# Patient Record
Sex: Male | Born: 1960 | Race: White | Hispanic: No | Marital: Married | Smoking: Never smoker
Health system: Southern US, Community
[De-identification: ages and names within clinical notes are randomized; demographics above are authoritative.]

## PROBLEM LIST (undated history)

## (undated) DIAGNOSIS — F419 Anxiety disorder, unspecified: Secondary | ICD-10-CM

## (undated) DIAGNOSIS — T4145XA Adverse effect of unspecified anesthetic, initial encounter: Secondary | ICD-10-CM

## (undated) DIAGNOSIS — M109 Gout, unspecified: Secondary | ICD-10-CM

## (undated) DIAGNOSIS — T8859XA Other complications of anesthesia, initial encounter: Secondary | ICD-10-CM

## (undated) DIAGNOSIS — I10 Essential (primary) hypertension: Secondary | ICD-10-CM

## (undated) DIAGNOSIS — F319 Bipolar disorder, unspecified: Secondary | ICD-10-CM

## (undated) DIAGNOSIS — G629 Polyneuropathy, unspecified: Secondary | ICD-10-CM

## (undated) DIAGNOSIS — Z9289 Personal history of other medical treatment: Secondary | ICD-10-CM

## (undated) DIAGNOSIS — K219 Gastro-esophageal reflux disease without esophagitis: Secondary | ICD-10-CM

## (undated) DIAGNOSIS — C189 Malignant neoplasm of colon, unspecified: Secondary | ICD-10-CM

## (undated) DIAGNOSIS — E119 Type 2 diabetes mellitus without complications: Secondary | ICD-10-CM

## (undated) HISTORY — DX: Essential (primary) hypertension: I10

## (undated) HISTORY — DX: Malignant neoplasm of colon, unspecified: C18.9

## (undated) HISTORY — DX: Polyneuropathy, unspecified: G62.9

## (undated) HISTORY — DX: Gastro-esophageal reflux disease without esophagitis: K21.9

## (undated) HISTORY — DX: Bipolar disorder, unspecified: F31.9

---

## 2011-07-11 ENCOUNTER — Ambulatory Visit (INDEPENDENT_AMBULATORY_CARE_PROVIDER_SITE_OTHER): Payer: BC Managed Care – PPO | Admitting: Surgery

## 2013-05-02 ENCOUNTER — Other Ambulatory Visit: Payer: Self-pay | Admitting: Family Medicine

## 2013-05-02 ENCOUNTER — Ambulatory Visit
Admission: RE | Admit: 2013-05-02 | Discharge: 2013-05-02 | Disposition: A | Discharge status: Home / Self Care | Payer: BC Managed Care – PPO | Source: Ambulatory Visit | Attending: Family Medicine | Admitting: Family Medicine

## 2013-05-02 DIAGNOSIS — K439 Ventral hernia without obstruction or gangrene: Secondary | ICD-10-CM

## 2013-05-02 DIAGNOSIS — K469 Unspecified abdominal hernia without obstruction or gangrene: Secondary | ICD-10-CM

## 2013-05-02 MED ORDER — IOHEXOL 300 MG/ML  SOLN: 125.0000 mL | Freq: Once | INTRAMUSCULAR | Status: AC | PRN

## 2013-05-10 ENCOUNTER — Encounter (INDEPENDENT_AMBULATORY_CARE_PROVIDER_SITE_OTHER): Payer: Self-pay | Admitting: General Surgery

## 2013-05-10 ENCOUNTER — Ambulatory Visit (INDEPENDENT_AMBULATORY_CARE_PROVIDER_SITE_OTHER): Payer: Federal, State, Local not specified - PPO | Admitting: General Surgery

## 2013-05-10 VITALS — BP 178/98 | HR 70 | Temp 96.9°F | Resp 15 | Ht 70.0 in | Wt 297.2 lb

## 2013-05-10 DIAGNOSIS — K439 Ventral hernia without obstruction or gangrene: Secondary | ICD-10-CM

## 2013-05-10 NOTE — Progress Notes (Signed)
Patient ID: John Hubbard, male   DOB: 08-Dec-1960, 52 y.o.   MRN: [id number]  Chief Complaint  Patient presents with  . New Evaluation    eval abd wall hernia    HPI John Hubbard is a 52 y.o. male.  The patient is a 52 year old male who is referred by Dr. Clarene Duke secondary to incarcerated umbilical hernia as well as some rectal bleeding. The patient states that he's had the hernia for several months. He states most recently it became cellulitic at the umbilicus.he was started on a on a trial of Keflex which helped.  The patient also underwent a CT scan which revealed an incarcerated umbilical hernia x2.  Of note patient also states that he has had some rectal bleeding. This is been put off as hemorrhoids. The patient has not undergone a screening colonoscopy since turning 48. He has no family history of cancer family.    HPI  History reviewed. No pertinent past medical history.  History reviewed. No pertinent past surgical history.  History reviewed. No pertinent family history.  Social History History  Substance Use Topics  . Smoking status: Never Smoker   . Smokeless tobacco: Never Used  . Alcohol Use: Yes     Comment: couple times weekly    Allergies  Allergen Reactions  . Penicillins Hives    Current Outpatient Prescriptions  Medication Sig Dispense Refill  . carbamazepine (TEGRETOL) 200 MG tablet Take 200 mg by mouth 2 (two) times daily.      . cephALEXin (KEFLEX) 500 MG capsule Take 500 mg by mouth 4 (four) times daily.      . colchicine (COLCRYS) 0.6 MG tablet Take 0.6 mg by mouth daily.       No current facility-administered medications for this visit.    Review of Systems Review of Systems  Constitutional: Negative.   HENT: Negative.   Respiratory: Negative.   Cardiovascular: Negative.   Gastrointestinal: Negative.   Neurological: Negative.   Hematological: Negative.   Psychiatric/Behavioral: Negative.   All other systems reviewed and are  negative.    Blood pressure 178/98, pulse 70, temperature 96.9 F (36.1 C), temperature source Temporal, resp. rate 15, height 5\' 10"  (1.778 m), weight 297 lb 3.2 oz (134.809 kg).  Physical Exam Physical Exam  Constitutional: He is oriented to person, place, and time. He appears well-developed and well-nourished.  HENT:  Head: Normocephalic and atraumatic.  Eyes: Conjunctivae and EOM are normal. Pupils are equal, round, and reactive to light.  Neck: Normal range of motion. Neck supple.  Cardiovascular: Normal rate, regular rhythm and normal heart sounds.   Pulmonary/Chest: Effort normal and breath sounds normal.  Abdominal: Bowel sounds are normal. A hernia is present. Hernia confirmed positive in the ventral area.    2 ventral hernias incarcerated  Musculoskeletal: Normal range of motion.  Neurological: He is alert and oriented to person, place, and time.  Skin: Skin is warm and dry.    Data Reviewed none  Assessment    This 52 year old male  With a symptomatic incarcerated ventral hernia x2. Patient also with rectal bleeding.     Plan    1. We'll proceed to the operating room for laparoscopic ventral hernia repair with mesh. 2.All risks and benefits were discussed with the patient, to generally include infection, bleeding, damage to surrounding structures, acute and chronic nerve pain, and recurrence. Alternatives were offered and described.  All questions were answered and the patient voiced understanding of the procedure and wishes to proceed  at this point. 3. We'll have the patient follow up with Dr. Maisie Fus for consultation of a screening colonoscopy.        Marigene Ehlers., John Hubbard 05/10/2013, 4:44 PM

## 2013-05-13 ENCOUNTER — Telehealth (INDEPENDENT_AMBULATORY_CARE_PROVIDER_SITE_OTHER): Payer: Self-pay

## 2013-05-13 NOTE — Telephone Encounter (Signed)
Pt called stating he was contacted [id number] up another consult with our office. Pt states he already had consult. From review of Dr Derrell Lolling note pt was to also have colonoscopy by Dr Maisie Fus in the future. I did not see note of who in our office was calling pt. Pt states he did not get name. Pt was understanding they would proceed with hernia repair and do colonoscopy at a later date. I advised pt I will send msg to Assistant that was working with Dr Derrell Lolling Friday and to [facility] to determine if pt is to have colonoscopy prior to hernia repair. Pt can be reached [phone].

## 2013-05-13 NOTE — Telephone Encounter (Signed)
Returned patient's call to let him know that since all the OR scheduling offices were closed on Friday that they would be calling him back either today or tomorrow to set up the surgery date and to clarify that the appt with [facility] for colonoscopy consult would not affect his hernia surgery being schedule per [facility].Marland Kitchentold patient that he could call and ask for me if he hadn't heard anything from the schedulers as of tomorrow...patient was pleased and agreeable with POC at this time.Marland Kitchen

## 2013-05-16 ENCOUNTER — Ambulatory Visit (INDEPENDENT_AMBULATORY_CARE_PROVIDER_SITE_OTHER): Payer: BC Managed Care – PPO | Admitting: General Surgery

## 2013-05-20 ENCOUNTER — Telehealth (INDEPENDENT_AMBULATORY_CARE_PROVIDER_SITE_OTHER): Payer: Self-pay | Admitting: General Surgery

## 2013-05-20 NOTE — Telephone Encounter (Signed)
Per patient he will bring his insurance card by so that we can bill insurance and set up surgery at that time.

## 2013-06-05 ENCOUNTER — Ambulatory Visit (INDEPENDENT_AMBULATORY_CARE_PROVIDER_SITE_OTHER): Payer: Federal, State, Local not specified - PPO | Admitting: General Surgery

## 2013-06-05 ENCOUNTER — Encounter (INDEPENDENT_AMBULATORY_CARE_PROVIDER_SITE_OTHER): Payer: Self-pay | Admitting: General Surgery

## 2013-06-05 VITALS — BP 138/82 | HR 96 | Resp 18 | Ht 70.0 in | Wt 295.8 lb

## 2013-06-05 DIAGNOSIS — Z1211 Encounter for screening for malignant neoplasm of colon: Secondary | ICD-10-CM

## 2013-06-05 NOTE — Patient Instructions (Signed)
CENTRAL  SURGERY  ONE-DAY (1) PRE-OP HOME COLON PREP INSTRUCTIONS: ** MIRALAX / GATORADE PREP **  You must follow the instructions below carefully.  If you have questions or problems, please call and speak to someone in the clinic department at our office:   [phone].     INSTRUCTIONS: 1. Five days prior to your procedure do not eat nuts, popcorn, or fruit with seeds.  Stop all fiber supplements such as Metamucil, Citrucel, etc. 2. Two days before surgery fill the prescription at a pharmacy of your choice and purchase the additional supplies below.         MIRALAX - GATORADE -- DULCOLAX TABS:   Purchase a bottle of MIRALAX  (255 gm bottle)    In addition, purchase four (4) DULCOLAX TABLETS (no prescription required- ask the pharmacist if you can't find them)    Purchase one 64 oz GATORADE.  (Do NOT purchase red Gatorade; any other flavor is acceptable) and place in refrigerator to get cold.  3.   Day Before Surgery:   6 am: take the 4 Dulcolax tablets   You may only have clear liquids (tea, coffee, juice, broth, jello, soft drinks, gummy bears).  You cannot have solid foods, cream, milk or milk products.  Drink at lease 8 ounces of liquids every hour while awake.   Mix the entire bottle of MiraLax and the Gatorade in a large container.    10:00am: Begin drinking the Gatorade mixture until gone (8 oz every 15-30 minutes).      You may suck on a lime wedge or hard candy to "freshen your palate" in between glasses   If you are a diabetic, take your blood sugar reading several time throughout the prep.  Have some juice available to take if your sugar level gets too low   You may feel chilled while taking the prep.  Have some warm tea or broth to help warm up.   Continue clear liquids until midnight or bedtime  3. The day of your procedure:   Do not eat or drink ANYTHING after midnight before your surgery.     If you take Heart or Blood Pressure medicine, ask the pre-op nurses about  these during your preop appointment.   Further pre-operative instructions will be given to you from the hospital.   Expect to be contacted 5-7 days before your surgery.  

## 2013-06-05 NOTE — Progress Notes (Signed)
Chief Complaint  Patient presents with  . New Evaluation    referal by Dr Alinda Sierras talk about colonoscopy    HISTORY: John Hubbard is a 52 y.o. male who presents to the office requesting a screening colonoscopy.  He has regular BM's and does occasionally see some blood around his stools.    past medical history.  Gout, hernia  History reviewed. No pertinent past surgical history.      Current Outpatient Prescriptions  Medication Sig Dispense Refill  . allopurinol (ZYLOPRIM) 300 MG tablet       . carbamazepine (TEGRETOL) 200 MG tablet Take 200 mg by mouth 2 (two) times daily.      . cephALEXin (KEFLEX) 500 MG capsule Take 500 mg by mouth 4 (four) times daily.      . colchicine (COLCRYS) 0.6 MG tablet Take 0.6 mg by mouth daily.       No current facility-administered medications for this visit.      Allergies  Allergen Reactions  . Penicillins Hives      History reviewed. No pertinent family history. No h/o colon cancer History   Social History  . Marital Status: Married    Spouse Name: N/A    Number of Children: N/A  . Years of Education: N/A   Social History Main Topics  . Smoking status: Never Smoker   . Smokeless tobacco: Never Used  . Alcohol Use: Yes     Comment: couple times weekly  . Drug Use: No  . Sexual Activity: None   Other Topics Concern  . None   Social History Narrative  . None      REVIEW OF SYSTEMS - PERTINENT POSITIVES ONLY: Review of Systems - General ROS: negative for - chills, fever or weight loss Hematological and Lymphatic ROS: negative for - bleeding problems, blood clots or bruising Respiratory ROS: no cough, shortness of breath, or wheezing Cardiovascular ROS: no chest pain or dyspnea on exertion Gastrointestinal ROS: positive for - blood around stools negative for - abdominal pain, change in bowel habits, change in stools, constipation or diarrhea Genito-Urinary ROS: no dysuria, trouble voiding, or hematuria  EXAM: Filed Vitals:   06/05/13 1351  BP: 138/82  Pulse: 96  Resp: 18    General appearance: alert and cooperative Resp: clear to auscultation bilaterally Cardio: regular rate and rhythm, S1, S2 normal, no murmur, click, rub or gallop and regular rate and rhythm GI: soft, non-tender; bowel sounds normal; no masses,  no organomegaly      ASSESSMENT AND PLAN: John Hubbard is a 52 y.o. M with average risk for colon cancer.  We will plan on performing a screening colonoscopy.    Vanita Panda, MD Colon and Rectal Surgery / General Surgery [facility]      Visit Diagnoses: 1. Screening for colon cancer     Primary Care Physician: Aida Puffer, MD

## 2013-06-06 ENCOUNTER — Telehealth (INDEPENDENT_AMBULATORY_CARE_PROVIDER_SITE_OTHER): Payer: Self-pay | Admitting: General Surgery

## 2013-06-06 ENCOUNTER — Ambulatory Visit (INDEPENDENT_AMBULATORY_CARE_PROVIDER_SITE_OTHER): Payer: Self-pay | Admitting: General Surgery

## 2013-06-06 NOTE — Telephone Encounter (Signed)
Patient face sheet being held in pending until appointment with Dr. Toney Reil

## 2013-06-14 ENCOUNTER — Telehealth (INDEPENDENT_AMBULATORY_CARE_PROVIDER_SITE_OTHER): Payer: Self-pay | Admitting: *Deleted

## 2013-06-14 NOTE — Telephone Encounter (Signed)
I s/w Mr. Strine.  I told him we'll just keep his surgery scheudled and see how he is the AM of surgery.  HE was OK with that and will con' with his Albuterol tx.

## 2013-06-14 NOTE — Telephone Encounter (Signed)
Patient called this morning to report that he has been having some issues with bronchitis for the last few days.  Patient is treating it with Albuterol and does feel he is getting better over the last 2 days.  Patient is scheduled for Umbilical hernia on 06/19/13 and just wanted to make sure this would not effect his surgery. Explained a message will be sent to Dr. Derrell Lolling to ask his opinion on this then we will call him back.  Patient states understanding and agreeable at this time.

## 2013-06-19 ENCOUNTER — Other Ambulatory Visit (INDEPENDENT_AMBULATORY_CARE_PROVIDER_SITE_OTHER): Payer: Self-pay | Admitting: *Deleted

## 2013-06-19 DIAGNOSIS — K436 Other and unspecified ventral hernia with obstruction, without gangrene: Secondary | ICD-10-CM

## 2013-06-19 HISTORY — PX: HERNIA REPAIR: SHX51

## 2013-06-19 MED ORDER — OXYCODONE-ACETAMINOPHEN 5-325 MG PO TABS: 1.0000 | ORAL_TABLET | ORAL | Status: DC | PRN

## 2013-06-20 ENCOUNTER — Telehealth (INDEPENDENT_AMBULATORY_CARE_PROVIDER_SITE_OTHER): Payer: Self-pay

## 2013-06-20 NOTE — Telephone Encounter (Signed)
Dr Derrell Lolling did his surgery.  I will get the message to him.

## 2013-06-20 NOTE — Telephone Encounter (Signed)
Patient wife called in stating there was a little discharge from incision wound. The drainage was clear with a little blood. It is not oozing out but is just having spots of drainage on guaze. Patient denies any n/v, fever, redness or abnormal swelling. Drainage seems to come when patient coughs or moves a lot. They will continue to monitor area and call back if drainage increases or pus starts to drain from area. They will call is patient starts running fever, or has n/v.

## 2013-06-21 NOTE — Telephone Encounter (Signed)
Patient called in today to report that he has bronchitis and has been coughing really hard.  Patient states he thinks he "popped" a stitch in the lower incision site.  Spoke to Dr. Derrell Lolling who instructed patient to just keep a close eye on it and call if he see's any signs of pus drainage, redness, or fever.  Also encouraged patient to use a pillow to splint the abdomen with when coughing.  Patient states understanding and agreeable at this time.

## 2013-07-03 ENCOUNTER — Other Ambulatory Visit (INDEPENDENT_AMBULATORY_CARE_PROVIDER_SITE_OTHER): Payer: Self-pay | Admitting: General Surgery

## 2013-07-03 ENCOUNTER — Encounter (INDEPENDENT_AMBULATORY_CARE_PROVIDER_SITE_OTHER): Payer: Self-pay | Admitting: General Surgery

## 2013-07-03 ENCOUNTER — Ambulatory Visit (INDEPENDENT_AMBULATORY_CARE_PROVIDER_SITE_OTHER): Payer: Federal, State, Local not specified - PPO | Admitting: General Surgery

## 2013-07-03 VITALS — BP 132/82 | HR 77 | Resp 16 | Ht 70.0 in | Wt 296.8 lb

## 2013-07-03 DIAGNOSIS — Z09 Encounter for follow-up examination after completed treatment for conditions other than malignant neoplasm: Secondary | ICD-10-CM

## 2013-07-03 NOTE — Progress Notes (Signed)
Patient ID: John Hubbard, male   DOB: [birth year], 52 y.o.   MRN: [id number] The patient is a 52 year old male status post laparoscopic umbilical hernia repair with mesh. The patient has been doing well postoperatively. She's had a nagging cough is slowly getting over at this time. He states he noticed some soreness in his abdomen is feeling better. He states that he is beginning to walk at this time and beginning to increase his activities.   On exam: Wounds are clean dry and intact, and there is no hernia on palpation   Assessment and plan: 52 year old male status post laparoscopic hernia repair mesh 1. We discussed no heavy lifting for another month. 2. The patient follow up as needed 3. We'll have patient set up with Dr. Maisie Fus for screening colonoscopy.

## 2013-07-24 ENCOUNTER — Encounter (HOSPITAL_COMMUNITY)
Admission: RE | Disposition: A | Discharge status: Home / Self Care | Payer: Self-pay | Source: Ambulatory Visit | Attending: General Surgery

## 2013-07-24 ENCOUNTER — Ambulatory Visit (HOSPITAL_COMMUNITY)
Admission: RE | Admit: 2013-07-24 | Discharge: 2013-07-24 | Disposition: A | Discharge status: Home / Self Care | Payer: Federal, State, Local not specified - PPO | Source: Ambulatory Visit | Attending: General Surgery | Admitting: General Surgery

## 2013-07-24 ENCOUNTER — Encounter (HOSPITAL_COMMUNITY): Payer: Self-pay | Admitting: *Deleted

## 2013-07-24 DIAGNOSIS — D126 Benign neoplasm of colon, unspecified: Secondary | ICD-10-CM

## 2013-07-24 DIAGNOSIS — C187 Malignant neoplasm of sigmoid colon: Secondary | ICD-10-CM

## 2013-07-24 DIAGNOSIS — Z79899 Other long term (current) drug therapy: Secondary | ICD-10-CM | POA: Insufficient documentation

## 2013-07-24 DIAGNOSIS — K625 Hemorrhage of anus and rectum: Secondary | ICD-10-CM | POA: Insufficient documentation

## 2013-07-24 HISTORY — PX: COLONOSCOPY: SHX5424

## 2013-07-24 SURGERY — COLONOSCOPY
Anesthesia: Moderate Sedation | Laterality: Bilateral

## 2013-07-24 MED ORDER — METOPROLOL TARTRATE 1 MG/ML IV SOLN
5.0000 mg | INTRAVENOUS | Status: AC
  Administered 2013-07-24: 5 mg via INTRAVENOUS

## 2013-07-24 MED ORDER — SPOT INK MARKER SYRINGE KIT
PACK | SUBMUCOSAL | Status: DC | PRN
  Administered 2013-07-24: 4 mL via SUBMUCOSAL

## 2013-07-24 MED ORDER — FENTANYL CITRATE 0.05 MG/ML IJ SOLN
INTRAMUSCULAR | Status: AC
  Filled 2013-07-24: qty 2

## 2013-07-24 MED ORDER — FENTANYL CITRATE 0.05 MG/ML IJ SOLN
INTRAMUSCULAR | Status: DC | PRN
  Administered 2013-07-24: 25 ug via INTRAVENOUS
  Administered 2013-07-24 (×3): 12.5 ug via INTRAVENOUS
  Administered 2013-07-24: 25 ug via INTRAVENOUS
  Administered 2013-07-24: 12.5 ug via INTRAVENOUS

## 2013-07-24 MED ORDER — MIDAZOLAM HCL 10 MG/2ML IJ SOLN
INTRAMUSCULAR | Status: DC | PRN
  Administered 2013-07-24: .5 mg via INTRAVENOUS
  Administered 2013-07-24: 2 mg via INTRAVENOUS
  Administered 2013-07-24: .5 mg via INTRAVENOUS
  Administered 2013-07-24: 2 mg via INTRAVENOUS

## 2013-07-24 MED ORDER — SPOT INK MARKER SYRINGE KIT
PACK | SUBMUCOSAL | Status: AC
  Filled 2013-07-24: qty 5

## 2013-07-24 MED ORDER — MIDAZOLAM HCL 10 MG/2ML IJ SOLN
INTRAMUSCULAR | Status: AC
  Filled 2013-07-24: qty 2

## 2013-07-24 MED ORDER — METOPROLOL TARTRATE 1 MG/ML IV SOLN
INTRAVENOUS | Status: AC
  Filled 2013-07-24: qty 5

## 2013-07-24 MED ORDER — SODIUM CHLORIDE 0.9 % IV SOLN
INTRAVENOUS | Status: DC
  Administered 2013-07-24 (×2): 500 mL via INTRAVENOUS

## 2013-07-24 MED ORDER — GLUCAGON HCL (RDNA) 1 MG IJ SOLR
INTRAMUSCULAR | Status: AC
  Filled 2013-07-24: qty 1

## 2013-07-24 NOTE — H&P (Signed)
  HISTORY: John Hubbard is a 52 y.o. male who presented to the office requesting a screening colonoscopy. He has regular BM's and does occasionally see some blood around his stools. This has been better since his hernia repair.    past medical history. Gout, hernia   past surgical history.  IHR  Current Outpatient Prescriptions   Medication  Sig  Dispense  Refill   .  allopurinol (ZYLOPRIM) 300 MG tablet      .  carbamazepine (TEGRETOL) 200 MG tablet  Take 200 mg by mouth 2 (two) times daily.     .  cephALEXin (KEFLEX) 500 MG capsule  Take 500 mg by mouth 4 (four) times daily.     .  colchicine (COLCRYS) 0.6 MG tablet  Take 0.6 mg by mouth daily.      No current facility-administered medications for this visit.    Allergies   Allergen  Reactions   .  Penicillins  Hives    History reviewed. No pertinent family history.  No h/o colon cancer  History    Social History   .  Marital Status:  Married     Spouse Name:  N/A     Number of Children:  N/A   .  Years of Education:  N/A    Social History Main Topics   .  Smoking status:  Never Smoker   .  Smokeless tobacco:  Never Used   .  Alcohol Use:  Yes      Comment: couple times weekly   .  Drug Use:  No   .  Sexual Activity:  None    Other Topics  Concern   .  None    Social History Narrative   .  None    REVIEW OF SYSTEMS - PERTINENT POSITIVES ONLY:  Review of Systems - General ROS: negative for - chills, fever or weight loss  Hematological and Lymphatic ROS: negative for - bleeding problems, blood clots or bruising  Respiratory ROS: no cough, shortness of breath, or wheezing  Cardiovascular ROS: no chest pain or dyspnea on exertion  Gastrointestinal ROS: positive for - blood around stools  negative for - abdominal pain, change in bowel habits, change in stools, constipation or diarrhea  Genito-Urinary ROS: no dysuria, trouble voiding, or hematuria \  EXAM:   General appearance: alert and cooperative  Resp: clear  to auscultation bilaterally  Cardio: regular rate and rhythm, S1, S2 normal, no murmur, click, rub or gallop and regular rate and rhythm  GI: soft, non-tender; bowel sounds normal; no masses, no organomegaly   ASSESSMENT AND PLAN:  John Hubbard is a 52 y.o. M with average risk for colon cancer. We will plan on performing a screening colonoscopy.  Risks and benefits discussed.  Risks include bleeding and perforation.

## 2013-07-24 NOTE — Op Note (Signed)
[facility] [address] [place], [address]   COLONOSCOPY PROCEDURE REPORT  PATIENT: Kouper, Spinella  MR#: [id number] BIRTHDATE: 09/09/1961 , 52  yrs. old GENDER: Male ENDOSCOPIST: Vanita Panda, MD REFERRED NW:GNFAO Little, M.D. PROCEDURE DATE:  07/24/2013 PROCEDURE:   Colonoscopy, diagnostic ASA CLASS:   Class II INDICATIONS:Rectal Bleeding. MEDICATIONS: Fentanyl 100 mcg IV and Versed 5 mg IV  DESCRIPTION OF PROCEDURE:   After the risks benefits and alternatives of the procedure were thoroughly explained, informed consent was obtained.  A digital rectal exam revealed no rectal mass.   The Pentax Adult Colonscope [id number]  endoscope was introduced through the anus and advanced to the cecum, which was identified by both the appendix and ileocecal valve. No adverse events experienced.   The quality of the prep was good, using MiraLax  The instrument was then slowly withdrawn as the colon was fully examined.    Findings: Sigmoid mass concerning for cancer.  Multiple polyps.  COLON FINDINGS: A polypoid shaped pedunculated polyp measuring 5 mm in size was found at the cecum.  A polypectomy was performed using snare cautery.  The resection was complete (piecemeal) and the polyp tissue was completely retrieved.   A polypoid shaped pedunculated polyp measuring 3 mm in size was found in the left colon.  This was not biopsied because it was 5 cm distal to his sigmoid mass.   A one-third circumferential ulcerated and firm mass, measuring 3 X 5cm in size, was found in the sigmoid colon. Multiple biopsies of the lesion were performed using cold forceps. Multiple polypoid shaped pedunculated polyps ranging between 3-18mm in size were found in the descending colon.  A polypectomy was performed using snare cautery for all 3.  The resection was complete and the polyp tissue was completely retrieved. 2 of these were retrieved with the basket.  The sigmoid mass was marked  with SPOT in multiple locations proximal to sigmoid mass and distal to sigmoid polyp. Retroflexed views revealed no abnormalities. Withdrawal time was 70 minutes.  The scope was withdrawn and the procedure completed.  ENDOSCOPIC IMPRESSION:     1.   Pedunculated polyp measuring 5 mm in size was found at the cecum; polypectomy was performed using snare cautery 2.   Pedunculated polyp measuring 3 mm in size was found in the left colon 3.   One-third circumferential mass, measuring 3 X 5cm in size, were found in the sigmoid colon; multiple biopsies of the lesion were performed 4.   Multiple pedunculated polyps ranging between 3-45mm in size were found in the descending colon; polypectomy was performed using snare cautery   RECOMMENDATIONS:     Await biopsy results   eSigned:  Vanita Panda, MD 07/24/2013 1:06 PM   cc: Aida Puffer, MD   PATIENT NAME:  Niguel, Moure MR#: [id number]

## 2013-07-25 ENCOUNTER — Encounter (HOSPITAL_COMMUNITY): Payer: Self-pay | Admitting: General Surgery

## 2013-07-25 ENCOUNTER — Other Ambulatory Visit (INDEPENDENT_AMBULATORY_CARE_PROVIDER_SITE_OTHER): Payer: Self-pay | Admitting: General Surgery

## 2013-07-25 ENCOUNTER — Telehealth (INDEPENDENT_AMBULATORY_CARE_PROVIDER_SITE_OTHER): Payer: Self-pay | Admitting: General Surgery

## 2013-07-25 ENCOUNTER — Telehealth (INDEPENDENT_AMBULATORY_CARE_PROVIDER_SITE_OTHER): Payer: Self-pay | Admitting: *Deleted

## 2013-07-25 DIAGNOSIS — C189 Malignant neoplasm of colon, unspecified: Secondary | ICD-10-CM

## 2013-07-25 NOTE — Telephone Encounter (Signed)
I called pt and informed him of his appt for his CT scan at [facility] on 07/29/13 with an arrival time of 2:45pm.  I instructed pt to go today or tomorrow to pick up the contrast and they would instruct him on when to drink.  Also I informed pt that [facility] had moved pts appt with Dr. Maisie Fus to 07/31/13 at 9:40am.  Pt agreeable.

## 2013-07-25 NOTE — Progress Notes (Signed)
Tests to be arranged and appointment moved up

## 2013-07-25 NOTE — Telephone Encounter (Signed)
Discussed results with patient.  Will proceed with metastatic workup.

## 2013-07-26 ENCOUNTER — Telehealth (INDEPENDENT_AMBULATORY_CARE_PROVIDER_SITE_OTHER): Payer: Self-pay

## 2013-07-26 NOTE — Telephone Encounter (Signed)
Yes, I'd like to get a repeat Abd/pelvis CT with the Chest CT

## 2013-07-26 NOTE — Telephone Encounter (Signed)
I called and notified the pt.  We will keep it scheduled for Monday

## 2013-07-26 NOTE — Telephone Encounter (Signed)
The pt called to report he just had a ct scan and wanted to make sure Dr Maisie Fus knows that and does he still need to get one Monday.  I reviewed his chart and the CT was in July.  It has been 3 months and there has been a change so I told him she would want it.  If Dr Maisie Fus decides she doesn't I said it was the abdomen and pelvis so we will still need the chest done.

## 2013-07-29 ENCOUNTER — Encounter (HOSPITAL_COMMUNITY): Payer: Self-pay

## 2013-07-29 ENCOUNTER — Ambulatory Visit (HOSPITAL_COMMUNITY)
Admission: RE | Admit: 2013-07-29 | Discharge: 2013-07-29 | Disposition: A | Discharge status: Home / Self Care | Payer: Federal, State, Local not specified - PPO | Source: Ambulatory Visit | Attending: General Surgery | Admitting: General Surgery

## 2013-07-29 DIAGNOSIS — C189 Malignant neoplasm of colon, unspecified: Secondary | ICD-10-CM

## 2013-07-29 DIAGNOSIS — C187 Malignant neoplasm of sigmoid colon: Secondary | ICD-10-CM | POA: Insufficient documentation

## 2013-07-29 MED ORDER — IOHEXOL 300 MG/ML  SOLN
100.0000 mL | Freq: Once | INTRAMUSCULAR | Status: AC | PRN
  Administered 2013-07-29: 100 mL via INTRAVENOUS

## 2013-07-30 ENCOUNTER — Other Ambulatory Visit: Payer: Federal, State, Local not specified - PPO

## 2013-07-31 ENCOUNTER — Encounter (INDEPENDENT_AMBULATORY_CARE_PROVIDER_SITE_OTHER): Payer: Self-pay | Admitting: General Surgery

## 2013-07-31 ENCOUNTER — Other Ambulatory Visit (INDEPENDENT_AMBULATORY_CARE_PROVIDER_SITE_OTHER): Payer: Self-pay | Admitting: *Deleted

## 2013-07-31 ENCOUNTER — Ambulatory Visit (INDEPENDENT_AMBULATORY_CARE_PROVIDER_SITE_OTHER): Payer: Federal, State, Local not specified - PPO | Admitting: General Surgery

## 2013-07-31 VITALS — BP 160/98 | HR 88 | Temp 97.6°F | Resp 16 | Ht 70.0 in | Wt 299.8 lb

## 2013-07-31 DIAGNOSIS — C189 Malignant neoplasm of colon, unspecified: Secondary | ICD-10-CM

## 2013-07-31 MED ORDER — METRONIDAZOLE 500 MG PO TABS: 500.0000 mg | ORAL_TABLET | ORAL | Status: DC

## 2013-07-31 NOTE — Patient Instructions (Signed)
CENTRAL [facility]  ONE-DAY (1) PRE-OP HOME COLON PREP INSTRUCTIONS: ** MIRALAX / GATORADE PREP / FLAGYL**  You must follow the instructions below carefully.  If you have questions or problems, please call and speak to someone in the clinic department at our office:   [phone].     INSTRUCTIONS: 1. Five days prior to your procedure do not eat nuts, popcorn, or fruit with seeds.  Stop all fiber supplements such as Metamucil, Citrucel, etc. 2. Two days before surgery fill the prescription at a pharmacy of your choice and purchase the additional supplies below.         MIRALAX - GATORADE -- DULCOLAX TABS -- FLEET ENEMA:   Purchase a bottle of MIRALAX  (255 gm bottle)    In addition, purchase four (4) DULCOLAX TABLETS (no prescription required- ask the pharmacist if you can't find them)   Purchase one Fleet Enema (Green and white box)    Purchase one 64 oz GATORADE.  (Do NOT purchase red Gatorade; any other flavor is acceptable) and place in refrigerator to get cold.  3.   Day Before Surgery:   6 am: Wash you abdomen with soap and repeat this on the morning of surgery and take 4 Dulcolax tablets   You may only have clear liquids (tea, coffee, juice, broth, jello, soft drinks, gummy bears).  You cannot have solid foods, cream, milk or milk products.  Drink at lease 8 ounces of liquids every hour while awake.   Take the Flagyl prescription as directed at 8 am, 2 pm and 8 pm.  It is helpful to take this with some jello instead of on an empty stomach.  Any flavor is ok, except red jello, which will cause red stools.   Mix the entire bottle of MiraLax and the Gatorade in a large container.    10:00am: Begin drinking the Gatorade mixture until gone (8 oz every 15-30 minutes).      You may suck on a lime wedge or hard candy to "freshen your palate" in between glasses   If you are a diabetic, take your blood sugar reading several time throughout the prep.  Have some juice available to take if your  sugar level gets too low   You may feel chilled while taking the prep.  Have some warm tea or broth to help warm up.   Continue clear liquids until midnight or bedtime  3. The day of your procedure:   Do not eat or drink ANYTHING after midnight before your surgery.     If you take Heart or Blood Pressure medicine, ask the pre-op nurses about these during your preop appointment.   Take a regular Fleet Enema one hour before leaving the come to the hospital.  Try to hold it in 5-10 mins before expelling.   Further pre-operative instructions will be given to you from the hospital.   Expect to be contacted 5-7 days before your surgery.     

## 2013-07-31 NOTE — Progress Notes (Signed)
Chief Complaint  Patient presents with  . Routine Post Op    1st po colonoscopy    HISTORY: John Hubbard is a 52 y.o. male who presents to the office with rectal bleeding.  This was found colonoscopy performed for rectal bleeding.  Workup thus far has included CT chest, Abd and Pelvis.   He has a CEA pending.  He denies any cardiac issues.  He can walk up a flight of stairs with DOE.  He denies any chest pain.  He does have signs concerning for OSA.  Past Medical History  Diagnosis Date  . Hypertension       Past Surgical History  Procedure Laterality Date  . Hernia repair  06/19/13    Umbilical Hernia Repair  . Colonoscopy Bilateral 07/24/2013    Procedure: COLONOSCOPY;  Surgeon: Romie Levee, MD;  Location: [facility];  Service: Endoscopy;  Laterality: Bilateral;      Current Outpatient Prescriptions  Medication Sig Dispense Refill  . allopurinol (ZYLOPRIM) 300 MG tablet       . carbamazepine (TEGRETOL) 200 MG tablet Take 200 mg by mouth 2 (two) times daily.      . metroNIDAZOLE (FLAGYL) 500 MG tablet Take 1 tablet (500 mg total) by mouth as directed.  3 tablet  0   No current facility-administered medications for this visit.      Allergies  Allergen Reactions  . Penicillins Hives      History reviewed. No pertinent family history.    History   Social History  . Marital Status: Married    Spouse Name: N/A    Number of Children: N/A  . Years of Education: N/A   Social History Main Topics  . Smoking status: Never Smoker   . Smokeless tobacco: Never Used  . Alcohol Use: Yes     Comment: couple times weekly  . Drug Use: No  . Sexual Activity: None   Other Topics Concern  . None   Social History Narrative  . None       REVIEW OF SYSTEMS - PERTINENT POSITIVES ONLY: Review of Systems - General ROS: negative for - chills or fever Respiratory ROS: no cough, shortness of breath, or wheezing Cardiovascular ROS: no chest pain or dyspnea on  exertion Gastrointestinal ROS: no abdominal pain, change in bowel habits, or black or bloody stools Genito-Urinary ROS: no dysuria, trouble voiding, or hematuria  EXAM: Filed Vitals:   07/31/13 0955  BP: 160/98  Pulse: 88  Temp: 97.6 F (36.4 C)  Resp: 16      Gen:  No acute distress.  Well nourished and well groomed.   Neurological: Alert and oriented to person, place, and time. Coordination normal.  Head: Normocephalic and atraumatic.  Eyes: Conjunctivae are normal. Pupils are equal, round, and reactive to light. No scleral icterus.  Neck: Normal range of motion. Neck supple. No tracheal deviation or thyromegaly present.  No cervical lymphadenopathy. Cardiovascular: Normal rate, regular rhythm, normal heart sounds and intact distal pulses.   Respiratory: Effort normal.  No respiratory distress. No chest wall tenderness. Breath sounds normal.  No wheezes, rales or rhonchi.  GI: Soft. Bowel sounds are normal. The abdomen is soft and nontender.  There is no rebound and no guarding.  Musculoskeletal: Normal range of motion. Extremities are nontender.  Skin: Skin is warm and dry. No rash noted. No diaphoresis. No erythema. No pallor. No clubbing, cyanosis, or edema.   Psychiatric: Normal mood and affect. Behavior is normal. Judgment and thought  content normal.      LABORATORY RESULTS: Available labs are reviewed  CEA: pending  RADIOLOGY RESULTS:   Images and reports are reviewed. CT Chest Abd and pelvis shows no signs of metastatic disease.     ASSESSMENT AND PLAN: BRENNEN CAMPER is a 52 y.o. 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  This was tattooed distally and proximately.  CT shows no signs of metastatic disease.  The surgery and anatomy were described to the patient as well as the risks of surgery and the possible complications.  These include: Bleeding, infection and possible wound complications such as hernia, damage to adjacent structures,  leak of surgical connections, which can lead to other surgeries and possibly an ostomy (5-7%), possible need for other procedures, such as abscess drains in radiology, possible prolonged hospital stay, possible diarrhea from removal of part of the colon, possible constipation from narcotics, prolonged fatigue/weakness or appetite loss, possible early recurrence of cancer, possible complications of their medical problems such as heart disease or arrhythmias or lung problems, death (less than 1%).  We also discussed that he is at slightly higher risk of hernia due to his weight.  I believe the patient understands and wishes to proceed with the surgery.      Vanita Panda, MD Colon and Rectal Surgery / General Surgery [facility]      Visit Diagnoses: 1. Colon cancer     Primary Care Physician: Aida Puffer, MD

## 2013-08-01 ENCOUNTER — Encounter (INDEPENDENT_AMBULATORY_CARE_PROVIDER_SITE_OTHER): Payer: Federal, State, Local not specified - PPO | Admitting: General Surgery

## 2013-08-01 LAB — CEA: CEA: 1.8 ng/mL (ref 0.0–5.0)

## 2013-08-06 ENCOUNTER — Encounter (INDEPENDENT_AMBULATORY_CARE_PROVIDER_SITE_OTHER): Payer: Federal, State, Local not specified - PPO | Admitting: General Surgery

## 2013-08-06 ENCOUNTER — Encounter (HOSPITAL_COMMUNITY): Payer: Self-pay | Admitting: Pharmacy Technician

## 2013-08-08 ENCOUNTER — Other Ambulatory Visit: Payer: Self-pay

## 2013-08-09 ENCOUNTER — Encounter (HOSPITAL_COMMUNITY): Payer: Self-pay

## 2013-08-09 ENCOUNTER — Encounter (HOSPITAL_COMMUNITY)
Admission: RE | Admit: 2013-08-09 | Discharge: 2013-08-09 | Disposition: A | Discharge status: Home / Self Care | Payer: Federal, State, Local not specified - PPO | Source: Ambulatory Visit | Attending: General Surgery | Admitting: General Surgery

## 2013-08-09 ENCOUNTER — Other Ambulatory Visit (HOSPITAL_COMMUNITY): Payer: Self-pay | Admitting: General Surgery

## 2013-08-09 DIAGNOSIS — Z01812 Encounter for preprocedural laboratory examination: Secondary | ICD-10-CM | POA: Insufficient documentation

## 2013-08-09 DIAGNOSIS — Z01810 Encounter for preprocedural cardiovascular examination: Secondary | ICD-10-CM | POA: Insufficient documentation

## 2013-08-09 DIAGNOSIS — Z01818 Encounter for other preprocedural examination: Secondary | ICD-10-CM | POA: Insufficient documentation

## 2013-08-09 HISTORY — DX: Anxiety disorder, unspecified: F41.9

## 2013-08-09 LAB — CBC
HCT: 44.1 % (ref 39.0–52.0)
MCHC: 34.2 g/dL (ref 30.0–36.0)
MCV: 84 fL (ref 78.0–100.0)
RBC: 5.25 MIL/uL (ref 4.22–5.81)
RDW: 12.9 % (ref 11.5–15.5)
WBC: 7.1 10*3/uL (ref 4.0–10.5)

## 2013-08-09 LAB — BASIC METABOLIC PANEL
BUN: 17 mg/dL (ref 6–23)
CO2: 28 mEq/L (ref 19–32)
Chloride: 101 mEq/L (ref 96–112)
Creatinine, Ser: 0.81 mg/dL (ref 0.50–1.35)
Sodium: 138 mEq/L (ref 135–145)

## 2013-08-09 LAB — ABO/RH: ABO/RH(D): O POS

## 2013-08-09 NOTE — Patient Instructions (Signed)
20 DEMAURI ADVINCULA  08/09/2013   Your procedure is scheduled on:  08/14/13  [facility]  Report to [facility] at    0630   AM.  Call this number if you have problems the morning of surgery: [phone]     BOWEL PREP AS PER OFFICE  Remember:   Do not  TAKE ANYTHING BY MOUTH AFTER MIDNIGHT Tuesday NIGHT:   Take these medicines the morning of surgery with A SIP OF WATER:   TEGRETOL   .  Contacts, dentures or partial plates can not be worn to surgery  Leave suitcase in the car. After surgery it may be brought to your room.  For patients admitted to the hospital, checkout time is 11:00 AM day of  discharge.             SPECIAL INSTRUCTIONS- SEE [place] PREPARING FOR SURGERY INSTRUCTION SHEET-     DO NOT WEAR JEWELRY, LOTIONS, POWDERS, OR PERFUMES.  WOMEN-- DO NOT SHAVE LEGS OR UNDERARMS FOR 12 HOURS BEFORE SHOWERS. MEN MAY SHAVE FACE.  Patients discharged the day of surgery will not be allowed to drive home. IF going home the day of surgery, you must have a driver and someone to stay with you for the first 24 hours  Name and phone number of your driver:    ADMISSION                                                                    Please read over the following fact sheets that you were given:  Blood Transfusion Sheet  Information                                                                                   Jericha Bryden  PST [phone]  [phone]                 FAILURE TO FOLLOW THESE INSTRUCTIONS MAY RESULT IN  CANCELLATION   OF YOUR SURGERY                                                  Patient Signature _____________________________

## 2013-08-09 NOTE — Progress Notes (Signed)
08/09/13 1331  OBSTRUCTIVE SLEEP APNEA  Have you ever been diagnosed with sleep apnea through a sleep study? No  Do you snore loudly (loud enough to be heard through closed doors)?  1  Do you often feel tired, fatigued, or sleepy during the daytime? 1  Has anyone observed you stop breathing during your sleep? 1  Do you have, or are you being treated for high blood pressure? 1  BMI more than 35 kg/m2? 1  Age over 54 years old? 1  Neck circumference greater than 40 cm/18 inches? 1  Gender: 1  Obstructive Sleep Apnea Score 8  Score 4 or greater  Results sent to PCP

## 2013-08-09 NOTE — Progress Notes (Signed)
Chest CT 10/14 EPIC, faxed STOP BANG SCREEN to Dr Clarene Duke through [facility].

## 2013-08-13 MED ORDER — GENTAMICIN SULFATE 40 MG/ML IJ SOLN
490.0000 mg | INTRAVENOUS | Status: AC
  Administered 2013-08-14: 490 mg via INTRAVENOUS
  Filled 2013-08-13: qty 12.25

## 2013-08-13 MED ORDER — CLINDAMYCIN PHOSPHATE 900 MG/50ML IV SOLN
900.0000 mg | INTRAVENOUS | Status: AC
  Administered 2013-08-14 (×2): 900 mg via INTRAVENOUS
  Filled 2013-08-13: qty 50

## 2013-08-14 ENCOUNTER — Encounter (HOSPITAL_COMMUNITY)
Admission: RE | Disposition: A | Discharge status: Home / Self Care | Payer: Self-pay | Source: Ambulatory Visit | Attending: General Surgery

## 2013-08-14 ENCOUNTER — Encounter (HOSPITAL_COMMUNITY): Payer: Federal, State, Local not specified - PPO | Admitting: Certified Registered Nurse Anesthetist

## 2013-08-14 ENCOUNTER — Encounter (HOSPITAL_COMMUNITY): Payer: Self-pay | Admitting: *Deleted

## 2013-08-14 ENCOUNTER — Ambulatory Visit (HOSPITAL_COMMUNITY): Payer: Federal, State, Local not specified - PPO | Admitting: Certified Registered Nurse Anesthetist

## 2013-08-14 ENCOUNTER — Inpatient Hospital Stay (HOSPITAL_COMMUNITY)
Admission: RE | Admit: 2013-08-14 | Discharge: 2013-08-19 | DRG: 330 | Disposition: A | Discharge status: Home / Self Care | Payer: Federal, State, Local not specified - PPO | Source: Ambulatory Visit | Attending: General Surgery | Admitting: General Surgery

## 2013-08-14 DIAGNOSIS — C19 Malignant neoplasm of rectosigmoid junction: Secondary | ICD-10-CM

## 2013-08-14 DIAGNOSIS — C187 Malignant neoplasm of sigmoid colon: Principal | ICD-10-CM | POA: Diagnosis present

## 2013-08-14 DIAGNOSIS — E860 Dehydration: Secondary | ICD-10-CM | POA: Diagnosis present

## 2013-08-14 DIAGNOSIS — Z01810 Encounter for preprocedural cardiovascular examination: Secondary | ICD-10-CM

## 2013-08-14 DIAGNOSIS — Z01812 Encounter for preprocedural laboratory examination: Secondary | ICD-10-CM

## 2013-08-14 DIAGNOSIS — Z79899 Other long term (current) drug therapy: Secondary | ICD-10-CM

## 2013-08-14 DIAGNOSIS — Z6841 Body Mass Index (BMI) 40.0 and over, adult: Secondary | ICD-10-CM

## 2013-08-14 DIAGNOSIS — C189 Malignant neoplasm of colon, unspecified: Secondary | ICD-10-CM | POA: Diagnosis present

## 2013-08-14 DIAGNOSIS — I10 Essential (primary) hypertension: Secondary | ICD-10-CM | POA: Diagnosis present

## 2013-08-14 HISTORY — PX: LAPAROSCOPIC PARTIAL COLECTOMY: SHX5907

## 2013-08-14 LAB — TYPE AND SCREEN

## 2013-08-14 SURGERY — LAPAROSCOPIC PARTIAL COLECTOMY
Anesthesia: General | Site: Abdomen | Wound class: Contaminated

## 2013-08-14 MED ORDER — ACETAMINOPHEN 10 MG/ML IV SOLN
1000.0000 mg | Freq: Four times a day (QID) | INTRAVENOUS | Status: AC
  Administered 2013-08-14 – 2013-08-15 (×4): 1000 mg via INTRAVENOUS
  Filled 2013-08-14 (×4): qty 100

## 2013-08-14 MED ORDER — EPHEDRINE SULFATE 50 MG/ML IJ SOLN
INTRAMUSCULAR | Status: DC | PRN
  Administered 2013-08-14: 10 mg via INTRAVENOUS

## 2013-08-14 MED ORDER — MIDAZOLAM HCL 5 MG/5ML IJ SOLN
INTRAMUSCULAR | Status: DC | PRN
  Administered 2013-08-14: 2 mg via INTRAVENOUS

## 2013-08-14 MED ORDER — FENTANYL CITRATE 0.05 MG/ML IJ SOLN
INTRAMUSCULAR | Status: DC | PRN
  Administered 2013-08-14: 100 ug via INTRAVENOUS
  Administered 2013-08-14: 50 ug via INTRAVENOUS
  Administered 2013-08-14: 100 ug via INTRAVENOUS
  Administered 2013-08-14 (×3): 50 ug via INTRAVENOUS
  Administered 2013-08-14: 100 ug via INTRAVENOUS

## 2013-08-14 MED ORDER — MORPHINE SULFATE (PF) 1 MG/ML IV SOLN
INTRAVENOUS | Status: AC
  Filled 2013-08-14: qty 25

## 2013-08-14 MED ORDER — LACTATED RINGERS IR SOLN
Status: DC | PRN
  Administered 2013-08-14: 1

## 2013-08-14 MED ORDER — PROMETHAZINE HCL 25 MG/ML IJ SOLN: 6.2500 mg | INTRAMUSCULAR | Status: DC | PRN

## 2013-08-14 MED ORDER — LACTATED RINGERS IV SOLN
INTRAVENOUS | Status: DC | PRN
  Administered 2013-08-14: 08:00:00 via INTRAVENOUS

## 2013-08-14 MED ORDER — LABETALOL HCL 5 MG/ML IV SOLN
INTRAVENOUS | Status: DC | PRN
  Administered 2013-08-14: 5 mg via INTRAVENOUS
  Administered 2013-08-14: 15 mg via INTRAVENOUS
  Administered 2013-08-14: 2.5 mg via INTRAVENOUS
  Administered 2013-08-14: 7.5 mg via INTRAVENOUS

## 2013-08-14 MED ORDER — DEXAMETHASONE SODIUM PHOSPHATE 10 MG/ML IJ SOLN
INTRAMUSCULAR | Status: DC | PRN
  Administered 2013-08-14: 10 mg via INTRAVENOUS

## 2013-08-14 MED ORDER — ONDANSETRON HCL 4 MG/2ML IJ SOLN
INTRAMUSCULAR | Status: DC | PRN
  Administered 2013-08-14: 4 mg via INTRAVENOUS

## 2013-08-14 MED ORDER — CLINDAMYCIN PHOSPHATE 900 MG/50ML IV SOLN
INTRAVENOUS | Status: AC
  Filled 2013-08-14: qty 50

## 2013-08-14 MED ORDER — ALLOPURINOL 300 MG PO TABS
300.0000 mg | ORAL_TABLET | Freq: Every day | ORAL | Status: DC
  Administered 2013-08-14 – 2013-08-19 (×6): 300 mg via ORAL
  Filled 2013-08-14 (×6): qty 1

## 2013-08-14 MED ORDER — MORPHINE SULFATE (PF) 1 MG/ML IV SOLN
INTRAVENOUS | Status: DC
  Administered 2013-08-14: 17:00:00 via INTRAVENOUS
  Administered 2013-08-15: 7.5 mg via INTRAVENOUS
  Administered 2013-08-15: 10.5 mg via INTRAVENOUS
  Administered 2013-08-15: 6 mg via INTRAVENOUS
  Administered 2013-08-15: via INTRAVENOUS
  Administered 2013-08-15: 9.75 mg via INTRAVENOUS
  Administered 2013-08-15: 12.97 mg via INTRAVENOUS
  Administered 2013-08-16: 15 mg via INTRAVENOUS
  Administered 2013-08-16: 1.2 mg via INTRAVENOUS
  Administered 2013-08-16: 4.45 mg via INTRAVENOUS
  Administered 2013-08-16: 19:00:00 via INTRAVENOUS
  Administered 2013-08-16: 12 mg via INTRAVENOUS
  Administered 2013-08-16: 7.5 mg via INTRAVENOUS
  Administered 2013-08-16: 6 mg via INTRAVENOUS
  Administered 2013-08-16: 12 mg via INTRAVENOUS
  Administered 2013-08-17: 6 mg via INTRAVENOUS
  Administered 2013-08-17: 132.9 mg via INTRAVENOUS
  Administered 2013-08-17: 9 mg via INTRAVENOUS
  Administered 2013-08-17: 1.5 mL via INTRAVENOUS
  Filled 2013-08-14 (×5): qty 25

## 2013-08-14 MED ORDER — NEOSTIGMINE METHYLSULFATE 1 MG/ML IJ SOLN
INTRAMUSCULAR | Status: DC | PRN
  Administered 2013-08-14: 5 mg via INTRAVENOUS

## 2013-08-14 MED ORDER — HEPARIN SODIUM (PORCINE) 5000 UNIT/ML IJ SOLN
5000.0000 [IU] | Freq: Once | INTRAMUSCULAR | Status: AC
  Administered 2013-08-14: 5000 [IU] via SUBCUTANEOUS
  Filled 2013-08-14: qty 1

## 2013-08-14 MED ORDER — LACTATED RINGERS IV SOLN: INTRAVENOUS | Status: DC

## 2013-08-14 MED ORDER — HYDROMORPHONE HCL PF 1 MG/ML IJ SOLN: 0.2500 mg | INTRAMUSCULAR | Status: DC | PRN

## 2013-08-14 MED ORDER — CLINDAMYCIN PHOSPHATE 900 MG/50ML IV SOLN
900.0000 mg | Freq: Three times a day (TID) | INTRAVENOUS | Status: AC
  Administered 2013-08-14: 900 mg via INTRAVENOUS
  Filled 2013-08-14: qty 50

## 2013-08-14 MED ORDER — CARBAMAZEPINE 200 MG PO TABS
200.0000 mg | ORAL_TABLET | Freq: Two times a day (BID) | ORAL | Status: DC
  Administered 2013-08-14 – 2013-08-19 (×9): 200 mg via ORAL
  Filled 2013-08-14 (×12): qty 1

## 2013-08-14 MED ORDER — LACTATED RINGERS IV SOLN: Freq: Once | INTRAVENOUS | Status: DC

## 2013-08-14 MED ORDER — LIDOCAINE HCL (CARDIAC) 20 MG/ML IV SOLN
INTRAVENOUS | Status: DC | PRN
  Administered 2013-08-14: 100 mg via INTRAVENOUS

## 2013-08-14 MED ORDER — MEPERIDINE HCL 50 MG/ML IJ SOLN
INTRAMUSCULAR | Status: AC
  Filled 2013-08-14: qty 1

## 2013-08-14 MED ORDER — DIPHENHYDRAMINE HCL 12.5 MG/5ML PO ELIX: 12.5000 mg | ORAL_SOLUTION | Freq: Four times a day (QID) | ORAL | Status: DC | PRN

## 2013-08-14 MED ORDER — PHENYLEPHRINE HCL 10 MG/ML IJ SOLN
INTRAMUSCULAR | Status: DC | PRN
  Administered 2013-08-14 (×3): 80 ug via INTRAVENOUS

## 2013-08-14 MED ORDER — ENOXAPARIN SODIUM 40 MG/0.4ML ~~LOC~~ SOLN
40.0000 mg | SUBCUTANEOUS | Status: DC
  Administered 2013-08-15 – 2013-08-18 (×4): 40 mg via SUBCUTANEOUS
  Filled 2013-08-14 (×6): qty 0.4

## 2013-08-14 MED ORDER — PROPOFOL 10 MG/ML IV BOLUS
INTRAVENOUS | Status: DC | PRN
  Administered 2013-08-14: 200 mg via INTRAVENOUS

## 2013-08-14 MED ORDER — BUPIVACAINE-EPINEPHRINE 0.25% -1:200000 IJ SOLN
INTRAMUSCULAR | Status: DC | PRN
  Administered 2013-08-14: 15 mL

## 2013-08-14 MED ORDER — 0.9 % SODIUM CHLORIDE (POUR BTL) OPTIME
TOPICAL | Status: DC | PRN
  Administered 2013-08-14: 1000 mL

## 2013-08-14 MED ORDER — ESMOLOL HCL 10 MG/ML IV SOLN
INTRAVENOUS | Status: DC | PRN
  Administered 2013-08-14: 40 mg via INTRAVENOUS
  Administered 2013-08-14: 20 mg via INTRAVENOUS
  Administered 2013-08-14: 30 mg via INTRAVENOUS

## 2013-08-14 MED ORDER — HYDROMORPHONE HCL PF 1 MG/ML IJ SOLN
INTRAMUSCULAR | Status: DC | PRN
  Administered 2013-08-14 (×2): 0.5 mg via INTRAVENOUS
  Administered 2013-08-14: 1 mg via INTRAVENOUS
  Administered 2013-08-14: 0.5 mg via INTRAVENOUS
  Administered 2013-08-14: 1 mg via INTRAVENOUS
  Administered 2013-08-14: 0.5 mg via INTRAVENOUS

## 2013-08-14 MED ORDER — ALVIMOPAN 12 MG PO CAPS
12.0000 mg | ORAL_CAPSULE | Freq: Once | ORAL | Status: AC
  Administered 2013-08-14: 12 mg via ORAL
  Filled 2013-08-14: qty 1

## 2013-08-14 MED ORDER — METOCLOPRAMIDE HCL 5 MG/ML IJ SOLN
INTRAMUSCULAR | Status: DC | PRN
  Administered 2013-08-14: 10 mg via INTRAVENOUS

## 2013-08-14 MED ORDER — GLYCOPYRROLATE 0.2 MG/ML IJ SOLN
INTRAMUSCULAR | Status: DC | PRN
  Administered 2013-08-14: 0.6 mg via INTRAVENOUS

## 2013-08-14 MED ORDER — DIPHENHYDRAMINE HCL 50 MG/ML IJ SOLN: 12.5000 mg | Freq: Four times a day (QID) | INTRAMUSCULAR | Status: DC | PRN

## 2013-08-14 MED ORDER — BUPIVACAINE-EPINEPHRINE 0.25% -1:200000 IJ SOLN
INTRAMUSCULAR | Status: AC
  Filled 2013-08-14: qty 1

## 2013-08-14 MED ORDER — KETAMINE HCL 10 MG/ML IJ SOLN
INTRAMUSCULAR | Status: DC | PRN
  Administered 2013-08-14: 30 mg via INTRAVENOUS

## 2013-08-14 MED ORDER — LACTATED RINGERS IV SOLN
INTRAVENOUS | Status: DC
  Administered 2013-08-14 – 2013-08-16 (×5): via INTRAVENOUS

## 2013-08-14 MED ORDER — ROCURONIUM BROMIDE 100 MG/10ML IV SOLN
INTRAVENOUS | Status: DC | PRN
  Administered 2013-08-14 (×2): 20 mg via INTRAVENOUS
  Administered 2013-08-14: 30 mg via INTRAVENOUS
  Administered 2013-08-14 (×3): 20 mg via INTRAVENOUS
  Administered 2013-08-14: 60 mg via INTRAVENOUS
  Administered 2013-08-14 (×3): 20 mg via INTRAVENOUS

## 2013-08-14 MED ORDER — MEPERIDINE HCL 50 MG/ML IJ SOLN
6.2500 mg | INTRAMUSCULAR | Status: DC | PRN
  Administered 2013-08-14: 12.5 mg via INTRAVENOUS

## 2013-08-14 MED ORDER — SUCCINYLCHOLINE CHLORIDE 20 MG/ML IJ SOLN
INTRAMUSCULAR | Status: DC | PRN
  Administered 2013-08-14: 200 mg via INTRAVENOUS

## 2013-08-14 MED ORDER — NALOXONE HCL 0.4 MG/ML IJ SOLN: 0.4000 mg | INTRAMUSCULAR | Status: DC | PRN

## 2013-08-14 MED ORDER — ONDANSETRON HCL 4 MG/2ML IJ SOLN: 4.0000 mg | Freq: Four times a day (QID) | INTRAMUSCULAR | Status: DC | PRN

## 2013-08-14 MED ORDER — SODIUM CHLORIDE 0.9 % IJ SOLN: 9.0000 mL | INTRAMUSCULAR | Status: DC | PRN

## 2013-08-14 MED ORDER — ALVIMOPAN 12 MG PO CAPS
12.0000 mg | ORAL_CAPSULE | Freq: Two times a day (BID) | ORAL | Status: DC
  Administered 2013-08-15 – 2013-08-17 (×5): 12 mg via ORAL
  Filled 2013-08-14 (×6): qty 1

## 2013-08-14 SURGICAL SUPPLY — 86 items
APPLIER CLIP 5 13 M/L LIGAMAX5 (MISCELLANEOUS)
BLADE EXTENDED COATED 6.5IN (ELECTRODE) ×2 IMPLANT
BLADE HEX COATED 2.75 (ELECTRODE) ×4 IMPLANT
BLADE SURG SZ10 CARB STEEL (BLADE) ×2 IMPLANT
CABLE HIGH FREQUENCY MONO STRZ (ELECTRODE) ×2 IMPLANT
CANISTER SUCTION 2500CC (MISCELLANEOUS) ×2 IMPLANT
CELLS DAT CNTRL 66122 CELL SVR (MISCELLANEOUS) IMPLANT
CHLORAPREP W/TINT 26ML (MISCELLANEOUS) ×2 IMPLANT
CLIP APPLIE 5 13 M/L LIGAMAX5 (MISCELLANEOUS) IMPLANT
COUNTER NEEDLE 20 DBL MAG RED (NEEDLE) ×2 IMPLANT
COVER MAYO STAND STRL (DRAPES) ×4 IMPLANT
DECANTER SPIKE VIAL GLASS SM (MISCELLANEOUS) ×2 IMPLANT
DRAIN CHANNEL 19F RND (DRAIN) IMPLANT
DRAPE LAPAROSCOPIC ABDOMINAL (DRAPES) ×2 IMPLANT
DRAPE LG THREE QUARTER DISP (DRAPES) ×4 IMPLANT
DRAPE UTILITY XL STRL (DRAPES) ×4 IMPLANT
DRAPE WARM FLUID 44X44 (DRAPE) ×2 IMPLANT
DRSG EMULSION OIL 3X16 NADH (GAUZE/BANDAGES/DRESSINGS) ×2 IMPLANT
DRSG OPSITE POSTOP 4X10 (GAUZE/BANDAGES/DRESSINGS) ×2 IMPLANT
DRSG OPSITE POSTOP 4X6 (GAUZE/BANDAGES/DRESSINGS) IMPLANT
DRSG OPSITE POSTOP 4X8 (GAUZE/BANDAGES/DRESSINGS) IMPLANT
DRSG VAC ATS SM SENSATRAC (GAUZE/BANDAGES/DRESSINGS) ×2 IMPLANT
ELECT REM PT RETURN 9FT ADLT (ELECTROSURGICAL) ×2
ELECTRODE REM PT RTRN 9FT ADLT (ELECTROSURGICAL) ×1 IMPLANT
EVACUATOR SILICONE 100CC (DRAIN) IMPLANT
GLOVE BIO SURGEON STRL SZ 6.5 (GLOVE) ×4 IMPLANT
GLOVE BIOGEL PI IND STRL 7.0 (GLOVE) ×2 IMPLANT
GLOVE BIOGEL PI INDICATOR 7.0 (GLOVE) ×2
GLOVE SURG SS PI 7.5 STRL IVOR (GLOVE) ×2 IMPLANT
GOWN STRL REIN 2XL XLG LVL4 (GOWN DISPOSABLE) ×6 IMPLANT
GOWN STRL REIN XL XLG (GOWN DISPOSABLE) ×28 IMPLANT
KIT BASIN OR (CUSTOM PROCEDURE TRAY) ×2 IMPLANT
LEGGING LITHOTOMY PAIR STRL (DRAPES) ×2 IMPLANT
LIGASURE IMPACT 36 18CM CVD LR (INSTRUMENTS) IMPLANT
NS IRRIG 1000ML POUR BTL (IV SOLUTION) ×8 IMPLANT
PENCIL BUTTON HOLSTER BLD 10FT (ELECTRODE) ×4 IMPLANT
RTRCTR WOUND ALEXIS 18CM MED (MISCELLANEOUS)
SCISSORS LAP 5X35 DISP (ENDOMECHANICALS) ×2 IMPLANT
SEALER TISSUE G2 CVD JAW 35 (ENDOMECHANICALS) IMPLANT
SEALER TISSUE G2 CVD JAW 45CM (ENDOMECHANICALS)
SEALER TISSUE G2 STRG ARTC 35C (ENDOMECHANICALS) ×2 IMPLANT
SET IRRIG TUBING LAPAROSCOPIC (IRRIGATION / IRRIGATOR) ×2 IMPLANT
SLEEVE XCEL OPT CAN 5 100 (ENDOMECHANICALS) ×8 IMPLANT
SOLUTION ANTI FOG 6CC (MISCELLANEOUS) ×2 IMPLANT
SPONGE DRAIN TRACH 4X4 STRL 2S (GAUZE/BANDAGES/DRESSINGS) ×2 IMPLANT
SPONGE GAUZE 4X4 12PLY (GAUZE/BANDAGES/DRESSINGS) ×2 IMPLANT
SPONGE LAP 18X18 X RAY DECT (DISPOSABLE) ×6 IMPLANT
STAPLER CIRC ILS CVD 33MM 37CM (STAPLE) ×2 IMPLANT
STAPLER CUT CVD 40MM BLUE (STAPLE) ×2 IMPLANT
STAPLER CUT RELOAD BLUE (STAPLE) ×10 IMPLANT
STAPLER VISISTAT 35W (STAPLE) IMPLANT
SUCTION POOLE TIP (SUCTIONS) ×2 IMPLANT
SUT ETHILON 2 0 PS N (SUTURE) IMPLANT
SUT NOVA 1 T20/GS 25DT (SUTURE) ×4 IMPLANT
SUT PDS AB 1 CTX 36 (SUTURE) IMPLANT
SUT PDS AB 1 TP1 96 (SUTURE) IMPLANT
SUT PROLENE 2 0 KS (SUTURE) ×2 IMPLANT
SUT SILK 2 0 (SUTURE) ×1
SUT SILK 2 0 SH (SUTURE) ×2 IMPLANT
SUT SILK 2 0 SH CR/8 (SUTURE) ×4 IMPLANT
SUT SILK 2-0 18XBRD TIE 12 (SUTURE) ×1 IMPLANT
SUT SILK 3 0 (SUTURE) ×1
SUT SILK 3 0 SH 30 (SUTURE) ×6 IMPLANT
SUT SILK 3 0 SH CR/8 (SUTURE) ×2 IMPLANT
SUT SILK 3-0 18XBRD TIE 12 (SUTURE) ×1 IMPLANT
SUT VIC AB 2-0 SH 18 (SUTURE) IMPLANT
SUT VIC AB 2-0 UR5 27 (SUTURE) ×6 IMPLANT
SUT VIC AB 4-0 PS2 18 (SUTURE) IMPLANT
SUT VICRYL 2 0 18  UND BR (SUTURE) ×1
SUT VICRYL 2 0 18 UND BR (SUTURE) ×1 IMPLANT
SYR BULB IRRIGATION 50ML (SYRINGE) IMPLANT
SYS LAPSCP GELPORT 120MM (MISCELLANEOUS) ×2
SYSTEM LAPSCP GELPORT 120MM (MISCELLANEOUS) ×1 IMPLANT
TAPE CLOTH SURG 4X10 WHT LF (GAUZE/BANDAGES/DRESSINGS) ×2 IMPLANT
TOWEL OR 17X26 10 PK STRL BLUE (TOWEL DISPOSABLE) ×4 IMPLANT
TOWEL OR NON WOVEN STRL DISP B (DISPOSABLE) ×4 IMPLANT
TRAY FOLEY CATH 14FRSI W/METER (CATHETERS) ×2 IMPLANT
TRAY LAP CHOLE (CUSTOM PROCEDURE TRAY) ×2 IMPLANT
TROCAR BLADELESS OPT 5 100 (ENDOMECHANICALS) ×2 IMPLANT
TROCAR XCEL 12X100 BLDLESS (ENDOMECHANICALS) IMPLANT
TROCAR XCEL BLUNT TIP 100MML (ENDOMECHANICALS) ×2 IMPLANT
TROCAR XCEL NON-BLD 11X100MML (ENDOMECHANICALS) ×2 IMPLANT
TUBING CONNECTING 10 (TUBING) ×4 IMPLANT
TUBING FILTER THERMOFLATOR (ELECTROSURGICAL) ×2 IMPLANT
TUBING INSUFFLATION 10FT LAP (TUBING) ×2 IMPLANT
YANKAUER SUCT BULB TIP 10FT TU (MISCELLANEOUS) ×6 IMPLANT

## 2013-08-14 NOTE — H&P (View-Only) (Signed)
Chief Complaint  Patient presents with  . Routine Post Op    1st po colonoscopy    HISTORY: John Hubbard is a 52 y.o. male who presents to the office with rectal bleeding.  This was found colonoscopy performed for rectal bleeding.  Workup thus far has included CT chest, Abd and Pelvis.   He has a CEA pending.  He denies any cardiac issues.  He can walk up a flight of stairs with DOE.  He denies any chest pain.  He does have signs concerning for OSA.  Past Medical History  Diagnosis Date  . Hypertension       Past Surgical History  Procedure Laterality Date  . Hernia repair  06/19/13    Umbilical Hernia Repair  . Colonoscopy Bilateral 07/24/2013    Procedure: COLONOSCOPY;  Surgeon: Hilda Wexler, MD;  Location: [facility];  Service: Endoscopy;  Laterality: Bilateral;      Current Outpatient Prescriptions  Medication Sig Dispense Refill  . allopurinol (ZYLOPRIM) 300 MG tablet       . carbamazepine (TEGRETOL) 200 MG tablet Take 200 mg by mouth 2 (two) times daily.      . metroNIDAZOLE (FLAGYL) 500 MG tablet Take 1 tablet (500 mg total) by mouth as directed.  3 tablet  0   No current facility-administered medications for this visit.      Allergies  Allergen Reactions  . Penicillins Hives      History reviewed. No pertinent family history.    History   Social History  . Marital Status: Married    Spouse Name: N/A    Number of Children: N/A  . Years of Education: N/A   Social History Main Topics  . Smoking status: Never Smoker   . Smokeless tobacco: Never Used  . Alcohol Use: Yes     Comment: couple times weekly  . Drug Use: No  . Sexual Activity: None   Other Topics Concern  . None   Social History Narrative  . None       REVIEW OF SYSTEMS - PERTINENT POSITIVES ONLY: Review of Systems - General ROS: negative for - chills or fever Respiratory ROS: no cough, shortness of breath, or wheezing Cardiovascular ROS: no chest pain or dyspnea on  exertion Gastrointestinal ROS: no abdominal pain, change in bowel habits, or black or bloody stools Genito-Urinary ROS: no dysuria, trouble voiding, or hematuria  EXAM: Filed Vitals:   07/31/13 0955  BP: 160/98  Pulse: 88  Temp: 97.6 F (36.4 C)  Resp: 16      Gen:  No acute distress.  Well nourished and well groomed.   Neurological: Alert and oriented to person, place, and time. Coordination normal.  Head: Normocephalic and atraumatic.  Eyes: Conjunctivae are normal. Pupils are equal, round, and reactive to light. No scleral icterus.  Neck: Normal range of motion. Neck supple. No tracheal deviation or thyromegaly present.  No cervical lymphadenopathy. Cardiovascular: Normal rate, regular rhythm, normal heart sounds and intact distal pulses.   Respiratory: Effort normal.  No respiratory distress. No chest wall tenderness. Breath sounds normal.  No wheezes, rales or rhonchi.  GI: Soft. Bowel sounds are normal. The abdomen is soft and nontender.  There is no rebound and no guarding.  Musculoskeletal: Normal range of motion. Extremities are nontender.  Skin: Skin is warm and dry. No rash noted. No diaphoresis. No erythema. No pallor. No clubbing, cyanosis, or edema.   Psychiatric: Normal mood and affect. Behavior is normal. Judgment and thought   content normal.      LABORATORY RESULTS: Available labs are reviewed  CEA: pending  RADIOLOGY RESULTS:   Images and reports are reviewed. CT Chest Abd and pelvis shows no signs of metastatic disease.     ASSESSMENT AND PLAN: John Hubbard is a 52 y.o. 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  This was tattooed distally and proximately.  CT shows no signs of metastatic disease.  The surgery and anatomy were described to the patient as well as the risks of surgery and the possible complications.  These include: Bleeding, infection and possible wound complications such as hernia, damage to adjacent structures,  leak of surgical connections, which can lead to other surgeries and possibly an ostomy (5-7%), possible need for other procedures, such as abscess drains in radiology, possible prolonged hospital stay, possible diarrhea from removal of part of the colon, possible constipation from narcotics, prolonged fatigue/weakness or appetite loss, possible early recurrence of cancer, possible complications of their medical problems such as heart disease or arrhythmias or lung problems, death (less than 1%).  We also discussed that he is at slightly higher risk of hernia due to his weight.  I believe the patient understands and wishes to proceed with the surgery.      [name] C [name], MD Colon and Rectal Surgery / General Surgery [facility]      Visit Diagnoses: 1. Colon cancer     Primary Care Physician: [name],[name], MD    

## 2013-08-14 NOTE — Anesthesia Preprocedure Evaluation (Signed)
Anesthesia Evaluation  Patient identified by MRN, date of birth, ID band Patient awake    Reviewed: Allergy & Precautions, H&P , NPO status , Patient's Chart, lab work & pertinent test results  Airway Mallampati: II TM Distance: >3 FB Neck ROM: Full    Dental no notable dental hx.    Pulmonary neg pulmonary ROS,  breath sounds clear to auscultation  Pulmonary exam normal       Cardiovascular hypertension, negative cardio ROS  Rhythm:Regular Rate:Normal     Neuro/Psych negative neurological ROS  negative psych ROS   GI/Hepatic negative GI ROS, Neg liver ROS,   Endo/Other  negative endocrine ROSMorbid obesity  Renal/GU negative Renal ROS  negative genitourinary   Musculoskeletal negative musculoskeletal ROS (+)   Abdominal   Peds negative pediatric ROS (+)  Hematology negative hematology ROS (+)   Anesthesia Other Findings   Reproductive/Obstetrics negative OB ROS                           Anesthesia Physical Anesthesia Plan  ASA: III  Anesthesia Plan: General   Post-op Pain Management:    Induction: Intravenous  Airway Management Planned: Oral ETT  Additional Equipment:   Intra-op Plan:   Post-operative Plan: Extubation in OR  Informed Consent: I have reviewed the patients History and Physical, chart, labs and discussed the procedure including the risks, benefits and alternatives for the proposed anesthesia with the patient or authorized representative who has indicated his/her understanding and acceptance.   Dental advisory given  Plan Discussed with: CRNA  Anesthesia Plan Comments:         Anesthesia Quick Evaluation

## 2013-08-14 NOTE — Transfer of Care (Signed)
Immediate Anesthesia Transfer of Care Note  Patient: John Hubbard  Procedure(s) Performed: Procedure(s): LAPAROSCOPIC PARTIAL COLECTOMY wound vac placement (N/A)  Patient Location: PACU  Anesthesia Type:General  Level of Consciousness: awake and alert   Airway & Oxygen Therapy: Patient Spontanous Breathing and Patient connected to face mask oxygen  Post-op Assessment: Report given to PACU RN and Post -op Vital signs reviewed and stable  Post vital signs: Reviewed and stable  Complications: No apparent anesthesia complications

## 2013-08-14 NOTE — Interval H&P Note (Signed)
History and Physical Interval Note:  08/14/2013 8:27 AM  John Hubbard  has presented today for surgery, with the diagnosis of colon cancer  The various methods of treatment have been discussed with the patient and family. After consideration of risks, benefits and other options for treatment, the patient has consented to  Procedure(s): LAPAROSCOPIC PARTIAL COLECTOMY (N/A) as a surgical intervention .  The patient's history has been reviewed, patient examined, no change in status, stable for surgery.  I have reviewed the patient's chart and labs.  Questions were answered to the patient's satisfaction.     Vanita Panda, MD  Colorectal and General Surgery [facility]

## 2013-08-14 NOTE — Op Note (Signed)
08/14/2013  4:11 PM  PATIENT:  John Hubbard  52 y.o. male  Patient Care Team: Aida Puffer, MD as PCP - General (Family Medicine)  PRE-OPERATIVE DIAGNOSIS:  colon cancer  POST-OPERATIVE DIAGNOSIS:  colon cancer  PROCEDURE:  Low anterior resection wound vac placement  SURGEON:  Surgeon(s): Romie Levee, MD Axel Filler, MD Ardeth Sportsman, MD  ASSISTANT: Derrell Lolling   ANESTHESIA:   general  EBL:  Total I/O In: 3000 [I.V.:3000] Out: 600 [Urine:200; Blood:400]  DRAINS: (62F) Jackson-Pratt drain(s) with closed bulb suction in the pelvis   SPECIMEN:  Source of Specimen:  Recto-sigmoid Specimen with tumor is distal sigmoid and part with no tumor is proximal sigmoid  DISPOSITION OF SPECIMEN:  PATHOLOGY  COUNTS:  YES  PLAN OF CARE: Admit to inpatient   PATIENT DISPOSITION:  PACU - hemodynamically stable.  INDICATION: this is a 52 year old male who presented to my office with rectal bleeding. Colonoscopy was performed. It showed a distal sigmoid cancer. Preoperative workup shows no signs of metastatic disease. He is here today for resection.   OR FINDINGS: difficult surgery due to significant intra-abdominal fat. Cancer noted in the distal sigmoid. polyp removed in the proximal rectum as well  DESCRIPTION: the patient was identified in the preoperative holding area and taken to the OR where he was laid supine on the operating room table. General anesthesia was smoothly induced. A Foley catheter was inserted under sterile conditions. The patient's abdomen and pelvis was prepped and draped in usual sterile fashion. A surgical timeout was performed and again the correct patient, positioning and preoperative antibiotics. SCDs were noted to be in place prior to the initiation of anesthesia. I began by gaining entry into the abdomen laparoscopically using a 5 mm Optiview port. An incision was made with a scalpel. I entered the abdomen without difficulty. The abdomen was insufflated  to approximately 15 mm mercury. A supra umbilical port was placed as well as a right upper right lower quadrant port -all under direct visualization.  There was a significant portion of omentum that was adherent to the previously placed mesh.  This was easily taken down using blunt dissection and the Enseal device. I then terminated into the patient's pelvis. The patient was placed right side down and in Trendelenburg. The small bowel was mobilized out of the pelvis. The sigmoid artery and vein were dissected free from surrounding structures. The left ureter was identified and preserved after dissection of the lateral sidewall. Once all crucial structures identified the sigmoid artery and vein were transected approximately 2-3 cm distal to the takeoff of from the aorta using the Enseal device. After this was completed I continued to mobilize distally into the presacral space as the tattoo mark from the distal edge of the resection margin was at the peritoneal reflection. I then transected the peritoneum on both sidewalls and connected this to the transection of the peritoneal reflection. Dissection was carried underneath the peritoneal reflection for 2-3 cm. The mesorectum was transected using the Enseal device. Given the significant amount of intra-abdominal fat it was very difficult to see much pass this.  I decided to create a Pfannenstiel incision. An incision was made using a scalpel. Dissection was carried down to the level of the fascia using Bovie electrocautery. The fascia was then incised in transverse fashion. The fascia was then elevated using 2 Kocher clamps. I then carefully cut took down the muscle attachments to the fascia to the level of the mesh superiorly in the level of  the pubic symphysis inferiorly. After this the peritoneum was entered bluntly and opened vertically. I initially placed a GelPort retractor in to the patient's abdomen. The sigmoid colon was brought up out of the wound. Given the  significant amount of mesocolic fat I wasn't able to mobilize both loops of the colon, so I transected the colon just proximal to the tumor using the contour stapler device.  I then divided the mesentery using the Enseal device. I then placed the gelcap back on the wound protractor and reinsufflated the abdomen. The remaining portion of the mesentery was divided using the Enseal device laparoscopically. The sigmoid artery and vein were identified and skeletonized. Is a device was then used to transect both of these without difficulty. After this was completed the colon was significantly mobilized dissecting up the white line of Toldt to the level of the splenic flexure. I desufflated the abdomen and brought out the colon from the GelPort wound.   I was able to dissect down into the presacral space and developed a window behind the rectum using blunt dissection. A contour stapler device was placed distal to the most distal tattoo mark. The contour stapler device was fired. The device did not fire completely and the rectum was left open. I placed a second contour device into the pelvis and fired this across the rectum as well. It appeared that this did track directly and after the remaining mesenteric attachments were dissected the specimen was brought out of the abdomen. It will be sent to pathology for further evaluation. I then was able to deliver the rest of the sigmoid colon. The remaining mesentery proximal to the transected sigmoid artery and vein was transected using insulin device. The sigmoid colon was then transected between 2 clamps. The distal sigmoid was sent to pathology as the remainder of the specimen. I then opened the proximal sigmoid. There was good bleeding noted at the mucosa. I sized the colon to a 33 mm EEA stapler. This was brought onto the field. Depression or device was placed across the colon and a 2-0 Prolene suture was placed through the pursestring device. This was then secured using  3-0 silk sutures the pursestring was then tightened and tied around the anvil and placed back into the abdomen.  After this was completed I reevaluated the rectal stump. I had my partner insufflated the rectal stump and I did note a leak. This appeared to be posterior. I identified both corners of the rectal stump and placed a running 2-0 silk suture across the rectal mucosa. This closed the rectal mucosa and I brought this contour stapler across this. This appeared to close down the rectal stump once again. We then insufflated the rectum and there was still a hole posteriorly. At this point I enlarged my Pfannenstiel incision and placed a Bookwalter retraction device onto the field.  Once we were able to appropriately retracted and the pelvis a posterior defect was noted. We dissected down to approximately mid rectum around the mesorectum plane. I then carefully thinned out beneath the rectal fat in an area of the rectum that was distal to the leak. Once all the mesorectal fat was cleared 2 Allises were placed onto the stump and one Allis was placed on the posterior tear. A contour stapling device was placed into the pelvis and this was brought through the stapler. The device was then fired. We then checked the rectum once again for patency. There was a leak noted once again in the distal  portion of the staple line on the right side. It appears that the staples did not fire completely at this point. It was easily identified and closed using an Allis clamp. 2 3-0 silk sutures were placed to close this as well. Once the rectum was insufflated again there were no leaks noted under insufflation. The EEA stapler was then brought up through the rectal stump and I brought out the interval just medial to the staple line disruption. This allowed it to be incorporated in the anastomosis. The anastomosis was completed without difficulty. There were 2 normal-appearing doughnuts with this. Anastomosis was then insufflated  under water and no air leaks were noted. A 19 [place] Blake drain was placed around the anastomosis and brought out through the right lower quadrant port site. The abdomen was irrigated with several liters of warm saline. A JP drain was secured with a 2-0 nylon suture.  We then switched to the drapes and clean instruments. At this point the omentum was brought down into the pelvis.  The peritoneum was closed with a 2-0 running Vicryl suture. The muscle layer was brought together at the midline using interrupted 2-0 Vicryl sutures. The fascia was then closed using #1 looped PDS sutures x2. The skin was then loosely approximated using staples. All the ports were removed and staples were used to close the port sites as well. An incisional wound VAC was placed. A layer of Adaptic was placed over the closed staple line. A small wound VAC was then used to cover the anastomosis with a black sponge. This was then secured into place with the wound VAC covering and hooked to suction without any signs of leak. Sterile dressings were placed around the other incisions. The patient was then awakened from anesthesia and sent to the postanesthesia care unit in stable condition. All counts were correct per operating room staff. I discussed his case with the family and his wife in great detail. I disclosed the stapler difficulties and what we did to fix the problem. They expressed understanding and appreciation.

## 2013-08-14 NOTE — Preoperative (Signed)
Beta Blockers   Reason not to administer Beta Blockers:Not Applicable 

## 2013-08-15 ENCOUNTER — Encounter (HOSPITAL_COMMUNITY): Payer: Self-pay | Admitting: General Surgery

## 2013-08-15 LAB — CBC
HCT: 36.5 % — ABNORMAL LOW (ref 39.0–52.0)
Hemoglobin: 12.6 g/dL — ABNORMAL LOW (ref 13.0–17.0)
MCH: 29 pg (ref 26.0–34.0)
MCHC: 34.5 g/dL (ref 30.0–36.0)
MCV: 83.9 fL (ref 78.0–100.0)
Platelets: 205 10*3/uL (ref 150–400)
RDW: 13.1 % (ref 11.5–15.5)
WBC: 12.4 10*3/uL — ABNORMAL HIGH (ref 4.0–10.5)

## 2013-08-15 LAB — BASIC METABOLIC PANEL
BUN: 21 mg/dL (ref 6–23)
CO2: 25 mEq/L (ref 19–32)
Calcium: 8.6 mg/dL (ref 8.4–10.5)
Chloride: 100 mEq/L (ref 96–112)
Creatinine, Ser: 1.06 mg/dL (ref 0.50–1.35)
GFR calc Af Amer: >90 mL/min (ref >90)
Glucose, Bld: 155 mg/dL — ABNORMAL HIGH (ref 70–99)
Potassium: 4.4 mEq/L (ref 3.5–5.1)

## 2013-08-15 NOTE — Progress Notes (Signed)
Pt reports numbness in his right hand is getting better. Will continue to monitor.

## 2013-08-15 NOTE — Progress Notes (Signed)
Pt reports numbness in right hand. States "feels like its asleep". Will continue to monitor.

## 2013-08-15 NOTE — Progress Notes (Addendum)
1 Day Post-Op Lap assisted LAR Subjective: Doing well, pain controlled, UOP adequate but dark, vitals stable after fever in PACU resolved  Objective: Vital signs in last 24 hours: Temp:  [97.7 F (36.5 C)-102.2 F (39 C)] 98.2 F (36.8 C) (11/13 0524) Pulse Rate:  [93-111] 110 (11/13 0524) Resp:  [13-24] 22 (11/13 0532) BP: (97-139)/(61-75) 112/67 mmHg (11/13 0524) SpO2:  [92 %-99 %] 96 % (11/13 0532) Weight:  [299 lb 13.2 oz (136 kg)] 299 lb 13.2 oz (136 kg) (11/12 0800)   Intake/Output from previous day: 11/12 0701 - 11/13 0700 In: 6916.7 [I.V.:6816.7; IV Piggyback:100] Out: 2005 [Urine:1350; Drains:255; Blood:400] Intake/Output this shift:     General appearance: alert and cooperative GI: normal findings: soft, appropriately tender Extremities: slight numbness in right hand JP: thin, sanguinous  Incision: no significant drainage, inc vac in place  Lab Results:   Recent Labs  08/15/13 0411  WBC 12.4*  HGB 12.6*  HCT 36.5*  PLT 205   BMET  Recent Labs  08/15/13 0411  NA 137  K 4.4  CL 100  CO2 25  GLUCOSE 155*  BUN 21  CREATININE 1.06  CALCIUM 8.6   PT/INR No results found for this basename: LABPROT, INR,  in the last 72 hours ABG No results found for this basename: PHART, PCO2, PO2, HCO3,  in the last 72 hours  MEDS, Scheduled . acetaminophen  1,000 mg Intravenous Q6H  . allopurinol  300 mg Oral Daily  . alvimopan  12 mg Oral BID  . carbamazepine  200 mg Oral BID  . enoxaparin (LOVENOX) injection  40 mg Subcutaneous Q24H  . morphine   Intravenous Q4H    Studies/Results: No results found.  Assessment: s/p Procedure(s): LAPAROSCOPIC PARTIAL COLECTOMY wound vac placement Patient Active Problem List   Diagnosis Date Noted  . Colon cancer 07/31/2013    Expected post op course  Plan: Advance diet to clears Cont IVF's as pt still appears slightly dehydrated   LOS: 1 day     .Vanita Panda, MD [facility],  [place] [phone]   08/15/2013 7:19 AM

## 2013-08-15 NOTE — Anesthesia Postprocedure Evaluation (Signed)
  Anesthesia Post-op Note  Patient: John Hubbard  Procedure(s) Performed: Procedure(s) (LRB): LAPAROSCOPIC PARTIAL COLECTOMY wound vac placement (N/A)  Patient Location: PACU  Anesthesia Type: General  Level of Consciousness: awake and alert   Airway and Oxygen Therapy: Patient Spontanous Breathing  Post-op Pain: mild  Post-op Assessment: Post-op Vital signs reviewed, Patient's Cardiovascular Status Stable, Respiratory Function Stable, Patent Airway and No signs of Nausea or vomiting  Last Vitals:  Filed Vitals:   08/15/13 0532  BP:   Pulse:   Temp:   Resp: 22    Post-op Vital Signs: stable   Complications: No apparent anesthesia complications

## 2013-08-15 NOTE — Evaluation (Signed)
Physical Therapy Evaluation Patient Details Name: John Hubbard MRN: [id number] DOB: [birth year] Today's Date: 08/15/2013 Time: 1191-4782 PT Time Calculation (min): 32 min  PT Assessment / Plan / Recommendation History of Present Illness  s/p Low anterior colon resection due to CA with VAC placement post op  Clinical Impression  Pt will benefit from PT to address deficits below. Plan is to go home with HHPT and wife assist; Pt still waiting on lymph node pathology/prognosis/potential follow up tx.    PT Assessment  Patient needs continued PT services    Follow Up Recommendations  Home health PT    Does the patient have the potential to tolerate intense rehabilitation      Barriers to Discharge        Equipment Recommendations  Rolling walker with 5" wheels (vs no DME/TBA)    Recommendations for Other Services     Frequency Min 3X/week    Precautions / Restrictions Precautions Precautions: Fall Precaution Comments: VAC/abdominal incision,JP drain, multiple lines   Pertinent Vitals/Pain VSS during and after mobility      Mobility  Bed Mobility Bed Mobility: Left Sidelying to Sit;Sit to Sidelying Left;Rolling Left Rolling Left: 4: Min assist Left Sidelying to Sit: 4: Min assist;With rails;HOB elevated (45*) Details for Bed Mobility Assistance: pt instructed in log rolling to decrease abdominal stress/pain during transitions Transfers Transfers: Sit to Stand;Stand to Sit Sit to Stand: 4: Min assist;From bed Stand to Sit: 4: Min assist;To chair/3-in-1 Details for Transfer Assistance: cues for  wt shift and breathing Ambulation/Gait Ambulation/Gait Assistance: 1: +2 Total assist Ambulation/Gait: Patient Percentage: 80% Ambulation Distance (Feet): 95 Feet Assistive device: 2 person hand held assist Ambulation/Gait Assistance Details: cues for posture and breathing Gait Pattern: Step-to pattern;Step-through pattern    Exercises     PT Diagnosis: Difficulty walking   PT Problem List: Decreased activity tolerance;Decreased balance;Decreased mobility;Decreased range of motion;Obesity PT Treatment Interventions: Gait training;DME instruction;Functional mobility training;Therapeutic activities;Therapeutic exercise;Patient/family education;Stair training     PT Goals(Current goals can be found in the care plan section) Acute Rehab PT Goals Patient Stated Goal: to walk, go home PT Goal Formulation: With patient Time For Goal Achievement: 08/29/13 Potential to Achieve Goals: Good  Visit Information  Last PT Received On: 08/15/13 Assistance Needed: +2 History of Present Illness: s/p Low anterior colon resection due to CA with VAC placement post op       Prior Functioning  Home Living Family/patient expects to be discharged to:: Private residence Living Arrangements: Spouse/significant other;Other relatives Available Help at Discharge: Family Type of Home: House Home Access: Stairs to enter [employer] of Steps: 6 Entrance Stairs-Rails: Right;Left Home Layout: Two level;Able to live on main level with bedroom/bathroom Additional Comments: wife will be able to assist, has taken off work through the 21st, can be home longer  if needed; has lift chair that he may sleep in; can stay downstairs but "his"  bedroom is upstairs Prior Function Level of Independence: Independent Communication Communication: No difficulties    Cognition  Cognition Arousal/Alertness: Awake/alert Behavior During Therapy: WFL for tasks assessed/performed Overall Cognitive Status: Within Functional Limits for tasks assessed    Extremity/Trunk Assessment Upper Extremity Assessment Upper Extremity Assessment: Overall WFL for tasks assessed Lower Extremity Assessment Lower Extremity Assessment: Overall WFL for tasks assessed (fatigues quickly)   Balance    End of Session PT - End of Session Activity Tolerance: Patient tolerated treatment well Patient left: in  chair;with call bell/phone within reach;with family/visitor present  GP  [facility] 08/15/2013, 3:36 PM

## 2013-08-15 NOTE — Care Management Note (Signed)
    Page 1 of 1   08/15/2013     11:03:47 AM   CARE MANAGEMENT NOTE 08/15/2013  Patient:  John Hubbard, John Hubbard   Account Number:  [id number]  Date Initiated:  08/15/2013  Documentation initiated by:  Lorenda Ishihara  Subjective/Objective Assessment:   52 yo male admitted s/p Lap LAR. PTA lived at home with spouse.     Action/Plan:   Home when stable   Anticipated DC Date:  08/19/2013   Anticipated DC Plan:  HOME/SELF CARE      DC Planning Services  CM consult      Choice offered to / List presented to:             Status of service:  Completed, signed off Medicare Important Message given?   (If response is "NO", the following Medicare IM given date fields will be blank) Date Medicare IM given:   Date Additional Medicare IM given:    Discharge Disposition:  HOME/SELF CARE  Per UR Regulation:  Reviewed for med. necessity/level of care/duration of stay  If discussed at Long Length of Stay Meetings, dates discussed:    Comments:

## 2013-08-16 LAB — CBC
HCT: 33.4 % — ABNORMAL LOW (ref 39.0–52.0)
Hemoglobin: 11.1 g/dL — ABNORMAL LOW (ref 13.0–17.0)
MCHC: 33.2 g/dL (ref 30.0–36.0)
MCV: 85.9 fL (ref 78.0–100.0)
Platelets: 168 10*3/uL (ref 150–400)
RBC: 3.89 MIL/uL — ABNORMAL LOW (ref 4.22–5.81)
RDW: 13.2 % (ref 11.5–15.5)
WBC: 11.2 10*3/uL — ABNORMAL HIGH (ref 4.0–10.5)

## 2013-08-16 LAB — BASIC METABOLIC PANEL
BUN: 13 mg/dL (ref 6–23)
CO2: 34 mEq/L — ABNORMAL HIGH (ref 19–32)
Calcium: 8.9 mg/dL (ref 8.4–10.5)
Creatinine, Ser: 0.73 mg/dL (ref 0.50–1.35)
GFR calc Af Amer: >90 mL/min (ref >90)
GFR calc non Af Amer: >90 mL/min (ref >90)
Potassium: 4 mEq/L (ref 3.5–5.1)

## 2013-08-16 MED ORDER — KCL IN DEXTROSE-NACL 20-5-0.45 MEQ/L-%-% IV SOLN
INTRAVENOUS | Status: DC
  Administered 2013-08-16: 10:00:00 via INTRAVENOUS
  Filled 2013-08-16: qty 1000

## 2013-08-16 NOTE — Progress Notes (Signed)
Dicussed path results with Pt and his family.  Pt reports some flatus today.  Will advance to full liquids and await BM.

## 2013-08-16 NOTE — Progress Notes (Signed)
Suspected brachial plexus injury  S/p laparoscpic colon resection. Severe prolonged trendelenburg position with arms tucked bilat. Morbid obesity. Post op pt noted numbness Left hand. Improved and almost resolved today. Explained to patient extensively.  A/P: Suspected brachial plexus injury, transient.  Almost totally resolved on POD #2. Would expect full resolution within 1 week and no long term problems. Please call anes with any further issues

## 2013-08-16 NOTE — Progress Notes (Signed)
Physical Therapy Treatment Patient Details Name: John Hubbard MRN: [id number] DOB: 1961/02/19 Today's Date: 08/16/2013 Time: 4098-1191 PT Time Calculation (min): 36 min  PT Assessment / Plan / Recommendation  History of Present Illness s/p Low anterior colon resection due to CA with VAC placement post op   PT Comments   Pt feeling better.  Assisted OOB to static stand to urinate then assisted with amb in hallway.  No AD.  One assist hand held assist.  Pt tolerated amb full unit around with increased time and x 3 standing rest breaks.  Pt c/o 5/10 ABD pain.    Follow Up Recommendations  Home health PT     Does the patient have the potential to tolerate intense rehabilitation     Barriers to Discharge        Equipment Recommendations  Rolling walker with 5" wheels    Recommendations for Other Services    Frequency Min 3X/week   Progress towards PT Goals Progress towards PT goals: Progressing toward goals  Plan      Precautions / Restrictions Precautions Precautions: None Precaution Comments: VAC/abdominal incision,JP drain, multiple lines Restrictions Weight Bearing Restrictions: No    Pertinent Vitals/Pain C/o 5/10 ABD pain    Mobility  Bed Mobility Bed Mobility: Supine to Sit;Sitting - Scoot to Edge of Bed Supine to Sit: 5: Supervision Sitting - Scoot to Edge of Bed: 5: Supervision Details for Bed Mobility Assistance: increased time due to multiple lines/tubes/drain Transfers Transfers: Sit to Stand;Stand to Sit Sit to Stand: 5: Supervision Stand to Sit: 5: Supervision Details for Transfer Assistance: cue for safety due to multple lines/tubes/drain Ambulation/Gait Ambulation/Gait Assistance: 5: Supervision;4: Min guard Ambulation Distance (Feet): 500 Feet (apround full unit) Assistive device: 1 person hand held assist Ambulation/Gait Assistance Details: Occassional hold to hallway rails.   Gait Pattern: Step-to pattern;Step-through pattern;Wide base of  support Gait velocity: decreased    PT Goals (current goals can now be found in the care plan section)    Visit Information  Last PT Received On: 08/16/13 Assistance Needed: +1 History of Present Illness: s/p Low anterior colon resection due to CA with VAC placement post op    Subjective Data      Cognition       Balance     End of Session PT - End of Session Equipment Utilized During Treatment: Gait belt Activity Tolerance: Patient tolerated treatment well Patient left: in chair;with call bell/phone within reach;with family/visitor present   Felecia Shelling  PTA [facility]  Acute  Rehab Pager      [phone]

## 2013-08-16 NOTE — Progress Notes (Signed)
2 Days Post-Op Lap assisted LAR Subjective: Doing well, pain controlled, UOP much better, vitals stable, no nausea  Objective: Vital signs in last 24 hours: Temp:  [97.9 F (36.6 C)-98.7 F (37.1 C)] 98 F (36.7 C) (11/14 0554) Pulse Rate:  [89-104] 89 (11/14 0554) Resp:  [14-21] 16 (11/14 0554) BP: (135-159)/(75-86) 140/81 mmHg (11/14 0554) SpO2:  [17 %-99 %] 94 % (11/14 0554)   Intake/Output from previous day: 11/13 0701 - 11/14 0700 In: 3372 [P.O.:360; I.V.:3012] Out: 3033 [Urine:2750; Drains:283] Intake/Output this shift:   General appearance: alert and cooperative GI: normal findings: soft, appropriately tender Extremities: slight numbness in right hand JP: thin, sanguinous  Incision: no significant drainage, incisional vac in place  Lab Results:   Recent Labs  08/15/13 0411 08/16/13 0430  WBC 12.4* 11.2*  HGB 12.6* 11.1*  HCT 36.5* 33.4*  PLT 205 168   BMET  Recent Labs  08/15/13 0411 08/16/13 0430  NA 137 138  K 4.4 4.0  CL 100 98  CO2 25 34*  GLUCOSE 155* 125*  BUN 21 13  CREATININE 1.06 0.73  CALCIUM 8.6 8.9   PT/INR No results found for this basename: LABPROT, INR,  in the last 72 hours ABG No results found for this basename: PHART, PCO2, PO2, HCO3,  in the last 72 hours  MEDS, Scheduled . allopurinol  300 mg Oral Daily  . alvimopan  12 mg Oral BID  . carbamazepine  200 mg Oral BID  . enoxaparin (LOVENOX) injection  40 mg Subcutaneous Q24H  . morphine   Intravenous Q4H    Studies/Results: No results found.  Assessment: s/p Procedure(s): LAPAROSCOPIC PARTIAL COLECTOMY wound vac placement Patient Active Problem List   Diagnosis Date Noted  . Colon cancer 07/31/2013    Expected post op course  Plan: Cont clears KVO IVF's D/C foley Ambulate Continue incisional wound vac until Mon   LOS: 2 days     .Vanita Panda, MD [facility], [place] [phone]   08/16/2013 7:31 AM

## 2013-08-17 LAB — BASIC METABOLIC PANEL
CO2: 33 mEq/L — ABNORMAL HIGH (ref 19–32)
Calcium: 9.2 mg/dL (ref 8.4–10.5)
Chloride: 96 mEq/L (ref 96–112)
Creatinine, Ser: 0.8 mg/dL (ref 0.50–1.35)
Glucose, Bld: 124 mg/dL — ABNORMAL HIGH (ref 70–99)
Potassium: 3.7 mEq/L (ref 3.5–5.1)
Sodium: 135 mEq/L (ref 135–145)

## 2013-08-17 LAB — CBC
MCH: 29.2 pg (ref 26.0–34.0)
MCV: 84.2 fL (ref 78.0–100.0)
Platelets: 181 10*3/uL (ref 150–400)
RBC: 3.8 MIL/uL — ABNORMAL LOW (ref 4.22–5.81)
RDW: 12.9 % (ref 11.5–15.5)
WBC: 10.9 10*3/uL — ABNORMAL HIGH (ref 4.0–10.5)

## 2013-08-17 MED ORDER — OXYCODONE HCL 5 MG PO TABS
5.0000 mg | ORAL_TABLET | ORAL | Status: DC | PRN
  Administered 2013-08-17 – 2013-08-19 (×9): 10 mg via ORAL
  Filled 2013-08-17 (×10): qty 2

## 2013-08-17 MED ORDER — MORPHINE SULFATE 2 MG/ML IJ SOLN
2.0000 mg | INTRAMUSCULAR | Status: DC | PRN
  Administered 2013-08-17 – 2013-08-18 (×3): 2 mg via INTRAVENOUS
  Filled 2013-08-17 (×3): qty 1

## 2013-08-17 NOTE — Progress Notes (Signed)
3 Days Post-Op  Subjective: Small bowel movement this morning, pain improving  Objective: Vital signs in last 24 hours: Temp:  [97.8 F (36.6 C)-98.7 F (37.1 C)] 98.2 F (36.8 C) (11/15 1000) Pulse Rate:  [82-99] 95 (11/15 1000) Resp:  [18-20] 20 (11/15 1000) BP: (136-181)/(78-90) 145/88 mmHg (11/15 1000) SpO2:  [97 %-100 %] 97 % (11/15 1000) Last BM Date: 08/17/13  Intake/Output from previous day: 11/14 0701 - 11/15 0700 In: 1203 [P.O.:840; I.V.:363] Out: 4260 [Urine:4000; Drains:260] Intake/Output this shift: Total I/O In: 360 [P.O.:240; I.V.:120] Out: 461 [Urine:400; Drains:60; Stool:1]  General appearance: alert, cooperative and no distress Resp: nonlabored Cardio: normal rate, regular GI: soft, appropriate lower abdominal tenderness, ND, wounds okay, incisional wound vac in place, JP ss  Lab Results:   Recent Labs  08/16/13 0430 08/17/13 0350  WBC 11.2* 10.9*  HGB 11.1* 11.1*  HCT 33.4* 32.0*  PLT 168 181   BMET  Recent Labs  08/16/13 0430 08/17/13 0350  NA 138 135  K 4.0 3.7  CL 98 96  CO2 34* 33*  GLUCOSE 125* 124*  BUN 13 9  CREATININE 0.73 0.80  CALCIUM 8.9 9.2   PT/INR No results found for this basename: LABPROT, INR,  in the last 72 hours ABG No results found for this basename: PHART, PCO2, PO2, HCO3,  in the last 72 hours  Studies/Results: No results found.  Anti-infectives: Anti-infectives   Start     Dose/Rate Route Frequency Ordered Stop   08/14/13 2230  clindamycin (CLEOCIN) IVPB 900 mg     900 mg 100 mL/hr over 30 Minutes Intravenous 3 times per day 08/14/13 1941 08/14/13 2135   08/14/13 0600  clindamycin (CLEOCIN) IVPB 900 mg     900 mg 100 mL/hr over 30 Minutes Intravenous On call to O.R. 08/13/13 1705 08/14/13 1430   08/14/13 0600  gentamicin (GARAMYCIN) 490 mg in dextrose 5 % 100 mL IVPB     490 mg 112.3 mL/hr over 60 Minutes Intravenous On call to O.R. 08/13/13 1705 08/14/13 0917      Assessment/Plan: s/p  Procedure(s): LAPAROSCOPIC PARTIAL COLECTOMY wound vac placement (N/A) seems to be doing okay.  tolerating full liquids.  will transition to oral pain meds and plan to advance to regular diet tomorrow if still doing well  LOS: 3 days    Lodema Pilot DAVID 08/17/2013

## 2013-08-17 NOTE — Progress Notes (Signed)
Pharmacy Brief Note - Alvimopan (Entereg)  The standing order set for alvimopan (Entereg) now includes an automatic order to discontinue the drug after the patient has had a bowel movement.  The change was approved by the Pharmacy & Therapeutics Committee and the Medical Executive Committee.    This patient has had a bowel movement documented by nursing on 08/17/13.  Therefore, alvimopan has been discontinued.  If there are questions, please contact the pharmacy at [phone].  Thank you  Lynann Beaver PharmD, BCPS Pager [phone] 08/17/2013 1:50 PM

## 2013-08-18 NOTE — Progress Notes (Signed)
4 Days Post-Op  Subjective: Still tolerating full liquids.  Asking for regular diet.  No additional bowel movements but still plenty of fluids.  Objective: Vital signs in last 24 hours: Temp:  [97.8 F (36.6 C)-98.9 F (37.2 C)] 97.8 F (36.6 C) (11/16 0445) Pulse Rate:  [94-103] 94 (11/16 0445) Resp:  [18-20] 18 (11/16 0445) BP: (143-166)/(79-88) 145/83 mmHg (11/16 0445) SpO2:  [95 %-97 %] 95 % (11/16 0445) Last BM Date: 08/17/13  Intake/Output from previous day: 11/15 0701 - 11/16 0700 In: 1000 [P.O.:720; I.V.:280] Out: 2261 [Urine:2125; Drains:135; Stool:1] Intake/Output this shift:    General appearance: alert, cooperative and no distress Resp: nonlabored Cardio: normal rate, regular GI: abdomen looks good, incisions okay, ND, incisional wound vac in place, no peritoneal signs  Lab Results:   Recent Labs  08/16/13 0430 08/17/13 0350  WBC 11.2* 10.9*  HGB 11.1* 11.1*  HCT 33.4* 32.0*  PLT 168 181   BMET  Recent Labs  08/16/13 0430 08/17/13 0350  NA 138 135  K 4.0 3.7  CL 98 96  CO2 34* 33*  GLUCOSE 125* 124*  BUN 13 9  CREATININE 0.73 0.80  CALCIUM 8.9 9.2   PT/INR No results found for this basename: LABPROT, INR,  in the last 72 hours ABG No results found for this basename: PHART, PCO2, PO2, HCO3,  in the last 72 hours  Studies/Results: No results found.  Anti-infectives: Anti-infectives   Start     Dose/Rate Route Frequency Ordered Stop   08/14/13 2230  clindamycin (CLEOCIN) IVPB 900 mg     900 mg 100 mL/hr over 30 Minutes Intravenous 3 times per day 08/14/13 1941 08/14/13 2135   08/14/13 0600  clindamycin (CLEOCIN) IVPB 900 mg     900 mg 100 mL/hr over 30 Minutes Intravenous On call to O.R. 08/13/13 1705 08/14/13 1430   08/14/13 0600  gentamicin (GARAMYCIN) 490 mg in dextrose 5 % 100 mL IVPB     490 mg 112.3 mL/hr over 60 Minutes Intravenous On call to O.R. 08/13/13 1705 08/14/13 0917      Assessment/Plan: s/p  Procedure(s): LAPAROSCOPIC PARTIAL COLECTOMY wound vac placement (N/A) He looks okay.  No additional bowel movements but tolerating full liquids.  should be okay to advance diet and likely remove vac and home tomorrow if he continues to do well.  LOS: 4 days    John Hubbard 08/18/2013

## 2013-08-19 ENCOUNTER — Telehealth (INDEPENDENT_AMBULATORY_CARE_PROVIDER_SITE_OTHER): Payer: Self-pay

## 2013-08-19 DIAGNOSIS — C189 Malignant neoplasm of colon, unspecified: Secondary | ICD-10-CM

## 2013-08-19 MED ORDER — OXYCODONE HCL 5 MG PO TABS: 5.0000 mg | ORAL_TABLET | ORAL | Status: DC | PRN

## 2013-08-19 NOTE — Discharge Summary (Signed)
Physician Discharge Summary  Patient ID: John Hubbard MRN: [id number] DOB/AGE: 05-Sep-1961 52 y.o.  Admit date: 08/14/2013 Discharge date: 08/19/2013  Admission Diagnoses: Colon cancer  Discharge Diagnoses:  Principal Problem:   Colon cancer   Discharged Condition: good  Hospital Course: The patient was admitted after LAR for distal sigmoid cancer and resection of a rectosigmoid polyp.  He tolerated the procedure well.  His diet was advanced to clears on POD 1 and Full liquids on POD 2.  His foley was removed by POD 2.  He had a BM on POD 3 and his diet was advanced to regular on POD 4.  By POD 5 he was tolerating a diet and his pain was controlled with oral medications.  His wound vac and JP were removed on POD 5 as well.    Consults: None  Significant Diagnostic Studies: labs: CBC, chemistry  Treatments: IV hydration, analgesia: acetaminophen w/ codeine and surgery: LAR  Discharge Exam: Blood pressure 131/91, pulse 100, temperature 98.4 F (36.9 C), temperature source Oral, resp. rate 18, height 5\' 10"  (1.778 m), weight 299 lb 13.2 oz (136 kg), SpO2 95.00%. General appearance: alert and cooperative GI: normal findings: soft, non-tender Incision/Wound: clean, dry, no purulent drainage, no erythema JP: serous drainage  Disposition: 01-Home or Self Care       Future Appointments Provider Department Dept Phone   09/03/2013 12:00 PM Romie Levee, MD [facility], [place] [phone]       Medication List    ASK your doctor about these medications       allopurinol 300 MG tablet  Commonly known as:  ZYLOPRIM  Take 300 mg by mouth daily.     carbamazepine 200 MG tablet  Commonly known as:  TEGRETOL  Take 200 mg by mouth 2 (two) times daily.     metroNIDAZOLE 500 MG tablet  Commonly known as:  FLAGYL  Take 500 mg by mouth as directed.       Follow-up Information   Follow up with Vanita Panda., MD. Schedule an appointment as soon as possible for a  visit in 1 week.   Specialty:  General Surgery   Contact information:   [address] [place] [place] [address] [phone]       Signed: Vanita Panda 08/19/2013, 9:11 AM

## 2013-08-19 NOTE — Telephone Encounter (Signed)
Message copied by Ivory Broad on Mon Aug 19, 2013  1:43 PM ------      Message from: [place], Broadus John.      Created: Mon Aug 19, 2013  9:06 AM      Regarding: f/u apt       He needs an apt next week with me for staple removal.  He also needs a referral to [facility] for stage 3 colon cancer.            AT ------

## 2013-08-19 NOTE — Telephone Encounter (Signed)
Left message for pt to call.  I have scheduled him for 11/25 at 0930 for staple removal.  I will put in for an oncology referral and Dr Kalman Drape office will call him.

## 2013-08-20 NOTE — Telephone Encounter (Signed)
I called the pt and he is doing well.  He is glad to be through the surgery.  I confirmed his po appointment with him.  I told him we made the Oncology referrals for him and they will be in touch with him.

## 2013-08-23 ENCOUNTER — Telehealth: Payer: Self-pay | Admitting: *Deleted

## 2013-08-23 ENCOUNTER — Ambulatory Visit (INDEPENDENT_AMBULATORY_CARE_PROVIDER_SITE_OTHER): Payer: Federal, State, Local not specified - PPO | Admitting: General Surgery

## 2013-08-23 ENCOUNTER — Encounter (INDEPENDENT_AMBULATORY_CARE_PROVIDER_SITE_OTHER): Payer: Self-pay | Admitting: General Surgery

## 2013-08-23 VITALS — BP 154/100 | HR 88 | Temp 98.4°F | Resp 16 | Ht 70.0 in | Wt 287.0 lb

## 2013-08-23 DIAGNOSIS — T8140XA Infection following a procedure, unspecified, initial encounter: Secondary | ICD-10-CM

## 2013-08-23 DIAGNOSIS — IMO0001 Reserved for inherently not codable concepts without codable children: Secondary | ICD-10-CM

## 2013-08-23 MED ORDER — SULFAMETHOXAZOLE-TRIMETHOPRIM 400-80 MG PO TABS: 1.0000 | ORAL_TABLET | Freq: Two times a day (BID) | ORAL | Status: AC

## 2013-08-23 NOTE — Progress Notes (Signed)
Subjective:     Patient ID: John Hubbard, male   DOB: 05-17-1961, 52 y.o.   MRN: [id number]  HPI 52 year old morbidly obese Caucasian male status post Lap assisted low anterior resection By Dr. Maisie Fus on November 12 Comes in because of increased drainage from his low tranverse incision. He was discharged from the hospital on November 17. He denies any fever, chills, nausea or vomiting. He reports normal bowel movements. He reports his energy level is slowly increasing. He reports his appetite is slowly improving as well. He reports that the drainage has been light yellow and bloody. He also reports that his incision is more puffy  Review of Systems     Objective:   Physical Exam BP 154/100  Pulse 88  Temp(Src) 98.4 F (36.9 C) (Temporal)  Resp 16  Ht 5\' 10"  (1.778 m)  Wt 287 lb (130.182 kg)  BMI 41.18 kg/m2 Alert, no apparent distress nontoxic Lower transverse abdominal wall incision has extensive cellulitis mainly superior to his incision extending for about 15 cm transversely and about 6 cm superiorly. It was outlined.    Assessment:     Status post laparoscopic assisted low anterior resection  Surgical site cellulitis    Plan:     It appears he has developed a superficial surgical site infection. I removed all of the skin staples. The wound was opened up. There is drainage of blood-tinged seroma. There is no purulent material. It appears that this is mainly a seroma with hematoma. The patient's wife was instructed on wet-to-dry dressing changes. He was placed on Bactrim double strength one tablet twice a day for one week. We will arrange home health nursing to hopefully start on Monday. They were instructed to call for fever, worsening redness, purulent drainage, fever chills or any additional questions.   Mary Sella. Andrey Campanile, MD, FACS General, Bariatric, & Minimally Invasive Surgery [facility], [place]

## 2013-08-23 NOTE — Patient Instructions (Signed)
You developed cellulitis of your surgical wound due to a seroma/hematoma.    Gauze has been packed into the space to provide a drain that will allow the cavity to heal from the inside outwards.   HOME CARE INSTRUCTIONS   Only take over-the-counter or prescription medicines for pain, discomfort, or fever as directed by your caregiver.   When you bathe, soak and then remove gauze or iodoform packs. You may then wash the wound gently with mild soapy water. Repack with moist gauze, leave some on the outside so that you can use it to pull remaining gauze out of wound. Cover with gauze or do as your caregiver directs.   SEEK IMMEDIATE MEDICAL CARE IF:   You develop increased pain, swelling, redness, drainage, or bleeding in the wound site.   You develop signs of generalized infection including muscle aches, chills, fever, or a general ill feeling.   An oral temperature above 102 F (38.9 C) develops, not controlled by medication.  See your caregiver for a recheck if you develop any of the symptoms described above. If medications (antibiotics) were prescribed, take them as directed.   Cellulitis Cellulitis is an infection of the skin and the tissue beneath it. The infected area is usually red and tender. Cellulitis occurs most often in the arms and lower legs.  CAUSES  Cellulitis is caused by bacteria that enter the skin through cracks or cuts in the skin. The most common types of bacteria that cause cellulitis are Staphylococcus and Streptococcus. SYMPTOMS   Redness and warmth.  Swelling.  Tenderness or pain.  Fever. DIAGNOSIS  Your caregiver can usually determine what is wrong based on a physical exam. Blood tests may also be done. TREATMENT  Treatment usually involves taking an antibiotic medicine. HOME CARE INSTRUCTIONS   Take your antibiotics as directed. Finish them even if you start to feel better.  Keep the infected arm or leg elevated to reduce swelling.  Apply a warm  cloth to the affected area up to 4 times per day to relieve pain.  Only take over-the-counter or prescription medicines for pain, discomfort, or fever as directed by your caregiver.  Keep all follow-up appointments as directed by your caregiver. SEEK MEDICAL CARE IF:   You notice red streaks coming from the infected area.  Your red area gets larger or turns dark in color.  Your bone or joint underneath the infected area becomes painful after the skin has healed.  Your infection returns in the same area or another area.  You notice a swollen bump in the infected area.  You develop new symptoms. SEEK IMMEDIATE MEDICAL CARE IF:   You have a fever.  You feel very sleepy.  You develop vomiting or diarrhea.  You have a general ill feeling (malaise) with muscle aches and pains. MAKE SURE YOU:   Understand these instructions.  Will watch your condition.  Will get help right away if you are not doing well or get worse. Document Released: 06/29/2005 Document Revised: 03/20/2012 Document Reviewed: 12/05/2011 [facility] Patient Information 2014 [place], [place].

## 2013-08-23 NOTE — Telephone Encounter (Signed)
Attempted to reach patient without success.  Will continue to try.

## 2013-08-23 NOTE — Telephone Encounter (Signed)
Spoke with patient and confirmed appointment with Dr. Truett Perna for 09/03/13.  Explained role of nurse navigator.  Contact names and numbers were provided.

## 2013-08-26 ENCOUNTER — Telehealth (INDEPENDENT_AMBULATORY_CARE_PROVIDER_SITE_OTHER): Payer: Self-pay | Admitting: General Surgery

## 2013-08-26 ENCOUNTER — Encounter (HOSPITAL_COMMUNITY): Payer: Self-pay

## 2013-08-26 MED ORDER — OXYCODONE HCL 5 MG PO TABS: 5.0000 mg | ORAL_TABLET | ORAL | Status: DC | PRN

## 2013-08-26 NOTE — Telephone Encounter (Signed)
I called the pt and confirmed that he is out of his pain medicine.  He was doing ok until he came in Friday and his incision had to be opened up.  He will see Dr Maisie Fus tomorrow but he can't wait until then.  I got Dr Biagio Quint to write his prescription.  He wrote Oxycodone IR 5 mg po one  q 4-6 hrs prn pain #40, no refill.  The pt is notified to pick it up.

## 2013-08-26 NOTE — Telephone Encounter (Signed)
Called requesting oxycodone refill. Otherwise doing very well. Clinical pool asked to get Rx written this am and call patient to pick up.  Angelia Mould. Derrell Lolling, M.D., [facility] General and Minimally invasive Surgery Breast and Colorectal Surgery Office:   [phone]

## 2013-08-27 ENCOUNTER — Ambulatory Visit (INDEPENDENT_AMBULATORY_CARE_PROVIDER_SITE_OTHER): Payer: Federal, State, Local not specified - PPO | Admitting: General Surgery

## 2013-08-27 ENCOUNTER — Encounter (INDEPENDENT_AMBULATORY_CARE_PROVIDER_SITE_OTHER): Payer: Self-pay | Admitting: General Surgery

## 2013-08-27 VITALS — BP 132/80 | HR 92 | Temp 98.0°F | Resp 14 | Ht 70.0 in | Wt 281.2 lb

## 2013-08-27 DIAGNOSIS — Z9889 Other specified postprocedural states: Secondary | ICD-10-CM

## 2013-08-27 NOTE — Progress Notes (Signed)
John Hubbard is a 53 y.o. male who is status post a LAR on 11/12 for a distal sigmoid cancer.  This was complicated by a superficial wound infection.  It was drained in the office on Friday. The patient's wife has been packing this daily. He is eating well and having good bowel movements.   Objective: Filed Vitals:   08/27/13 0950  BP: 132/80  Pulse: 92  Temp: 98 F (36.7 C)  Resp: 14    General appearance: alert and cooperative GI: normal findings: soft, non-tender  Incision: dehiscence present left lateral portion of the wound. He has an approximate 5 cm open portion that is approximately 1 cm deep at the deepest portion of the wound. The edges are clean with good granulation tissue.   Assessment: s/p  Patient Active Problem List   Diagnosis Date Noted  . Colon cancer 07/31/2013    Plan: Stage III colon cancer- will need port placement. Will schedule this within the next couple weeks.   Wound infection: Complete antibiotic course, continue to pack daily.      Vanita Panda, MD [facility], [place] [phone]   08/27/2013 9:53 AM

## 2013-09-03 ENCOUNTER — Telehealth: Payer: Self-pay | Admitting: Oncology

## 2013-09-03 ENCOUNTER — Encounter: Payer: Self-pay | Admitting: *Deleted

## 2013-09-03 ENCOUNTER — Encounter (INDEPENDENT_AMBULATORY_CARE_PROVIDER_SITE_OTHER): Payer: Self-pay | Admitting: General Surgery

## 2013-09-03 ENCOUNTER — Encounter: Payer: Self-pay | Admitting: Oncology

## 2013-09-03 ENCOUNTER — Other Ambulatory Visit: Payer: Self-pay | Admitting: *Deleted

## 2013-09-03 ENCOUNTER — Ambulatory Visit (INDEPENDENT_AMBULATORY_CARE_PROVIDER_SITE_OTHER): Payer: Federal, State, Local not specified - PPO | Admitting: General Surgery

## 2013-09-03 ENCOUNTER — Ambulatory Visit: Payer: Federal, State, Local not specified - PPO

## 2013-09-03 ENCOUNTER — Ambulatory Visit (HOSPITAL_BASED_OUTPATIENT_CLINIC_OR_DEPARTMENT_OTHER): Payer: Federal, State, Local not specified - PPO | Admitting: Oncology

## 2013-09-03 VITALS — BP 142/79 | HR 86 | Temp 97.2°F | Resp 20 | Ht 70.0 in | Wt 290.4 lb

## 2013-09-03 VITALS — BP 158/100 | HR 88 | Temp 98.0°F | Resp 15 | Ht 70.0 in | Wt 288.2 lb

## 2013-09-03 DIAGNOSIS — T814XXD Infection following a procedure, subsequent encounter: Secondary | ICD-10-CM

## 2013-09-03 DIAGNOSIS — C189 Malignant neoplasm of colon, unspecified: Secondary | ICD-10-CM

## 2013-09-03 DIAGNOSIS — C187 Malignant neoplasm of sigmoid colon: Secondary | ICD-10-CM

## 2013-09-03 DIAGNOSIS — Z5189 Encounter for other specified aftercare: Secondary | ICD-10-CM

## 2013-09-03 NOTE — Progress Notes (Signed)
[facility] New Patient Consult   Referring MD: Avrom Robarts 52 y.o.  07-Feb-1961    Reason for Referral: Colon cancer     HPI: He reports an approximate 2 year history of rectal bleeding. He saw Dr. Derrell Lolling for repair of an incarcerated umbilical hernia in September of 2014. He was referred to Dr. Maisie Fus for a colonoscopy on 07/24/2013. A polyp was found in the cecum. A polyp was removed. A 3 mm polyp was found in the left colon. This was not biopsied because it was 5 cm distal to a sigmoid mass. The sigmoid mass was biopsied. Multiple polyps were found in the descending colon. The polyps were removed. The sigmoid mass was tattooed. The pathology ([id number]) confirmed a tubular adenoma at the cecum. The left colon polyps returned as 2 tubular adenomas and a benign nondisplaced polyp. The biopsy of the sigmoid colon mass revealed invasive poorly differential carcinoma with mucinous and cigarette cell differentiation.  CTs of the chest, abdomen, and pelvis on 07/29/2013 revealed no suspicious pulmonary nodules. No focal hepatic lesion. No retroperitoneal or periportal lymphadenopathy. Small retrocrural nodes measuring less than 2 mm were unchanged compared to a CT from 05/02/2013. No peritoneal disease. No pelvic lymphadenopathy.  He was taken the operating room by Dr. Maisie Fus on 08/14/2013 and underwent a low anterior resection and placement of a wound VAC. The tattoo was noted at the peritoneal reflection. Tumor was noted at the distal sigmoid colon.  The pathology ([id number]) revealed a poorly differentiated invasive adenocarcinoma with signet ring cell and mucinous differentiation. Tumor extended into the subserosal tissue. The resection margins were negative. Lymphovascular and perineural invasion were present. 3 of 25 lymph nodes contained micrometastases. There was an additional tubular adenoma without high-grade dysplasia. Tumor was located in the  sigmoid colon. Macroscopic tumor perforation was not identified. The tumor returned microsatellite stable with no loss of mismatch repair protein expression.   The low abdominal wound continues to heal. A packing remains in place.  Past Medical History  Diagnosis Date  . Hypertension   . Anxiety    .    History of excessive sweating  .    Gout  Past Surgical History  Procedure Laterality Date  . Hernia repair  06/19/13    Umbilical Hernia Repair  . Colonoscopy Bilateral 07/24/2013    Procedure: COLONOSCOPY;  Surgeon: Romie Levee, MD;  Location: [facility];  Service: Endoscopy;  Laterality: Bilateral;  . Laparoscopic partial colectomy N/A 08/14/2013    Procedure: LAPAROSCOPIC PARTIAL COLECTOMY wound vac placement;  Surgeon: Romie Levee, MD;  Location: [facility];  Service: General;  Laterality: N/A;    Family history: His mother had "skin cancer "at age 71. No other family history of cancer. No children. One sister, 1/2 sister, 1/2 brother   Current outpatient prescriptions:acetaminophen (TYLENOL) 500 MG tablet, Take 500 mg by mouth 2 (two) times daily as needed., Disp: , Rfl: ;  allopurinol (ZYLOPRIM) 300 MG tablet, Take 300 mg by mouth daily. , Disp: , Rfl: ;  carbamazepine (TEGRETOL) 200 MG tablet, Take 200 mg by mouth 2 (two) times daily., Disp: , Rfl:  oxyCODONE (OXY IR/ROXICODONE) 5 MG immediate release tablet, Take 1 tablet (5 mg total) by mouth every 4 (four) hours as needed for severe pain., Disp: 40 tablet, Rfl: 0;  sodium chloride irrigation 0.9 % irrigation, , Disp: , Rfl:   Allergies:  Allergies  Allergen Reactions  . Penicillins Hives    Social History:  He lives with his wife in [place]. 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 She does not use cigarettes. He drinks wine on rare occasion. No transfusion history. No risk factor for HIV or hepatitis.   ROS:   Positives include: 20 pound weight loss since surgery, occasional night sweats,  tingling in the hands and forearms following surgery, snoring, wound infection after surgery, episode of bronchitis prior to hernia surgery  A complete ROS was otherwise negative.  Physical Exam:  Blood pressure 142/79, pulse 86, temperature 97.2 F (36.2 C), temperature source Oral, resp. rate 20, height 5\' 10"  (1.778 m), weight 290 lb 6.4 oz (131.725 kg).  HEENT: Oropharynx without visible mass, neck without mass Lungs: Clear bilaterally Cardiac: Regular rate and rhythm Abdomen: No hepatosplenomegaly, no mass, nontender, low transverse incision with an opening at the left side with a packing in place GU: Testes without mass  Vascular: No leg edema Lymph nodes: No cervical, supraclavicular, axillary, or inguinal nodes Neurologic: Alert and oriented, the motor exam appears intact in the upper and lower extremities Skin: Confluence erythema over the mid and lower back. Musculoskeletal: No spine tenderness   LAB:  CBC  Lab Results  Component Value Date   WBC 10.9* 08/17/2013   HGB 11.1* 08/17/2013   HCT 32.0* 08/17/2013   MCV 84.2 08/17/2013   PLT 181 08/17/2013   08/09/2013-hemoglobin 15.1, MCV 84  CMP      Component Value Date/Time   NA 135 08/17/2013 0350   K 3.7 08/17/2013 0350   CL 96 08/17/2013 0350   CO2 33* 08/17/2013 0350   GLUCOSE 124* 08/17/2013 0350   BUN 9 08/17/2013 0350   CREATININE 0.80 08/17/2013 0350   CALCIUM 9.2 08/17/2013 0350   GFRNONAA >90 08/17/2013 0350   GFRAA >90 08/17/2013 0350   CEA on 07/31/2013-1.8  Radiology: As per history of present illness    Assessment/Plan:   1. 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 The tumor returned microsatellite stable with no loss of mismatch repair protein expression.  2. Healing low anterior wound with a packing in place  3. Gout  4. History of anxiety-maintained on Tegretol  5.  Hypertension   Disposition:   John Hubbard has been diagnosed with stage III colon cancer. I discussed the diagnosis and adjuvant treatment options with John Hubbard and his wife. We reviewed the details of the surgical pathology report. I explained data confirming the benefit of adjuvant 5-fluorouracil and oxaliplatin chemotherapy in this setting. I recommend adjuvant FOLFOX or CAPOX. We discussed the CALGB 40981 study which randomizes patients to 6 versus 12 cycles of adjuvant FOLFOX chemotherapy. This is followed by a second randomization to placebo or celecoxib. He met with a research nurse today.   We discussed the delivery timing in the FOLFOX and CAPOX regimens. We reviewed the specific toxicities associated with these chemotherapy regimens including the chance for nausea/vomiting, mucositis, diarrhea, and hematologic toxicity. We discussed the hyperpigmentation and hand/foot syndrome associated with 5-fluorouracil and capecitabine. We reviewed the various types of neuropathy seen with oxaliplatin.  Mr. Buxton will attend a chemotherapy teaching class. He is scheduled for placement of a Port-A-Cath on 09/16/2013. He will be scheduled for a first cycle of chemotherapy on 09/18/2013.  He will contact us within the next several days with his decision regarding enrollment on the CALGB study. He indicated that he will be most comfortable with CAPOX chemotherapy if  he decides against the clinical trial.  We discussed diet and exercise maneuvers to potentially decrease the risk of developing colon cancer. He should have a surveillance colonoscopy in approximately one year.  I will discuss his case at the GI tumor conference on 09/04/2013. The tumor appears to have originated in the sigmoid colon. We will confirm this with Dr. Maisie Fus and discuss the indication for radiation with Dr. Mitzi Hansen.  Approximately 50 minutes were spent with the patient today. The majority of the time was used for counseling and  coordination of care.  Shalandra Leu 09/03/2013, 6:06 PM

## 2013-09-03 NOTE — Progress Notes (Signed)
Checked in new patient with no financial issues. He has appt card. °

## 2013-09-03 NOTE — Patient Instructions (Signed)
Schedule an apt to see your PCP.  We will plan on placing your mediport on 12/15

## 2013-09-03 NOTE — Telephone Encounter (Signed)
Gave pt apptr for lab,md, chemo class and nutrition  for December 2014, emailed michelle regarding chemo

## 2013-09-03 NOTE — Progress Notes (Signed)
John Hubbard is a 52 y.o. male who is status post a LAR on 11/12.  He developed a superficial wound infection and has been packing this daily. Otherwise he is doing well. He is having multiple bowel movements a day. He is tolerating a good diet and urinating well. He complains of some numbness and tingling in his forearms that appears to be positional. He also complains of some weakness upon standing for long periods of time.  Objective: Filed Vitals:   09/03/13 1217  BP: 158/100  Pulse: 88  Temp: 98 F (36.7 C)  Resp: 15    General appearance: alert and cooperative GI: normal findings: soft, non-tender   Incision: granulation tissue forming well.   Assessment: s/p  Patient Active Problem List   Diagnosis Date Noted  . Colon cancer 07/31/2013    Plan: Continue packing wound daily. Patient has a schedule a followup appointment with his primary care physician to discuss his high blood pressure as well as his weakness upon standing.  Given the bilateral neck nature of his neuropathy issues, I do not think is related to surgery, but it could be. Either way, it appears to be getting better, and I think we should just give it some time.    Vanita Panda, MD [facility], [place] [phone]   09/03/2013 12:34 PM

## 2013-09-03 NOTE — Progress Notes (Signed)
Met with Matthias Hughs and his wife. Explained role of nurse navigator. Educational information provided on colon cancer   [facility] resource information provided to patient, including SW service and GI support group information.  Referral made to dietician for diet education.  Contact names and phone numbers were provided for entire [facility] team.  Teach back method was used.  No barriers to care identified at present time.  Will continue to follow as needed.

## 2013-09-05 ENCOUNTER — Telehealth: Payer: Self-pay | Admitting: *Deleted

## 2013-09-05 ENCOUNTER — Telehealth: Payer: Self-pay | Admitting: Oncology

## 2013-09-05 NOTE — Telephone Encounter (Signed)
Talked to pt's wife gave her appt calendar appts for December 2014

## 2013-09-05 NOTE — Telephone Encounter (Signed)
Per staff message and POF I have scheduled appts.  JMW  

## 2013-09-06 ENCOUNTER — Telehealth: Payer: Self-pay | Admitting: *Deleted

## 2013-09-06 NOTE — Telephone Encounter (Signed)
09/06/2013 Received telephone call from patient today stating that he has considered the CALGB 16109 clinical trial, but has decided against participating. He states that he prefers a schedule that will minimize his visits to the Cancer Center. Notification sent to education nurse, Vicie Mutters and Dr. Truett Perna via email. Thanked patient for his willingness to consider participation in the study. Cindy S. Clelia Croft BSN, RN, CCRP 09/06/2013 9:10 AM

## 2013-09-10 ENCOUNTER — Telehealth (INDEPENDENT_AMBULATORY_CARE_PROVIDER_SITE_OTHER): Payer: Self-pay

## 2013-09-10 ENCOUNTER — Other Ambulatory Visit (HOSPITAL_BASED_OUTPATIENT_CLINIC_OR_DEPARTMENT_OTHER): Payer: Federal, State, Local not specified - PPO

## 2013-09-10 ENCOUNTER — Ambulatory Visit: Payer: Federal, State, Local not specified - PPO | Admitting: Oncology

## 2013-09-10 ENCOUNTER — Other Ambulatory Visit: Payer: Self-pay | Admitting: Oncology

## 2013-09-10 ENCOUNTER — Other Ambulatory Visit: Payer: Federal, State, Local not specified - PPO

## 2013-09-10 ENCOUNTER — Encounter: Payer: Self-pay | Admitting: *Deleted

## 2013-09-10 ENCOUNTER — Ambulatory Visit: Payer: Federal, State, Local not specified - PPO | Admitting: Nutrition

## 2013-09-10 ENCOUNTER — Ambulatory Visit: Payer: Federal, State, Local not specified - PPO

## 2013-09-10 DIAGNOSIS — C189 Malignant neoplasm of colon, unspecified: Secondary | ICD-10-CM

## 2013-09-10 DIAGNOSIS — C187 Malignant neoplasm of sigmoid colon: Secondary | ICD-10-CM

## 2013-09-10 LAB — COMPREHENSIVE METABOLIC PANEL (CC13)
ALT: 35 U/L (ref 0–55)
AST: 19 U/L (ref 5–34)
Albumin: 3.9 g/dL (ref 3.5–5.0)
Alkaline Phosphatase: 157 U/L — ABNORMAL HIGH (ref 40–150)
Calcium: 10 mg/dL (ref 8.4–10.4)
Chloride: 100 mEq/L (ref 98–109)
Creatinine: 0.8 mg/dL (ref 0.7–1.3)
Potassium: 4.2 mEq/L (ref 3.5–5.1)
Sodium: 142 mEq/L (ref 136–145)
Total Protein: 7.6 g/dL (ref 6.4–8.3)

## 2013-09-10 LAB — CBC WITH DIFFERENTIAL/PLATELET
BASO%: 0.9 % (ref 0.0–2.0)
Basophils Absolute: 0.1 10*3/uL (ref 0.0–0.1)
Eosinophils Absolute: 0.3 10*3/uL (ref 0.0–0.5)
HCT: 40.6 % (ref 38.4–49.9)
HGB: 13.5 g/dL (ref 13.0–17.1)
LYMPH%: 36.8 % (ref 14.0–49.0)
MCHC: 33.3 g/dL (ref 32.0–36.0)
MONO#: 0.4 10*3/uL (ref 0.1–0.9)
NEUT%: 51.6 % (ref 39.0–75.0)
Platelets: 228 10*3/uL (ref 140–400)
RBC: 4.93 10*6/uL (ref 4.20–5.82)
WBC: 6.3 10*3/uL (ref 4.0–10.3)
lymph#: 2.3 10*3/uL (ref 0.9–3.3)

## 2013-09-10 NOTE — Telephone Encounter (Signed)
Patient's wife called me back.  She states the patient's wound is still open.  It bleeds when they pull the packing out.  There is no pus, no pain and no fever.  His skin is not red around it.  The only thing is that there is an odor to it.  It is also hard in the area.  She is worried he is infected and he gets his port inserted next week.  She doesn't want to delay his chemo treatment.  I advised the fact that it is an open wound it is infected.  I will ask Dr Maisie Fus if she thinks he needs antibiotics or if she wants to see him this week.

## 2013-09-10 NOTE — Progress Notes (Signed)
52 year old male diagnosed with stage III Colon cancer S/P LAR on November 12.  Past medical hx includes HTN and Anxiety.  Medications were reviewed.  Labs include glucose of 124 on 11/15.  Height:  5' 10 " Weight: 290.4 lb. UBW: 300 lb. BMI:  41.67.  Patient reports he is eating a healthy diet.  He has tried to increase fruits and vegetables.  He enjoys most foods.  He had a superficial wound infection per MD and reports he continues to have to clean and pack his open wound.    Nutrition Diagnosis:   Food and Nutrition Related Knowledge Deficit related to diagnosis of colon cancer and associated treatments as evidenced by no prior need to for nutrition related information.  Intervention:   Educated patient on increasing protein in small frequent meals for weight maintenance or slow weight loss. Recommended increase protein foods and add one to two protein shakes daily. Add MVI daily. Provided written fact sheets and contact information.  Monitoring, evaluation, goals:   Consume adequate calories and protein to promote weight maintenance or slow, safe weight loss and adequate healing.  Next Visit:  Wednesday, December 17, during chemotherapy.

## 2013-09-10 NOTE — Telephone Encounter (Signed)
Received vm on manager Kay's voicemail regarding open wound.  I called back and got a voicemail.  I left a message for wife to call us back.

## 2013-09-11 ENCOUNTER — Encounter (HOSPITAL_BASED_OUTPATIENT_CLINIC_OR_DEPARTMENT_OTHER): Payer: Self-pay | Admitting: *Deleted

## 2013-09-11 ENCOUNTER — Other Ambulatory Visit: Payer: Self-pay | Admitting: *Deleted

## 2013-09-11 ENCOUNTER — Encounter: Payer: Self-pay | Admitting: Oncology

## 2013-09-11 MED ORDER — PROCHLORPERAZINE MALEATE 10 MG PO TABS: 10.0000 mg | ORAL_TABLET | Freq: Four times a day (QID) | ORAL | Status: DC | PRN

## 2013-09-11 MED ORDER — CAPECITABINE 500 MG PO TABS: ORAL_TABLET | ORAL | Status: DC

## 2013-09-11 NOTE — Progress Notes (Signed)
Faxed xeloda prescription to [facility] °

## 2013-09-11 NOTE — Telephone Encounter (Signed)
Agreed.  Sounds like normal healing process.  Bleeding is to be expected.  Hard areas are scar tissue.  Will take several more weeks to close

## 2013-09-11 NOTE — Telephone Encounter (Signed)
I called and notified the wife Selena Batten of Dr Maisie Fus' message.  She asked if chemo should be delayed and I told her that is up to the Oncologist.  I told her Dr Maisie Fus didn't say she was concerned and the port insertion is still scheduled for next week.

## 2013-09-15 ENCOUNTER — Other Ambulatory Visit: Payer: Self-pay | Admitting: Oncology

## 2013-09-16 ENCOUNTER — Encounter (HOSPITAL_BASED_OUTPATIENT_CLINIC_OR_DEPARTMENT_OTHER): Payer: Self-pay | Admitting: *Deleted

## 2013-09-16 ENCOUNTER — Ambulatory Visit (HOSPITAL_COMMUNITY): Payer: Federal, State, Local not specified - PPO

## 2013-09-16 ENCOUNTER — Encounter (HOSPITAL_BASED_OUTPATIENT_CLINIC_OR_DEPARTMENT_OTHER)
Admission: RE | Disposition: A | Discharge status: Home / Self Care | Payer: Self-pay | Source: Ambulatory Visit | Attending: General Surgery

## 2013-09-16 ENCOUNTER — Encounter (HOSPITAL_BASED_OUTPATIENT_CLINIC_OR_DEPARTMENT_OTHER): Payer: Federal, State, Local not specified - PPO | Admitting: Anesthesiology

## 2013-09-16 ENCOUNTER — Ambulatory Visit (HOSPITAL_BASED_OUTPATIENT_CLINIC_OR_DEPARTMENT_OTHER): Payer: Federal, State, Local not specified - PPO | Admitting: Anesthesiology

## 2013-09-16 ENCOUNTER — Telehealth: Payer: Self-pay | Admitting: *Deleted

## 2013-09-16 ENCOUNTER — Ambulatory Visit (HOSPITAL_BASED_OUTPATIENT_CLINIC_OR_DEPARTMENT_OTHER)
Admission: RE | Admit: 2013-09-16 | Discharge: 2013-09-16 | Disposition: A | Discharge status: Home / Self Care | Payer: Federal, State, Local not specified - PPO | Source: Ambulatory Visit | Attending: General Surgery | Admitting: General Surgery

## 2013-09-16 DIAGNOSIS — F411 Generalized anxiety disorder: Secondary | ICD-10-CM | POA: Insufficient documentation

## 2013-09-16 DIAGNOSIS — Z79899 Other long term (current) drug therapy: Secondary | ICD-10-CM | POA: Insufficient documentation

## 2013-09-16 DIAGNOSIS — I10 Essential (primary) hypertension: Secondary | ICD-10-CM | POA: Insufficient documentation

## 2013-09-16 DIAGNOSIS — C189 Malignant neoplasm of colon, unspecified: Secondary | ICD-10-CM | POA: Insufficient documentation

## 2013-09-16 DIAGNOSIS — M109 Gout, unspecified: Secondary | ICD-10-CM | POA: Insufficient documentation

## 2013-09-16 DIAGNOSIS — Z9049 Acquired absence of other specified parts of digestive tract: Secondary | ICD-10-CM | POA: Insufficient documentation

## 2013-09-16 DIAGNOSIS — C19 Malignant neoplasm of rectosigmoid junction: Secondary | ICD-10-CM

## 2013-09-16 HISTORY — DX: Gout, unspecified: M10.9

## 2013-09-16 HISTORY — PX: PORTACATH PLACEMENT: SHX2246

## 2013-09-16 SURGERY — INSERTION, TUNNELED CENTRAL VENOUS DEVICE, WITH PORT
Anesthesia: General | Site: Chest | Laterality: Left

## 2013-09-16 MED ORDER — HEPARIN SOD (PORK) LOCK FLUSH 100 UNIT/ML IV SOLN
INTRAVENOUS | Status: DC | PRN
  Administered 2013-09-16: 500 [IU] via INTRAVENOUS

## 2013-09-16 MED ORDER — HEPARIN (PORCINE) LOCK FLUSH 2 UNIT/ML IV SOLN
INTRAVENOUS | Status: DC | PRN
  Administered 2013-09-16: 500 mL

## 2013-09-16 MED ORDER — OXYCODONE HCL 5 MG/5ML PO SOLN: 5.0000 mg | Freq: Once | ORAL | Status: DC | PRN

## 2013-09-16 MED ORDER — ACETAMINOPHEN 325 MG PO TABS: 650.0000 mg | ORAL_TABLET | ORAL | Status: DC | PRN

## 2013-09-16 MED ORDER — LACTATED RINGERS IV SOLN
INTRAVENOUS | Status: DC
  Administered 2013-09-16 (×2): via INTRAVENOUS

## 2013-09-16 MED ORDER — HYDROMORPHONE HCL PF 1 MG/ML IJ SOLN: 0.2500 mg | INTRAMUSCULAR | Status: DC | PRN

## 2013-09-16 MED ORDER — SODIUM CHLORIDE 0.9 % IJ SOLN: 3.0000 mL | Freq: Two times a day (BID) | INTRAMUSCULAR | Status: DC

## 2013-09-16 MED ORDER — BUPIVACAINE-EPINEPHRINE 0.5% -1:200000 IJ SOLN
INTRAMUSCULAR | Status: DC | PRN
  Administered 2013-09-16: 3 mL

## 2013-09-16 MED ORDER — PROPOFOL 10 MG/ML IV BOLUS
INTRAVENOUS | Status: AC
  Filled 2013-09-16: qty 80

## 2013-09-16 MED ORDER — PROPOFOL 10 MG/ML IV BOLUS
INTRAVENOUS | Status: DC | PRN
  Administered 2013-09-16: 300 mg via INTRAVENOUS

## 2013-09-16 MED ORDER — CHLORHEXIDINE GLUCONATE 4 % EX LIQD: 1.0000 "application " | Freq: Once | CUTANEOUS | Status: DC

## 2013-09-16 MED ORDER — FENTANYL CITRATE 0.05 MG/ML IJ SOLN
INTRAMUSCULAR | Status: DC | PRN
  Administered 2013-09-16: 100 ug via INTRAVENOUS

## 2013-09-16 MED ORDER — ONDANSETRON HCL 4 MG/2ML IJ SOLN: 4.0000 mg | Freq: Four times a day (QID) | INTRAMUSCULAR | Status: DC | PRN

## 2013-09-16 MED ORDER — SODIUM CHLORIDE 0.9 % IV SOLN: 250.0000 mL | INTRAVENOUS | Status: DC | PRN

## 2013-09-16 MED ORDER — LIDOCAINE HCL (CARDIAC) 20 MG/ML IV SOLN
INTRAVENOUS | Status: DC | PRN
  Administered 2013-09-16 (×2): 100 mg via INTRAVENOUS

## 2013-09-16 MED ORDER — DEXAMETHASONE SODIUM PHOSPHATE 4 MG/ML IJ SOLN
INTRAMUSCULAR | Status: DC | PRN
  Administered 2013-09-16: 10 mg via INTRAVENOUS

## 2013-09-16 MED ORDER — ONDANSETRON HCL 4 MG/2ML IJ SOLN: 4.0000 mg | Freq: Once | INTRAMUSCULAR | Status: DC | PRN

## 2013-09-16 MED ORDER — LIDOCAINE-PRILOCAINE 2.5-2.5 % EX CREA: 1.0000 "application " | TOPICAL_CREAM | CUTANEOUS | Status: DC | PRN

## 2013-09-16 MED ORDER — SUCCINYLCHOLINE CHLORIDE 20 MG/ML IJ SOLN
INTRAMUSCULAR | Status: AC
  Filled 2013-09-16: qty 1

## 2013-09-16 MED ORDER — HEPARIN (PORCINE) IN NACL 2-0.9 UNIT/ML-% IJ SOLN
INTRAMUSCULAR | Status: AC
  Filled 2013-09-16: qty 500

## 2013-09-16 MED ORDER — HEPARIN SOD (PORK) LOCK FLUSH 100 UNIT/ML IV SOLN
INTRAVENOUS | Status: AC
  Filled 2013-09-16: qty 5

## 2013-09-16 MED ORDER — BUPIVACAINE-EPINEPHRINE PF 0.5-1:200000 % IJ SOLN
INTRAMUSCULAR | Status: AC
  Filled 2013-09-16: qty 30

## 2013-09-16 MED ORDER — CLINDAMYCIN PHOSPHATE 600 MG/50ML IV SOLN
600.0000 mg | INTRAVENOUS | Status: AC
  Administered 2013-09-16: 600 mg via INTRAVENOUS

## 2013-09-16 MED ORDER — ONDANSETRON HCL 4 MG/2ML IJ SOLN
INTRAMUSCULAR | Status: DC | PRN
  Administered 2013-09-16: 4 mg via INTRAVENOUS

## 2013-09-16 MED ORDER — ACETAMINOPHEN 650 MG RE SUPP: 650.0000 mg | RECTAL | Status: DC | PRN

## 2013-09-16 MED ORDER — ROCURONIUM BROMIDE 50 MG/5ML IV SOLN
INTRAVENOUS | Status: AC
  Filled 2013-09-16: qty 2

## 2013-09-16 MED ORDER — MIDAZOLAM HCL 5 MG/5ML IJ SOLN
INTRAMUSCULAR | Status: DC | PRN
  Administered 2013-09-16: 2 mg via INTRAVENOUS

## 2013-09-16 MED ORDER — OXYCODONE HCL 5 MG PO TABS: 5.0000 mg | ORAL_TABLET | Freq: Once | ORAL | Status: DC | PRN

## 2013-09-16 MED ORDER — PROPOFOL 10 MG/ML IV EMUL
INTRAVENOUS | Status: AC
  Filled 2013-09-16: qty 200

## 2013-09-16 MED ORDER — FENTANYL CITRATE 0.05 MG/ML IJ SOLN
INTRAMUSCULAR | Status: AC
  Filled 2013-09-16: qty 4

## 2013-09-16 MED ORDER — ROCURONIUM BROMIDE 50 MG/5ML IV SOLN
INTRAVENOUS | Status: AC
  Filled 2013-09-16: qty 1

## 2013-09-16 MED ORDER — MIDAZOLAM HCL 2 MG/2ML IJ SOLN
INTRAMUSCULAR | Status: AC
  Filled 2013-09-16: qty 2

## 2013-09-16 MED ORDER — CLINDAMYCIN PHOSPHATE 300 MG/50ML IV SOLN
INTRAVENOUS | Status: AC
  Filled 2013-09-16: qty 100

## 2013-09-16 MED ORDER — OXYCODONE HCL 5 MG PO TABS: 5.0000 mg | ORAL_TABLET | ORAL | Status: DC | PRN

## 2013-09-16 MED ORDER — SODIUM CHLORIDE 0.9 % IJ SOLN: 3.0000 mL | INTRAMUSCULAR | Status: DC | PRN

## 2013-09-16 MED ORDER — SUCCINYLCHOLINE CHLORIDE 20 MG/ML IJ SOLN
INTRAMUSCULAR | Status: DC | PRN
  Administered 2013-09-16: 200 mg via INTRAVENOUS

## 2013-09-16 SURGICAL SUPPLY — 44 items
BAG DECANTER FOR FLEXI CONT (MISCELLANEOUS) ×2 IMPLANT
BLADE HEX COATED 2.75 (ELECTRODE) ×2 IMPLANT
BLADE SURG 15 STRL LF DISP TIS (BLADE) ×1 IMPLANT
BLADE SURG 15 STRL SS (BLADE) ×1
CANISTER SUCT 1200ML W/VALVE (MISCELLANEOUS) IMPLANT
CHLORAPREP W/TINT 26ML (MISCELLANEOUS) ×2 IMPLANT
COVER MAYO STAND STRL (DRAPES) ×2 IMPLANT
COVER PROBE 5X48 (MISCELLANEOUS)
COVER TABLE BACK 60X90 (DRAPES) ×2 IMPLANT
DECANTER SPIKE VIAL GLASS SM (MISCELLANEOUS) IMPLANT
DERMABOND ADVANCED (GAUZE/BANDAGES/DRESSINGS) ×1
DERMABOND ADVANCED .7 DNX12 (GAUZE/BANDAGES/DRESSINGS) ×1 IMPLANT
DRAPE C-ARM 42X72 X-RAY (DRAPES) ×2 IMPLANT
DRAPE LAPAROSCOPIC ABDOMINAL (DRAPES) ×2 IMPLANT
DRAPE UTILITY XL STRL (DRAPES) ×2 IMPLANT
ELECT REM PT RETURN 9FT ADLT (ELECTROSURGICAL) ×2
ELECTRODE REM PT RTRN 9FT ADLT (ELECTROSURGICAL) ×1 IMPLANT
GAUZE SPONGE 4X4 16PLY XRAY LF (GAUZE/BANDAGES/DRESSINGS) ×2 IMPLANT
GLOVE BIO SURGEON STRL SZ 6.5 (GLOVE) ×2 IMPLANT
GLOVE BIO SURGEON STRL SZ7 (GLOVE) ×2 IMPLANT
GLOVE BIOGEL PI IND STRL 7.0 (GLOVE) ×1 IMPLANT
GLOVE BIOGEL PI INDICATOR 7.0 (GLOVE) ×1
GOWN PREVENTION PLUS XLARGE (GOWN DISPOSABLE) IMPLANT
GOWN PREVENTION PLUS XXLARGE (GOWN DISPOSABLE) ×4 IMPLANT
KIT CVR 48X5XPRB PLUP LF (MISCELLANEOUS) IMPLANT
KIT PORT POWER 8FR ISP CVUE (Catheter) IMPLANT
KIT PORT POWER 9.6FR MRI PREA (Catheter) IMPLANT
KIT PORT POWER ISP 8FR (Catheter) IMPLANT
KIT POWER CATH 8FR (Catheter) ×2 IMPLANT
NEEDLE HYPO 25X1 1.5 SAFETY (NEEDLE) ×2 IMPLANT
NS IRRIG 1000ML POUR BTL (IV SOLUTION) ×2 IMPLANT
PACK BASIN DAY SURGERY FS (CUSTOM PROCEDURE TRAY) ×2 IMPLANT
PENCIL BUTTON HOLSTER BLD 10FT (ELECTRODE) ×2 IMPLANT
SLEEVE SCD COMPRESS KNEE MED (MISCELLANEOUS) ×2 IMPLANT
SPONGE GAUZE 2X2 8PLY STRL LF (GAUZE/BANDAGES/DRESSINGS) ×2 IMPLANT
SUT PROLENE 2 0 SH DA (SUTURE) ×2 IMPLANT
SUT VIC AB 2-0 SH 18 (SUTURE) ×2 IMPLANT
SUT VIC AB 3-0 SH 27 (SUTURE) ×1
SUT VIC AB 3-0 SH 27X BRD (SUTURE) ×1 IMPLANT
SUT VICRYL 4-0 PS2 18IN ABS (SUTURE) ×2 IMPLANT
SYR 20CC LL (SYRINGE) ×4 IMPLANT
SYR 5ML LUER SLIP (SYRINGE) ×2 IMPLANT
SYR CONTROL 10ML LL (SYRINGE) ×2 IMPLANT
TOWEL OR 17X24 6PK STRL BLUE (TOWEL DISPOSABLE) ×2 IMPLANT

## 2013-09-16 NOTE — Transfer of Care (Signed)
Immediate Anesthesia Transfer of Care Note  Patient: John Hubbard  Procedure(s) Performed: Procedure(s) with comments: INSERTION PORT-A-CATH (Left) - dressing change on lower abdominal wound  Patient Location: PACU  Anesthesia Type:General  Level of Consciousness: awake  Airway & Oxygen Therapy: Patient Spontanous Breathing and Patient connected to face mask oxygen  Post-op Assessment: Report given to PACU RN and Post -op Vital signs reviewed and stable  Post vital signs: Reviewed and stable  Complications: No apparent anesthesia complications

## 2013-09-16 NOTE — Telephone Encounter (Signed)
Message from pt's wife reporting they have not heard anything about Xeloda Rx. Called [facility], Jasmine reports [facility] has not received a prescription on this pt. Called [facility]. Rx is ready to be picked up. Co-pay is $10. Pt made aware. He stated he will pick up Rx on 12/17 prior to chemo tx.

## 2013-09-16 NOTE — Anesthesia Preprocedure Evaluation (Signed)
Anesthesia Evaluation  Patient identified by MRN, date of birth, ID band Patient awake    Reviewed: Allergy & Precautions, H&P , NPO status , Patient's Chart, lab work & pertinent test results  Airway Mallampati: I TM Distance: >3 FB Neck ROM: Full    Dental  (+) Teeth Intact and Dental Advisory Given   Pulmonary  breath sounds clear to auscultation        Cardiovascular hypertension, Pt. on medications Rhythm:Regular Rate:Normal     Neuro/Psych    GI/Hepatic   Endo/Other  Morbid obesity  Renal/GU      Musculoskeletal   Abdominal   Peds  Hematology   Anesthesia Other Findings   Reproductive/Obstetrics                           Anesthesia Physical Anesthesia Plan  ASA: III  Anesthesia Plan: General   Post-op Pain Management:    Induction: Intravenous  Airway Management Planned: Oral ETT  Additional Equipment:   Intra-op Plan:   Post-operative Plan: Extubation in OR  Informed Consent: I have reviewed the patients History and Physical, chart, labs and discussed the procedure including the risks, benefits and alternatives for the proposed anesthesia with the patient or authorized representative who has indicated his/her understanding and acceptance.   Dental advisory given  Plan Discussed with: CRNA, Anesthesiologist and Surgeon  Anesthesia Plan Comments:         Anesthesia Quick Evaluation

## 2013-09-16 NOTE — H&P (Signed)
John Hubbard is a 52 y.o. M with colon cancer stage 3.  He is here today for port placement.    Past Medical History  Diagnosis Date  . Hypertension   . Anxiety   . Gout    Past Surgical History  Procedure Laterality Date  . Hernia repair  06/19/13    Umbilical Hernia Repair  . Colonoscopy Bilateral 07/24/2013    Procedure: COLONOSCOPY;  Surgeon: Romie Levee, MD;  Location: [facility];  Service: Endoscopy;  Laterality: Bilateral;  . Laparoscopic partial colectomy N/A 08/14/2013    Procedure: LAPAROSCOPIC PARTIAL COLECTOMY wound vac placement;  Surgeon: Romie Levee, MD;  Location: [facility];  Service: General;  Laterality: N/A;     Medication List           acetaminophen 500 MG tablet  Commonly known as:  TYLENOL  Take 500 mg by mouth 2 (two) times daily as needed.     allopurinol 300 MG tablet  Commonly known as:  ZYLOPRIM  Take 300 mg by mouth daily.     capecitabine 500 MG tablet  Commonly known as:  XELODA  Take 4 tablets (= 2000 mg) in AM; then 4 tablets (=2000 mg) in PM = 4000 mg total daily for 14 days.  **Start 09/18/13**     carbamazepine 200 MG tablet  Commonly known as:  TEGRETOL  Take 200 mg by mouth 2 (two) times daily.     colchicine 0.6 MG tablet  Take 0.6 mg by mouth 2 (two) times daily.     oxyCODONE 5 MG immediate release tablet  Commonly known as:  Oxy IR/ROXICODONE  Take 1 tablet (5 mg total) by mouth every 4 (four) hours as needed for severe pain.     prochlorperazine 10 MG tablet  Commonly known as:  COMPAZINE  Take 1 tablet (10 mg total) by mouth every 6 (six) hours as needed for nausea or vomiting.     sodium chloride irrigation 0.9 % irrigation       Allergies  Allergen Reactions  . Penicillins Hives   History reviewed. No pertinent family history.  History   Social History  . Marital Status: Married    Spouse Name: N/A    Number of Children: N/A  . Years of Education: N/A   Occupational History  . Not on file.    Social History Main Topics  . Smoking status: Never Smoker   . Smokeless tobacco: Never Used  . Alcohol Use: Yes     Comment: 1-2 x month  . Drug Use: No  . Sexual Activity: Not on file   Other Topics Concern  . Not on file   Social History Narrative  . No narrative on file   Review of Systems  Constitutional: Negative for fever and chills.  Eyes: Negative for blurred vision.  Respiratory: Negative for cough, sputum production and shortness of breath.   Cardiovascular: Negative for chest pain and leg swelling.  Gastrointestinal: Negative for nausea, vomiting and abdominal pain.  Genitourinary: Negative for dysuria, urgency and frequency.  Musculoskeletal: Negative for myalgias.  Skin: Negative for rash.  Neurological: Negative for headaches.   EXAM Filed Vitals:   09/16/13 1006  BP: 153/92  Pulse: 83  Temp: 98.6 F (37 C)  Resp: 20   Physical Exam  Constitutional: He is oriented to person, place, and time. He appears well-developed and well-nourished. No distress.  HENT:  Head: Normocephalic and atraumatic.  Eyes: Pupils are equal, round, and reactive to light.  Neck: Normal range of motion.  Cardiovascular: Normal rate and regular rhythm.   Pulmonary/Chest: Effort normal and breath sounds normal.  Abdominal: Soft. He exhibits no distension.  Neurological: He is alert and oriented to person, place, and time.  Skin: He is not diaphoretic.  His open abd wound is much smaller and granulating well.    Assessment  Stage 3 Colon cancer  Plan OR for port placement.  Risks and benefits of port placement were discussed with the patient.  These include bleeding, infection and pneumothorax.

## 2013-09-16 NOTE — Anesthesia Postprocedure Evaluation (Signed)
  Anesthesia Post-op Note  Patient: John Hubbard  Procedure(s) Performed: Procedure(s) with comments: INSERTION PORT-A-CATH (Left) - dressing change on lower abdominal wound  Patient Location: PACU  Anesthesia Type:General  Level of Consciousness: awake, alert  and oriented  Airway and Oxygen Therapy: Patient Spontanous Breathing and Patient connected to face mask oxygen  Post-op Pain: none  Post-op Assessment: Post-op Vital signs reviewed  Post-op Vital Signs: Reviewed  Complications: No apparent anesthesia complications

## 2013-09-16 NOTE — Op Note (Signed)
09/16/2013  12:06 PM  PATIENT:  John Hubbard  52 y.o. male  Patient Care Team: Aida Puffer, MD as PCP - General (Family Medicine)  PRE-OPERATIVE DIAGNOSIS:  colon cancer stage 3  POST-OPERATIVE DIAGNOSIS:  colon cancer stage 3  PROCEDURE: INSERTION PORT-A-CATH   Surgeon(s): Romie Levee, MD  ASSISTANT: none   ANESTHESIA:   general  EBL:     DRAINS: none   SPECIMEN:  No Specimen  DISPOSITION OF SPECIMEN:  N/A  COUNTS:  YES  PLAN OF CARE: Discharge to home after PACU  PATIENT DISPOSITION:  PACU - hemodynamically stable.  INDICATION: This is a 52 year old male who recently underwent surgery for resection of a colon mass. His pathology was determined to be T3 N IIb.  Given his stage III colon cancer, he was seen by oncology who has requested Port-A-Cath placement for chemotherapy. The risks and benefits of this procedure were 20 the patient per the OR consent was signed and placed on chart.   OR FINDINGS: Standard port placement  DESCRIPTION: the patient was identified in the preoperative holding area and taken to the OR where they were laid Supine on the operating room table With a shoulder roll underneath his back and his arms tucked at his side.  Gen. anesthesia was induced without difficulty. SCDs were also noted to be in place prior to the initiation of anesthesia.  The patient was then prepped and draped in the usual sterile fashion.   A surgical timeout was performed indicating the correct patient, procedure, positioning and need for preoperative antibiotics.   I began by anesthetizing the skin with lidocaine on the patient's left infraclavicular region.  The port needle was then inserted into the skin and underneath the clavicle. The subclavian vein was accessed on the first try. A guidewire was placed and fluoroscopy confirmed correct placement into the vena cava system.  The introducer was placed over the wire under direct fluoroscopy. The port tubing was then  placed into position. The tubing was then tunneled into a second pocket created using Bovie cautery and blunt dissection. The tubing end was adjusted to sit into the superior vena cava.  I then cut the tubing and placed the port using the connector device. The port was then seated into the subcuticular pocket and sutured into place using 2 2-0 Prolene sutures. The pocket was irrigated with saline. Hemostasis was good.  The dermal layer was closed using interrupted 2-0 Vicryl sutures. The skin of the puncture site and port site were closed using 4-0 Vicryl suture and Dermabond.  The patient remained stable throughout the procedure. He was awakened from anesthesia and sent to the postanesthesia care unit in stable condition. All counts were correct operator and staff. He is awaiting portable chest x-ray in the post anesthesia care unit.

## 2013-09-17 ENCOUNTER — Encounter (HOSPITAL_BASED_OUTPATIENT_CLINIC_OR_DEPARTMENT_OTHER): Payer: Self-pay | Admitting: General Surgery

## 2013-09-18 ENCOUNTER — Telehealth: Payer: Self-pay | Admitting: Oncology

## 2013-09-18 ENCOUNTER — Ambulatory Visit: Payer: Federal, State, Local not specified - PPO | Admitting: Nutrition

## 2013-09-18 ENCOUNTER — Ambulatory Visit (HOSPITAL_BASED_OUTPATIENT_CLINIC_OR_DEPARTMENT_OTHER): Payer: Federal, State, Local not specified - PPO | Admitting: Nurse Practitioner

## 2013-09-18 ENCOUNTER — Ambulatory Visit (HOSPITAL_BASED_OUTPATIENT_CLINIC_OR_DEPARTMENT_OTHER): Payer: Federal, State, Local not specified - PPO

## 2013-09-18 ENCOUNTER — Telehealth: Payer: Self-pay | Admitting: *Deleted

## 2013-09-18 VITALS — BP 135/83 | HR 82 | Temp 97.5°F | Resp 19 | Ht 70.0 in | Wt 300.1 lb

## 2013-09-18 DIAGNOSIS — Z5111 Encounter for antineoplastic chemotherapy: Secondary | ICD-10-CM

## 2013-09-18 DIAGNOSIS — I10 Essential (primary) hypertension: Secondary | ICD-10-CM

## 2013-09-18 DIAGNOSIS — C189 Malignant neoplasm of colon, unspecified: Secondary | ICD-10-CM

## 2013-09-18 DIAGNOSIS — F411 Generalized anxiety disorder: Secondary | ICD-10-CM

## 2013-09-18 DIAGNOSIS — C187 Malignant neoplasm of sigmoid colon: Secondary | ICD-10-CM

## 2013-09-18 MED ORDER — DEXAMETHASONE SODIUM PHOSPHATE 10 MG/ML IJ SOLN
10.0000 mg | Freq: Once | INTRAMUSCULAR | Status: AC
  Administered 2013-09-18: 10 mg via INTRAVENOUS

## 2013-09-18 MED ORDER — DEXTROSE 5 % IV SOLN
Freq: Once | INTRAVENOUS | Status: AC
  Administered 2013-09-18: 11:00:00 via INTRAVENOUS

## 2013-09-18 MED ORDER — DEXTROSE 5 % IV SOLN
130.0000 mg/m2 | Freq: Once | INTRAVENOUS | Status: AC
  Administered 2013-09-18: 330 mg via INTRAVENOUS
  Filled 2013-09-18: qty 66

## 2013-09-18 MED ORDER — SODIUM CHLORIDE 0.9 % IJ SOLN
10.0000 mL | INTRAMUSCULAR | Status: DC | PRN
  Administered 2013-09-18: 10 mL
  Filled 2013-09-18: qty 10

## 2013-09-18 MED ORDER — HEPARIN SOD (PORK) LOCK FLUSH 100 UNIT/ML IV SOLN
500.0000 [IU] | Freq: Once | INTRAVENOUS | Status: AC | PRN
  Administered 2013-09-18: 500 [IU]
  Filled 2013-09-18: qty 5

## 2013-09-18 MED ORDER — ONDANSETRON 8 MG/50ML IVPB (CHCC)
8.0000 mg | Freq: Once | INTRAVENOUS | Status: AC
  Administered 2013-09-18: 8 mg via INTRAVENOUS

## 2013-09-18 MED ORDER — DEXAMETHASONE SODIUM PHOSPHATE 10 MG/ML IJ SOLN
INTRAMUSCULAR | Status: AC
  Filled 2013-09-18: qty 1

## 2013-09-18 MED ORDER — ONDANSETRON 8 MG/NS 50 ML IVPB
INTRAVENOUS | Status: AC
  Filled 2013-09-18: qty 8

## 2013-09-18 NOTE — Telephone Encounter (Signed)
Gave pt's wife appt for lab, and MD for january 2015,called pt on cell left message for chemo appt for January 2015

## 2013-09-18 NOTE — Progress Notes (Signed)
Followup completed with patient and wife in chemotherapy for patient with Stage III colon cancer.  Patient reports abdominal wound is smaller, and M.D. states wound is granulating and healing well.  Patient still has an open wound and continues to clean and pack it on a daily basis.  Weight has increased to 300.1 pounds on December 17 from 290.4 pounds on December 2.  Patient has increased oral intake and is conscientious about protein foods.  Patient has questions about multivitamin sprays/"super 10 sprays" and has asked me to look into this for him.  Patient states he is not going to take them now.  Patient reports taking B vitamins and feeling like it has improved neuropathy.  Nutrition diagnosis: Food and nutrition related knowledge deficit continues.  Intervention: Patient educated to consume healthy, plant-based diet to minimize weight gain throughout treatment.  I've encouraged him to continue lean sources of protein.  I reviewed easy foods to have available for days after chemotherapy and while his wife is at work.  I will research patients questions on multivitamin sprays and follow up at next visit.    Monitoring, evaluation, goals: Patient has tolerated adequate calories and protein for increased healing.  He has, however, gained weight.  He will work to maintain lean body mass and avoid weight gain.  Next visit: Wednesday, January 7, during chemotherapy.

## 2013-09-18 NOTE — Telephone Encounter (Signed)
Per staff message and POF I have scheduled appts.  JMW  

## 2013-09-18 NOTE — Patient Instructions (Signed)
[facility] Discharge Instructions for Patients Receiving Chemotherapy  Today you received the following chemotherapy agents: Oxaliplatin  To help prevent nausea and vomiting after your treatment, we encourage you to take your nausea medication as prescribed.    If you develop nausea and vomiting that is not controlled by your nausea medication, call the clinic.   BELOW ARE SYMPTOMS THAT SHOULD BE REPORTED IMMEDIATELY:  *FEVER GREATER THAN 100.5 F  *CHILLS WITH OR WITHOUT FEVER  NAUSEA AND VOMITING THAT IS NOT CONTROLLED WITH YOUR NAUSEA MEDICATION  *UNUSUAL SHORTNESS OF BREATH  *UNUSUAL BRUISING OR BLEEDING  TENDERNESS IN MOUTH AND THROAT WITH OR WITHOUT PRESENCE OF ULCERS  *URINARY PROBLEMS  *BOWEL PROBLEMS  UNUSUAL RASH Items with * indicate a potential emergency and should be followed up as soon as possible.  Feel free to call the clinic you have any questions or concerns. The clinic phone number is [phone].    

## 2013-09-18 NOTE — Progress Notes (Signed)
OFFICE PROGRESS NOTE  Interval history:  [name] returns for followup of recently diagnosed colon cancer. He underwent Port-A-Cath placement on 09/16/2013. He notes tenderness at the lower anterior neck and lower chest over the past several days. No shortness of breath, cough or fever. He denies nausea/vomiting. No mouth sores. No diarrhea. No skin rash. He reports pre-existing mild numbness in the right fingertips. He reports a good appetite. Lower abdominal wound is healing per wife report. She continues to pack the wound.   Objective: Blood pressure 135/83, pulse 82, temperature 97.5 F (36.4 C), temperature source Oral, resp. rate 19, height 5\' 10"  (1.778 m), weight 300 lb 1.6 oz (136.124 kg).  No thrush or ulcerations. Lungs are clear. No wheezes or rales. Regular cardiac rhythm. Port-A-Cath site is without erythema. Abdomen is soft and nontender. No hepatomegaly. Low transverse incision with superficial opening at the left side with packing in place. The packing was partially removed. Healthy granulation tissue noted. Bleeding noted with removal of the packing. No leg edema. Calves nontender.  Lab Results: Lab Results  Component Value Date   WBC 6.3 09/10/2013   HGB 14.1 09/16/2013   HCT 40.6 09/10/2013   MCV 82.3 09/10/2013   PLT 228 09/10/2013    Chemistry:    Chemistry      Component Value Date/Time   NA 142 09/10/2013 1135   NA 135 08/17/2013 0350   K 4.2 09/10/2013 1135   K 3.7 08/17/2013 0350   CL 96 08/17/2013 0350   CO2 29 09/10/2013 1135   CO2 33* 08/17/2013 0350   BUN 13.6 09/10/2013 1135   BUN 9 08/17/2013 0350   CREATININE 0.8 09/10/2013 1135   CREATININE 0.80 08/17/2013 0350      Component Value Date/Time   CALCIUM 10.0 09/10/2013 1135   CALCIUM 9.2 08/17/2013 0350   ALKPHOS 157* 09/10/2013 1135   AST 19 09/10/2013 1135   ALT 35 09/10/2013 1135   BILITOT 0.28 09/10/2013 1135       Studies/Results: Dg Chest Port 1 View  09/16/2013   CLINICAL DATA:  Colon  cancer.  Port-A-Cath placement.  EXAM: PORTABLE CHEST - 1 VIEW  COMPARISON:  Chest CT dated 07/29/2013  FINDINGS: Power port tip appears in good position in the superior vena cava in the AP projection. Prominent superior mediastinum is due to prominent mediastinal fat as demonstrated on the prior CT scan. No pneumothorax. Minimal atelectasis at the left lung base. No acute osseous abnormality.  IMPRESSION: Port-A-Cath in good position. No pneumothorax. Slight atelectasis at the left base.   Electronically Signed   By: Geanie Cooley M.D.   On: 09/16/2013 12:40   Dg Fluoro Guide Cv Line-no Report  09/16/2013   CLINICAL DATA: port placement   FLOURO GUIDE CV LINE  Fluoroscopy was utilized by the requesting physician.  No radiographic  interpretation.     Medications: I have reviewed the patient's current medications.  Assessment/Plan:  1. 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 The tumor returned microsatellite stable with no loss of mismatch repair protein expression.   Cycle 1 adjuvant CAPOX beginning 09/18/2013. 2. Low anterior abdominal wound with packing in place. The wound is healing. 3. Port-A-Cath placement 09/16/2013. 4. Gout. 5. History of anxiety maintained on Tegretol. 6. Hypertension.  Disposition-he appears stable. Plan to proceed with cycle 1 adjuvant CAPOX today as scheduled. We again reviewed potential toxicities associated with this regimen. His questions were answered.  We will see him in followup in 3 weeks on 10/09/2012 prior to cycle 2 CAPOX. He will contact the office in the interim with any problems.  Plan reviewed with Dr. Truett Perna.  Lonna Cobb ANP/GNP-BC

## 2013-09-19 ENCOUNTER — Telehealth: Payer: Self-pay | Admitting: *Deleted

## 2013-09-19 NOTE — Telephone Encounter (Signed)
Called pt at home for post chemo follow up call.  Pt stated he was doing fine.  Denied nausea/vomiting, bowel and bladder function fine, good appetite and drinking fine.  Pt has one episode of cold sensitivity but resolved quickly.  Reinforced need for increased po fluids intake as tolerated.  Pt voiced understanding and understood to call office with any new problems.

## 2013-10-03 DIAGNOSIS — Z9289 Personal history of other medical treatment: Secondary | ICD-10-CM

## 2013-10-03 HISTORY — DX: Personal history of other medical treatment: Z92.89

## 2013-10-04 ENCOUNTER — Other Ambulatory Visit: Payer: Self-pay | Admitting: Oncology

## 2013-10-09 ENCOUNTER — Ambulatory Visit: Payer: Federal, State, Local not specified - PPO | Admitting: Nutrition

## 2013-10-09 ENCOUNTER — Ambulatory Visit (HOSPITAL_BASED_OUTPATIENT_CLINIC_OR_DEPARTMENT_OTHER): Payer: Federal, State, Local not specified - PPO | Admitting: Oncology

## 2013-10-09 ENCOUNTER — Other Ambulatory Visit (HOSPITAL_BASED_OUTPATIENT_CLINIC_OR_DEPARTMENT_OTHER): Payer: Federal, State, Local not specified - PPO

## 2013-10-09 ENCOUNTER — Telehealth: Payer: Self-pay | Admitting: Oncology

## 2013-10-09 ENCOUNTER — Other Ambulatory Visit: Payer: Self-pay | Admitting: *Deleted

## 2013-10-09 ENCOUNTER — Ambulatory Visit (INDEPENDENT_AMBULATORY_CARE_PROVIDER_SITE_OTHER): Payer: Federal, State, Local not specified - PPO | Admitting: General Surgery

## 2013-10-09 ENCOUNTER — Telehealth: Payer: Self-pay | Admitting: *Deleted

## 2013-10-09 ENCOUNTER — Ambulatory Visit (HOSPITAL_BASED_OUTPATIENT_CLINIC_OR_DEPARTMENT_OTHER): Payer: Federal, State, Local not specified - PPO

## 2013-10-09 ENCOUNTER — Encounter (INDEPENDENT_AMBULATORY_CARE_PROVIDER_SITE_OTHER): Payer: Self-pay | Admitting: General Surgery

## 2013-10-09 VITALS — BP 149/79 | HR 82 | Temp 97.0°F | Resp 18 | Ht 70.0 in | Wt 302.0 lb

## 2013-10-09 VITALS — BP 144/86 | HR 80 | Temp 97.0°F | Resp 18 | Ht 70.0 in | Wt 301.6 lb

## 2013-10-09 DIAGNOSIS — M109 Gout, unspecified: Secondary | ICD-10-CM

## 2013-10-09 DIAGNOSIS — Z5111 Encounter for antineoplastic chemotherapy: Secondary | ICD-10-CM

## 2013-10-09 DIAGNOSIS — Z9889 Other specified postprocedural states: Secondary | ICD-10-CM

## 2013-10-09 DIAGNOSIS — C187 Malignant neoplasm of sigmoid colon: Secondary | ICD-10-CM

## 2013-10-09 DIAGNOSIS — F411 Generalized anxiety disorder: Secondary | ICD-10-CM

## 2013-10-09 DIAGNOSIS — C189 Malignant neoplasm of colon, unspecified: Secondary | ICD-10-CM

## 2013-10-09 DIAGNOSIS — I10 Essential (primary) hypertension: Secondary | ICD-10-CM

## 2013-10-09 LAB — CBC WITH DIFFERENTIAL/PLATELET
BASO%: 0.9 % (ref 0.0–2.0)
Basophils Absolute: 0.1 10*3/uL (ref 0.0–0.1)
EOS ABS: 0.3 10*3/uL (ref 0.0–0.5)
EOS%: 3.8 % (ref 0.0–7.0)
HEMATOCRIT: 40.7 % (ref 38.4–49.9)
HEMOGLOBIN: 13.7 g/dL (ref 13.0–17.1)
LYMPH%: 35.5 % (ref 14.0–49.0)
MCH: 28.1 pg (ref 27.2–33.4)
MCHC: 33.8 g/dL (ref 32.0–36.0)
MCV: 83.1 fL (ref 79.3–98.0)
MONO#: 0.6 10*3/uL (ref 0.1–0.9)
MONO%: 7.4 % (ref 0.0–14.0)
NEUT%: 52.4 % (ref 39.0–75.0)
NEUTROS ABS: 4.1 10*3/uL (ref 1.5–6.5)
Platelets: 227 10*3/uL (ref 140–400)
RBC: 4.89 10*6/uL (ref 4.20–5.82)
RDW: 14.4 % (ref 11.0–14.6)
WBC: 7.9 10*3/uL (ref 4.0–10.3)
lymph#: 2.8 10*3/uL (ref 0.9–3.3)

## 2013-10-09 LAB — COMPREHENSIVE METABOLIC PANEL (CC13)
ALT: 28 U/L (ref 0–55)
ANION GAP: 11 meq/L (ref 3–11)
AST: 18 U/L (ref 5–34)
Albumin: 3.9 g/dL (ref 3.5–5.0)
Alkaline Phosphatase: 141 U/L (ref 40–150)
BILIRUBIN TOTAL: 0.21 mg/dL (ref 0.20–1.20)
BUN: 18.1 mg/dL (ref 7.0–26.0)
CALCIUM: 9.6 mg/dL (ref 8.4–10.4)
CHLORIDE: 101 meq/L (ref 98–109)
CO2: 28 meq/L (ref 22–29)
CREATININE: 0.8 mg/dL (ref 0.7–1.3)
GLUCOSE: 152 mg/dL — AB (ref 70–140)
Potassium: 4.2 mEq/L (ref 3.5–5.1)
SODIUM: 139 meq/L (ref 136–145)
TOTAL PROTEIN: 7.3 g/dL (ref 6.4–8.3)

## 2013-10-09 MED ORDER — OXALIPLATIN CHEMO INJECTION 100 MG/20ML
130.0000 mg/m2 | Freq: Once | INTRAVENOUS | Status: AC
  Administered 2013-10-09: 330 mg via INTRAVENOUS
  Filled 2013-10-09: qty 66

## 2013-10-09 MED ORDER — DEXAMETHASONE SODIUM PHOSPHATE 10 MG/ML IJ SOLN
INTRAMUSCULAR | Status: AC
  Filled 2013-10-09: qty 1

## 2013-10-09 MED ORDER — CAPECITABINE 500 MG PO TABS: 2000.0000 mg | ORAL_TABLET | Freq: Two times a day (BID) | ORAL | Status: DC

## 2013-10-09 MED ORDER — SODIUM CHLORIDE 0.9 % IJ SOLN
10.0000 mL | INTRAMUSCULAR | Status: DC | PRN
  Administered 2013-10-09: 10 mL
  Filled 2013-10-09: qty 10

## 2013-10-09 MED ORDER — PROCHLORPERAZINE MALEATE 10 MG PO TABS: 10.0000 mg | ORAL_TABLET | Freq: Four times a day (QID) | ORAL | Status: DC | PRN

## 2013-10-09 MED ORDER — HEPARIN SOD (PORK) LOCK FLUSH 100 UNIT/ML IV SOLN
500.0000 [IU] | Freq: Once | INTRAVENOUS | Status: AC | PRN
  Administered 2013-10-09: 500 [IU]
  Filled 2013-10-09: qty 5

## 2013-10-09 MED ORDER — ONDANSETRON 8 MG/NS 50 ML IVPB
INTRAVENOUS | Status: AC
  Filled 2013-10-09: qty 8

## 2013-10-09 MED ORDER — DEXTROSE 5 % IV SOLN
Freq: Once | INTRAVENOUS | Status: AC
  Administered 2013-10-09: 13:00:00 via INTRAVENOUS

## 2013-10-09 MED ORDER — ONDANSETRON 8 MG/50ML IVPB (CHCC)
8.0000 mg | Freq: Once | INTRAVENOUS | Status: AC
  Administered 2013-10-09: 8 mg via INTRAVENOUS

## 2013-10-09 MED ORDER — DEXAMETHASONE SODIUM PHOSPHATE 10 MG/ML IJ SOLN
10.0000 mg | Freq: Once | INTRAMUSCULAR | Status: AC
  Administered 2013-10-09: 10 mg via INTRAVENOUS

## 2013-10-09 NOTE — Progress Notes (Signed)
John Hubbard is a 53 y.o. male who is status post a port placement for stage 3 colon cancer.  He is doing well after port placement. He has received one cycle of chemotherapy. He seems to do well with this. He is having regular bowel movements and minimal constipation. He denies any bleeding with bowel movements.  They have stopped packing the wound.  Objective: Filed Vitals:   10/09/13 1008  BP: 144/86  Pulse: 80  Temp: 97 F (36.1 C)  Resp: 18    General appearance: alert and cooperative GI: normal findings: soft, non-tender  Incision: healing well   Assessment: s/p  Patient Active Problem List   Diagnosis Date Noted  . Colon cancer 07/31/2013    Plan: Patient is doing well after surgery. He will continue his chemotherapy for 8 cycles. I will see him back in approximately 3 months or sooner if he has issues.    Rosario Adie, [facility], [place]   10/09/2013 10:32 AM

## 2013-10-09 NOTE — Telephone Encounter (Signed)
Per staff message and POF I have scheduled appts.  JMW  

## 2013-10-09 NOTE — Progress Notes (Signed)
Met with patient to assess for needs.  He stated the first cycle of chemo went very well.  No questions or problems with the Xeloda.  Patient understands to be very careful of the expected cold weather in the next few days.  Patient and wife denied any barriers to care at present time.  He is seeing the dietician today.  Will continue to follow as needed.

## 2013-10-09 NOTE — Progress Notes (Signed)
   [facility]    OFFICE PROGRESS NOTE   INTERVAL HISTORY:   He completed a first cycle of CAPOX beginning 09/18/2013. No nausea/vomiting, mouth sores, or diarrhea. He had mild cold sensitivity following chemotherapy. No neuropathy symptoms at present. The abdominal wound has healed. No hand or foot erythema. He reports a flare of gout in the left knee that resolved after treatment with colchicine.  Objective:  Vital signs in last 24 hours:  Blood pressure 149/79, pulse 82, temperature 97 F (36.1 C), temperature source Oral, resp. rate 18, height 5' 10" (1.778 m), weight 302 lb (136.986 kg).    HEENT: No thrush or ulcers Resp: Lungs clear bilaterally Cardio: Regular rate and rhythm GI: No hepatomegaly, nontender, healed low transverse incision Vascular: No leg edema  Skin: Faint erythema over the abdominal wall   Portacath/PICC-mild erythema surrounding the port incision site with an eschar, no tenderness, no erythema over the port itself  Lab Results:  Lab Results  Component Value Date   WBC 7.9 10/09/2013   HGB 13.7 10/09/2013   HCT 40.7 10/09/2013   MCV 83.1 10/09/2013   PLT 227 10/09/2013   NEUTROABS 4.1 10/09/2013      Medications: I have reviewed the patient's current medications.  Assessment/Plan: 1. 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 The tumor returned microsatellite stable with no loss of mismatch repair protein expression.  Cycle 1 adjuvant CAPOX beginning 09/18/2013. 2. Port-A-Cath placement 09/16/2013. 3. Gout. 4. History of anxiety maintained on Tegretol. 5. Hypertension.   Disposition:  He tolerated the first cycle of adjuvant CAPOX without significant acute toxicity. The plan is to proceed with cycle 2 today. He will return for an office visit and chemotherapy in 3 weeks. Mr. Kuang will contact us in the interim for new symptoms.   Betsy Coder,  MD  10/09/2013  12:42 PM

## 2013-10-09 NOTE — Patient Instructions (Signed)
Return to the office in 3 months 

## 2013-10-09 NOTE — Telephone Encounter (Signed)
Message from [place] in managed care dept, pt is no longer able to get his Xeloda at [facility]. Rx, insurance card and facesheet faxed to [facility].

## 2013-10-09 NOTE — Telephone Encounter (Signed)
Gave pt appt for lab,md and chemo for january and February 2015 °

## 2013-10-09 NOTE — Progress Notes (Signed)
Nutrition followup completed with patient and wife, during chemotherapy.  Patient feels well.  Reports his wound has healed.  He states he got a good report from his physician.  He tolerated his last chemotherapy without problem.  Weight increased slightly to 302 pounds documented on January 7.  Patient reports he has been very conscientious about consuming increased protein.  He is trying to eat healthy foods.  Patient had questions about food safety.  Nutrition diagnosis: Food and nutrition related knowledge deficit continues but has improved.  Intervention: Patient educated to consume healthy, plant-based diet to minimize further weight gain.  Patient educated to continue lean sources of protein.  Patient also educated on strategies for incorporating lean proteins, more vegetables, and whole fruits.  I reviewed food safety with patient.  Questions were answered.  Teach back method used.  Monitoring, evaluation, goals: Patient has tolerated adequate calories and protein for healing.  Weight continues to increase therefore, patient will work to monitor portions to avoid further weight gain.  Next visit: Wednesday, January 28, during chemotherapy.

## 2013-10-09 NOTE — Patient Instructions (Signed)
[facility] Discharge Instructions for Patients Receiving Chemotherapy  Today you received the following chemotherapy agents Oxailplatin To help prevent nausea and vomiting after your treatment, we encourage you to take your nausea medication as prescribed. If you develop nausea and vomiting that is not controlled by your nausea medication, call the clinic.   BELOW ARE SYMPTOMS THAT SHOULD BE REPORTED IMMEDIATELY:  *FEVER GREATER THAN 100.5 F  *CHILLS WITH OR WITHOUT FEVER  NAUSEA AND VOMITING THAT IS NOT CONTROLLED WITH YOUR NAUSEA MEDICATION  *UNUSUAL SHORTNESS OF BREATH  *UNUSUAL BRUISING OR BLEEDING  TENDERNESS IN MOUTH AND THROAT WITH OR WITHOUT PRESENCE OF ULCERS  *URINARY PROBLEMS  *BOWEL PROBLEMS  UNUSUAL RASH Items with * indicate a potential emergency and should be followed up as soon as possible.  Feel free to call the clinic you have any questions or concerns. The clinic phone number is [phone].

## 2013-10-10 ENCOUNTER — Telehealth: Payer: Self-pay | Admitting: *Deleted

## 2013-10-10 ENCOUNTER — Encounter: Payer: Self-pay | Admitting: *Deleted

## 2013-10-10 NOTE — Progress Notes (Signed)
RECEIVED A FAX FROM [facility] CONCERNING A PRIOR AUTHORIZATION FOR CAPECITABINE.THIS REQUEST WAS PLACED IN THE MANAGED CARE BIN. 

## 2013-10-10 NOTE — Telephone Encounter (Signed)
Message from [place]. Pt's Xeloda Rx has been transferred to [facility]. All future refills to [facility].

## 2013-10-11 ENCOUNTER — Other Ambulatory Visit: Payer: Self-pay | Admitting: *Deleted

## 2013-10-11 ENCOUNTER — Telehealth: Payer: Self-pay | Admitting: *Deleted

## 2013-10-11 MED ORDER — CAPECITABINE 500 MG PO TABS: 2000.0000 mg | ORAL_TABLET | Freq: Two times a day (BID) | ORAL | Status: DC

## 2013-10-11 NOTE — Telephone Encounter (Signed)
Received fax from [facility]. They have received Xeloda referral, will contact office within 48 hours status of Rx.

## 2013-10-11 NOTE — Telephone Encounter (Signed)
Received fax from [facility] requesting clarification of cycle on Xeloda. Pt takes for 14 days on then 7 days off.

## 2013-10-27 ENCOUNTER — Other Ambulatory Visit: Payer: Self-pay | Admitting: Oncology

## 2013-10-30 ENCOUNTER — Ambulatory Visit: Payer: Federal, State, Local not specified - PPO | Admitting: Nutrition

## 2013-10-30 ENCOUNTER — Telehealth: Payer: Self-pay | Admitting: Oncology

## 2013-10-30 ENCOUNTER — Other Ambulatory Visit (HOSPITAL_BASED_OUTPATIENT_CLINIC_OR_DEPARTMENT_OTHER): Payer: Federal, State, Local not specified - PPO

## 2013-10-30 ENCOUNTER — Ambulatory Visit (HOSPITAL_BASED_OUTPATIENT_CLINIC_OR_DEPARTMENT_OTHER): Payer: Federal, State, Local not specified - PPO

## 2013-10-30 ENCOUNTER — Encounter: Payer: Self-pay | Admitting: *Deleted

## 2013-10-30 ENCOUNTER — Ambulatory Visit (HOSPITAL_BASED_OUTPATIENT_CLINIC_OR_DEPARTMENT_OTHER): Payer: Federal, State, Local not specified - PPO | Admitting: Oncology

## 2013-10-30 VITALS — BP 151/89 | HR 88 | Temp 97.5°F | Resp 18 | Ht 70.0 in | Wt 300.9 lb

## 2013-10-30 DIAGNOSIS — C189 Malignant neoplasm of colon, unspecified: Secondary | ICD-10-CM

## 2013-10-30 DIAGNOSIS — M109 Gout, unspecified: Secondary | ICD-10-CM

## 2013-10-30 DIAGNOSIS — I10 Essential (primary) hypertension: Secondary | ICD-10-CM

## 2013-10-30 DIAGNOSIS — C187 Malignant neoplasm of sigmoid colon: Secondary | ICD-10-CM

## 2013-10-30 DIAGNOSIS — G608 Other hereditary and idiopathic neuropathies: Secondary | ICD-10-CM

## 2013-10-30 DIAGNOSIS — Z5111 Encounter for antineoplastic chemotherapy: Secondary | ICD-10-CM

## 2013-10-30 LAB — COMPREHENSIVE METABOLIC PANEL (CC13)
ALT: 24 U/L (ref 0–55)
ANION GAP: 12 meq/L — AB (ref 3–11)
AST: 18 U/L (ref 5–34)
Albumin: 4 g/dL (ref 3.5–5.0)
Alkaline Phosphatase: 125 U/L (ref 40–150)
BILIRUBIN TOTAL: 0.33 mg/dL (ref 0.20–1.20)
BUN: 19.2 mg/dL (ref 7.0–26.0)
CALCIUM: 9.7 mg/dL (ref 8.4–10.4)
CHLORIDE: 103 meq/L (ref 98–109)
CO2: 26 meq/L (ref 22–29)
CREATININE: 0.8 mg/dL (ref 0.7–1.3)
Glucose: 143 mg/dl — ABNORMAL HIGH (ref 70–140)
Potassium: 4 mEq/L (ref 3.5–5.1)
SODIUM: 141 meq/L (ref 136–145)
TOTAL PROTEIN: 7.3 g/dL (ref 6.4–8.3)

## 2013-10-30 LAB — CBC WITH DIFFERENTIAL/PLATELET
BASO%: 0.9 % (ref 0.0–2.0)
Basophils Absolute: 0.1 10*3/uL (ref 0.0–0.1)
EOS ABS: 0.3 10*3/uL (ref 0.0–0.5)
EOS%: 4.3 % (ref 0.0–7.0)
HEMATOCRIT: 42.5 % (ref 38.4–49.9)
HGB: 14.5 g/dL (ref 13.0–17.1)
LYMPH%: 38.5 % (ref 14.0–49.0)
MCH: 28.9 pg (ref 27.2–33.4)
MCHC: 34.2 g/dL (ref 32.0–36.0)
MCV: 84.5 fL (ref 79.3–98.0)
MONO#: 0.6 10*3/uL (ref 0.1–0.9)
MONO%: 7.7 % (ref 0.0–14.0)
NEUT%: 48.6 % (ref 39.0–75.0)
NEUTROS ABS: 3.5 10*3/uL (ref 1.5–6.5)
PLATELETS: 239 10*3/uL (ref 140–400)
RBC: 5.03 10*6/uL (ref 4.20–5.82)
RDW: 16.8 % — ABNORMAL HIGH (ref 11.0–14.6)
WBC: 7.3 10*3/uL (ref 4.0–10.3)
lymph#: 2.8 10*3/uL (ref 0.9–3.3)

## 2013-10-30 MED ORDER — DEXAMETHASONE SODIUM PHOSPHATE 10 MG/ML IJ SOLN
INTRAMUSCULAR | Status: AC
  Filled 2013-10-30: qty 1

## 2013-10-30 MED ORDER — OXALIPLATIN CHEMO INJECTION 100 MG/20ML
130.0000 mg/m2 | Freq: Once | INTRAVENOUS | Status: AC
  Administered 2013-10-30: 330 mg via INTRAVENOUS
  Filled 2013-10-30: qty 66

## 2013-10-30 MED ORDER — PALONOSETRON HCL INJECTION 0.25 MG/5ML
0.2500 mg | Freq: Once | INTRAVENOUS | Status: AC
  Administered 2013-10-30: 0.25 mg via INTRAVENOUS

## 2013-10-30 MED ORDER — DEXTROSE 5 % IV SOLN
Freq: Once | INTRAVENOUS | Status: AC
  Administered 2013-10-30: 11:00:00 via INTRAVENOUS

## 2013-10-30 MED ORDER — HEPARIN SOD (PORK) LOCK FLUSH 100 UNIT/ML IV SOLN
500.0000 [IU] | Freq: Once | INTRAVENOUS | Status: AC | PRN
  Administered 2013-10-30: 500 [IU]
  Filled 2013-10-30: qty 5

## 2013-10-30 MED ORDER — DEXAMETHASONE SODIUM PHOSPHATE 10 MG/ML IJ SOLN
10.0000 mg | Freq: Once | INTRAMUSCULAR | Status: AC
  Administered 2013-10-30: 10 mg via INTRAVENOUS

## 2013-10-30 MED ORDER — PALONOSETRON HCL INJECTION 0.25 MG/5ML
INTRAVENOUS | Status: AC
  Filled 2013-10-30: qty 5

## 2013-10-30 MED ORDER — SODIUM CHLORIDE 0.9 % IJ SOLN
10.0000 mL | INTRAMUSCULAR | Status: DC | PRN
  Administered 2013-10-30: 10 mL
  Filled 2013-10-30: qty 10

## 2013-10-30 NOTE — Progress Notes (Signed)
Chaplain made initial visit. Pt stated that he was here for maintenance chemo and had already been through surgery for his colon cancer. He said he was doing well for the most part, but that he was looking for work, which was difficult due to the fatigue from his treatment. 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 Pt expressed thanks for visit.

## 2013-10-30 NOTE — Progress Notes (Signed)
Met with patient to assess for needs.  He stated he had nausea after last treatment, but overall did well.  He stated that his Xeloda prescription would need to be filled with a different pharmacy next time or it would be denied.  He said he would contact this office if he runs into difficulty.  He had a few questions related to diet.  The dietician is seeing him today in infusion.  No other needs or barriers to care.  Will continue to follow.

## 2013-10-30 NOTE — Telephone Encounter (Signed)
gv and printed appt sched and avs forpt for Feb adn March....sed added tx.    °

## 2013-10-30 NOTE — Patient Instructions (Addendum)
[facility] Discharge Instructions for Patients Receiving Chemotherapy  Today you received the following chemotherapy agents Oxaliplatin.  To help prevent nausea and vomiting after your treatment, we encourage you to take your nausea medication as prescribed.   If you develop nausea and vomiting that is not controlled by your nausea medication, call the clinic.   BELOW ARE SYMPTOMS THAT SHOULD BE REPORTED IMMEDIATELY:  *FEVER GREATER THAN 100.5 F  *CHILLS WITH OR WITHOUT FEVER  NAUSEA AND VOMITING THAT IS NOT CONTROLLED WITH YOUR NAUSEA MEDICATION  *UNUSUAL SHORTNESS OF BREATH  *UNUSUAL BRUISING OR BLEEDING  TENDERNESS IN MOUTH AND THROAT WITH OR WITHOUT PRESENCE OF ULCERS  *URINARY PROBLEMS  *BOWEL PROBLEMS  UNUSUAL RASH Items with * indicate a potential emergency and should be followed up as soon as possible.  Feel free to call the clinic should you have any questions or concerns. The clinic phone number is [phone].  It was my pleasure to take care of you today!  Leeanne Rio, RN

## 2013-10-30 NOTE — Progress Notes (Signed)
Followup completed with patient in chemotherapy room.  Patient is being treated for stage III colon cancer.  Weight is relatively stable and was documented at 300.9 pounds January 28.  Patient is trying to eat a healthy diet.  He is very conscientious about consuming protein containing foods.  He is trying to increase his physical activity.  Nutrition diagnosis: Food and nutrition related knowledge deficit improved.  Intervention: Patient encouraged to continue to make healthy food choices with adequate protein.  Monitoring, evaluation, goals: Patient is tolerating a healthier plant-based diet with adequate protein for weight stabilization.   Next visit: Followup scheduled March 11, during chemotherapy.  Patient has my contact information for questions before this.

## 2013-10-30 NOTE — Progress Notes (Signed)
   [place]    OFFICE PROGRESS NOTE   INTERVAL HISTORY:   [name] returns for scheduled followup of colon cancer. He completed cycle 2 of CAPOX beginning on 10/09/2013. He had nausea for 3-4 days following chemotherapy. No emesis. He reports tingling in the fingers after chemotherapy. Cold sensitivity lasted 2-3 days. He continues to have tingling in the right first through third fingers. None on the left side. No other neuropathy symptoms. No mouth sores, diarrhea, or hand/foot pain. He had a mild episode of "gout "at the right knee and right great toe following this cycle of chemotherapy. He completed only one week of capecitabine with cycle 2 secondary to a mixup with the capecitabine delivery.  Objective:  Vital signs in last 24 hours:  Blood pressure 151/89, pulse 88, temperature 97.5 F (36.4 C), temperature source Oral, resp. rate 18, height $RemoveBe'5\' 10"'VXbpULqAC$  (1.778 m), weight 300 lb 14.4 oz (136.487 kg).    HEENT: No thrush or ulcers Resp: Lungs clear bilaterally Cardio: Regular rate and rhythm GI: No hepatomegaly, nontender Vascular: No leg edema Neuro: The vibratory sense is intact at the fingertips bilaterally  Skin: Hands and feet without erythema or skin breakdown   Portacath/PICC-without erythema  Lab Results:  Lab Results  Component Value Date   WBC 7.3 10/30/2013   HGB 14.5 10/30/2013   HCT 42.5 10/30/2013   MCV 84.5 10/30/2013   PLT 239 10/30/2013   NEUTROABS 3.5 10/30/2013      Medications: I have reviewed the patient's current medications.  Assessment/Plan: 1. 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 The tumor returned microsatellite stable with no loss of mismatch repair protein expression.  Cycle 1 adjuvant CAPOX beginning 09/18/2013. 2. Port-A-Cath placement 09/16/2013. 3. Gout. 4. History of anxiety maintained on  Tegretol. 5. Hypertension.   Disposition:  He has completed 2 cycles of CAPOX. Mr. Sheeley has tolerated the chemotherapy well. The plan is to proceed with cycle 3 today. I will add Aloxi to the anti-emetic regimen. He will return for an office visit and chemotherapy in 3 weeks.   Betsy Coder, MD  10/30/2013  10:33 AM

## 2013-10-31 ENCOUNTER — Other Ambulatory Visit: Payer: Self-pay | Admitting: *Deleted

## 2013-10-31 ENCOUNTER — Encounter: Payer: Self-pay | Admitting: *Deleted

## 2013-10-31 ENCOUNTER — Telehealth: Payer: Self-pay | Admitting: *Deleted

## 2013-10-31 DIAGNOSIS — C189 Malignant neoplasm of colon, unspecified: Secondary | ICD-10-CM

## 2013-10-31 MED ORDER — CAPECITABINE 500 MG PO TABS: 2000.0000 mg | ORAL_TABLET | Freq: Two times a day (BID) | ORAL | Status: DC

## 2013-10-31 NOTE — Addendum Note (Signed)
Addended by: Wyonia Hough on: 10/31/2013 04:49 PM   Modules accepted: Orders

## 2013-10-31 NOTE — Telephone Encounter (Signed)
Pt called and stated he has 1 week left on Xeloda meds.  Pt stated needing refill of med.  Called [facility] and requested a faxed refill for Dr. Benay Spice to sign. [facility]   Phone    [phone].

## 2013-10-31 NOTE — Telephone Encounter (Signed)
THIS REFILL REQUEST FOR CAPECITABINE WAS GIVEN TO DR.SHERRILL'S NURSE, SUSAN COWARD,RN. 

## 2013-11-17 ENCOUNTER — Other Ambulatory Visit: Payer: Self-pay | Admitting: Oncology

## 2013-11-20 ENCOUNTER — Ambulatory Visit: Payer: Federal, State, Local not specified - PPO

## 2013-11-20 ENCOUNTER — Telehealth: Payer: Self-pay | Admitting: Family Medicine

## 2013-11-20 ENCOUNTER — Ambulatory Visit (HOSPITAL_BASED_OUTPATIENT_CLINIC_OR_DEPARTMENT_OTHER): Payer: Federal, State, Local not specified - PPO | Admitting: Oncology

## 2013-11-20 ENCOUNTER — Telehealth: Payer: Self-pay | Admitting: *Deleted

## 2013-11-20 ENCOUNTER — Other Ambulatory Visit: Payer: Federal, State, Local not specified - PPO

## 2013-11-20 ENCOUNTER — Other Ambulatory Visit (HOSPITAL_BASED_OUTPATIENT_CLINIC_OR_DEPARTMENT_OTHER): Payer: Federal, State, Local not specified - PPO

## 2013-11-20 ENCOUNTER — Telehealth: Payer: Self-pay | Admitting: Oncology

## 2013-11-20 ENCOUNTER — Ambulatory Visit (HOSPITAL_BASED_OUTPATIENT_CLINIC_OR_DEPARTMENT_OTHER): Payer: Federal, State, Local not specified - PPO

## 2013-11-20 VITALS — BP 148/90 | HR 82 | Temp 97.3°F | Resp 18 | Ht 70.0 in | Wt 303.1 lb

## 2013-11-20 DIAGNOSIS — C189 Malignant neoplasm of colon, unspecified: Secondary | ICD-10-CM

## 2013-11-20 DIAGNOSIS — C187 Malignant neoplasm of sigmoid colon: Secondary | ICD-10-CM

## 2013-11-20 DIAGNOSIS — F411 Generalized anxiety disorder: Secondary | ICD-10-CM

## 2013-11-20 DIAGNOSIS — Z5111 Encounter for antineoplastic chemotherapy: Secondary | ICD-10-CM

## 2013-11-20 DIAGNOSIS — I10 Essential (primary) hypertension: Secondary | ICD-10-CM

## 2013-11-20 LAB — CBC WITH DIFFERENTIAL/PLATELET
BASO%: 0.9 % (ref 0.0–2.0)
BASOS ABS: 0 10*3/uL (ref 0.0–0.1)
EOS ABS: 0.2 10*3/uL (ref 0.0–0.5)
EOS%: 4.3 % (ref 0.0–7.0)
HCT: 41.6 % (ref 38.4–49.9)
HEMOGLOBIN: 13.9 g/dL (ref 13.0–17.1)
LYMPH%: 34.1 % (ref 14.0–49.0)
MCH: 28.8 pg (ref 27.2–33.4)
MCHC: 33.4 g/dL (ref 32.0–36.0)
MCV: 86.2 fL (ref 79.3–98.0)
MONO#: 0.6 10*3/uL (ref 0.1–0.9)
MONO%: 11.7 % (ref 0.0–14.0)
NEUT%: 49 % (ref 39.0–75.0)
NEUTROS ABS: 2.5 10*3/uL (ref 1.5–6.5)
Platelets: 185 10*3/uL (ref 140–400)
RBC: 4.82 10*6/uL (ref 4.20–5.82)
RDW: 18.9 % — AB (ref 11.0–14.6)
WBC: 5.2 10*3/uL (ref 4.0–10.3)
lymph#: 1.8 10*3/uL (ref 0.9–3.3)

## 2013-11-20 LAB — COMPREHENSIVE METABOLIC PANEL (CC13)
ALBUMIN: 3.7 g/dL (ref 3.5–5.0)
ALT: 29 U/L (ref 0–55)
ANION GAP: 11 meq/L (ref 3–11)
AST: 24 U/L (ref 5–34)
Alkaline Phosphatase: 146 U/L (ref 40–150)
BUN: 11.6 mg/dL (ref 7.0–26.0)
CALCIUM: 9.4 mg/dL (ref 8.4–10.4)
CHLORIDE: 106 meq/L (ref 98–109)
CO2: 25 meq/L (ref 22–29)
Creatinine: 0.8 mg/dL (ref 0.7–1.3)
GLUCOSE: 187 mg/dL — AB (ref 70–140)
POTASSIUM: 4 meq/L (ref 3.5–5.1)
Sodium: 142 mEq/L (ref 136–145)
Total Bilirubin: 0.2 mg/dL (ref 0.20–1.20)
Total Protein: 6.8 g/dL (ref 6.4–8.3)

## 2013-11-20 MED ORDER — HEPARIN SOD (PORK) LOCK FLUSH 100 UNIT/ML IV SOLN
500.0000 [IU] | Freq: Once | INTRAVENOUS | Status: AC | PRN
  Administered 2013-11-20: 500 [IU]
  Filled 2013-11-20: qty 5

## 2013-11-20 MED ORDER — DEXAMETHASONE SODIUM PHOSPHATE 10 MG/ML IJ SOLN
INTRAMUSCULAR | Status: AC
  Filled 2013-11-20: qty 1

## 2013-11-20 MED ORDER — DEXTROSE 5 % IV SOLN
Freq: Once | INTRAVENOUS | Status: AC
  Administered 2013-11-20: 12:00:00 via INTRAVENOUS

## 2013-11-20 MED ORDER — PALONOSETRON HCL INJECTION 0.25 MG/5ML
0.2500 mg | Freq: Once | INTRAVENOUS | Status: AC
  Administered 2013-11-20: 0.25 mg via INTRAVENOUS

## 2013-11-20 MED ORDER — DEXAMETHASONE SODIUM PHOSPHATE 10 MG/ML IJ SOLN
10.0000 mg | Freq: Once | INTRAMUSCULAR | Status: AC
  Administered 2013-11-20: 10 mg via INTRAVENOUS

## 2013-11-20 MED ORDER — PALONOSETRON HCL INJECTION 0.25 MG/5ML
INTRAVENOUS | Status: AC
  Filled 2013-11-20: qty 5

## 2013-11-20 MED ORDER — OXALIPLATIN CHEMO INJECTION 100 MG/20ML
130.0000 mg/m2 | Freq: Once | INTRAVENOUS | Status: AC
  Administered 2013-11-20: 330 mg via INTRAVENOUS
  Filled 2013-11-20: qty 66

## 2013-11-20 MED ORDER — SODIUM CHLORIDE 0.9 % IJ SOLN
10.0000 mL | INTRAMUSCULAR | Status: DC | PRN
  Administered 2013-11-20: 10 mL
  Filled 2013-11-20: qty 10

## 2013-11-20 NOTE — Patient Instructions (Signed)
[facility] Discharge Instructions for Patients Receiving Chemotherapy  Today you received the following chemotherapy agents oxaliplatin   To help prevent nausea and vomiting after your treatment, we encourage you to take your nausea medication as directed  If you develop nausea and vomiting that is not controlled by your nausea medication, call the clinic.   BELOW ARE SYMPTOMS THAT SHOULD BE REPORTED IMMEDIATELY:  *FEVER GREATER THAN 100.5 F  *CHILLS WITH OR WITHOUT FEVER  NAUSEA AND VOMITING THAT IS NOT CONTROLLED WITH YOUR NAUSEA MEDICATION  *UNUSUAL SHORTNESS OF BREATH  *UNUSUAL BRUISING OR BLEEDING  TENDERNESS IN MOUTH AND THROAT WITH OR WITHOUT PRESENCE OF ULCERS  *URINARY PROBLEMS  *BOWEL PROBLEMS  UNUSUAL RASH Items with * indicate a potential emergency and should be followed up as soon as possible.  Feel free to call the clinic you have any questions or concerns. The clinic phone number is [phone].  

## 2013-11-20 NOTE — Progress Notes (Signed)
   [place]    OFFICE PROGRESS NOTE   INTERVAL HISTORY:   He returns as scheduled. He completed cycle 3 CAPOX beginning 10/30/2013. He had nausea on day 2 following chemotherapy, but no emesis. Cold sensitivity lasted 3-4 days after chemotherapy. Mild tingling in the right first through third fingers. No numbness. No mouth sores, diarrhea, or hand/foot pain. He currently has a "cold ".  Objective:  Vital signs in last 24 hours:  Blood pressure 148/90, pulse 82, temperature 97.3 F (36.3 C), temperature source Oral, resp. rate 18, height _0  (1.778 m), weight 303 lb 1.6 oz (137.485 kg), SpO2 97.00%.    HEENT: no thrush or ulcers Resp: lungs clear bilaterally Cardio: regular rate and rhythm GI: no hepatomegaly, nontender, healed low abdominal incision Vascular: no leg edema Neuro:the vibratory sense is intact at the fingertips bilaterally  Skin:palms level erythema or skin breakdown   Portacath/PICC-without erythema  Lab Results:  Lab Results  Component Value Date   WBC 5.2 11/20/2013   HGB 13.9 11/20/2013   HCT 41.6 11/20/2013   MCV 86.2 11/20/2013   PLT 185 11/20/2013   NEUTROABS 2.5 11/20/2013      Medications: I have reviewed the patient's current medications.  Assessment/Plan: 1. 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 The tumor returned microsatellite stable with no loss of mismatch repair protein expression.  Cycle 1 adjuvant CAPOX beginning 09/18/2013. 2. Port-A-Cath placement 09/16/2013. 3. Gout. 4. History of anxiety maintained on Tegretol. 5. Hypertension.  Disposition:  John Hubbard has completed 3 cycles of adjuvant CAPOX. He has tolerated the chemotherapy well to date. The plan is to proceed with cycle 4 today. He will return for an office visit and cycle 5 in 3 weeks.   Betsy Coder, MD  11/20/2013  2:56 PM

## 2013-11-20 NOTE — Telephone Encounter (Signed)
Per staff message and POF I have scheduled appts.  JMW  

## 2013-11-20 NOTE — Telephone Encounter (Signed)
gave pt appt for lab and MD , emailed Sharyn Lull regarding chemp for April 2015

## 2013-11-20 NOTE — Telephone Encounter (Signed)
Talkled to pt's mother, gave her appt for lab,md and chemo for March and April 2015

## 2013-11-20 NOTE — Progress Notes (Signed)
Met with patient and his wife.  He is tolerating the chemo treatments well.  He denies any problems with eating or his diet.  He stated he is about to lose his unemployment benefits.  He expressed frustration re: difficulty finding a new job.  This RN offered to have SW contact him and he was very open to it.  This RN notified SW and he will be contacted today or tomorrow.  The patient verbalized no other barriers to care.  Will continue to follow as needed.

## 2013-11-22 ENCOUNTER — Encounter: Payer: Self-pay | Admitting: *Deleted

## 2013-11-22 NOTE — Progress Notes (Signed)
[facility] Work  Clinical Social Work was referred by patient navigator for assessment of psychosocial needs due to questions about disability.  Clinical Social Worker contacted patient at home to offer support and assess for needs.  Pt reports he was on unemployment, but this ended in July. He has been actively searching for employment, but was curious if he qualified for disability. CSW explained how disability works and Pt reports his doctor feels he can work. Pt reports his wife is actively employed, they have insurance through her work and currently is managing financially. CSW explained other possible resources available, but there are income guidelines associated with them. Pt was educated about Mining engineer and other [employer]. Pt denies current transportation or other concerns. He feels he is coping adequately at this time. He is interested in the GI support group and may attend in March. CSW has prepared a packet of additional resources related to careers and cancer, etc and Pt plans to pick up at his next appointment on 12/11/13. Pt agrees to phone CSW as needed.     Clinical Social Work interventions: Supportive listening Resource education  Loren Racer, [place] Clinical Social Worker [facility] for [facility] Wednesday, Thursday and Friday Phone: [phone] Fax: [phone]

## 2013-11-29 ENCOUNTER — Telehealth: Payer: Self-pay | Admitting: *Deleted

## 2013-11-29 NOTE — Telephone Encounter (Signed)
Reports still having cold symptoms. No fevers or cough, but has some wheezing. Has nebulizer machine at home & albuterol. Asking if OK to use this couple times/day? Reports his mom, who lives with them is in hospital with flu/pneumonia. Made wife aware that OK to use any OTC cold preparations he has used in the past and nebulizer would be OK per Dr. Benay Spice. Need to call office for fever if open, or go to urgent care if he develops dyspnea or fever.

## 2013-12-06 ENCOUNTER — Other Ambulatory Visit: Payer: Self-pay | Admitting: Oncology

## 2013-12-11 ENCOUNTER — Other Ambulatory Visit (HOSPITAL_BASED_OUTPATIENT_CLINIC_OR_DEPARTMENT_OTHER): Payer: Federal, State, Local not specified - PPO

## 2013-12-11 ENCOUNTER — Ambulatory Visit (HOSPITAL_BASED_OUTPATIENT_CLINIC_OR_DEPARTMENT_OTHER): Payer: Federal, State, Local not specified - PPO | Admitting: Nurse Practitioner

## 2013-12-11 ENCOUNTER — Telehealth: Payer: Self-pay | Admitting: Oncology

## 2013-12-11 ENCOUNTER — Other Ambulatory Visit: Payer: Self-pay | Admitting: *Deleted

## 2013-12-11 ENCOUNTER — Ambulatory Visit: Payer: Federal, State, Local not specified - PPO | Admitting: Nutrition

## 2013-12-11 ENCOUNTER — Ambulatory Visit (HOSPITAL_BASED_OUTPATIENT_CLINIC_OR_DEPARTMENT_OTHER): Payer: Federal, State, Local not specified - PPO

## 2013-12-11 DIAGNOSIS — C187 Malignant neoplasm of sigmoid colon: Secondary | ICD-10-CM

## 2013-12-11 DIAGNOSIS — Z5111 Encounter for antineoplastic chemotherapy: Secondary | ICD-10-CM

## 2013-12-11 DIAGNOSIS — C189 Malignant neoplasm of colon, unspecified: Secondary | ICD-10-CM

## 2013-12-11 DIAGNOSIS — R110 Nausea: Secondary | ICD-10-CM

## 2013-12-11 LAB — CBC WITH DIFFERENTIAL/PLATELET
BASO%: 0.9 % (ref 0.0–2.0)
BASOS ABS: 0 10*3/uL (ref 0.0–0.1)
EOS%: 4.2 % (ref 0.0–7.0)
Eosinophils Absolute: 0.2 10*3/uL (ref 0.0–0.5)
HEMATOCRIT: 41.7 % (ref 38.4–49.9)
HEMOGLOBIN: 14.1 g/dL (ref 13.0–17.1)
LYMPH%: 37.7 % (ref 14.0–49.0)
MCH: 29.5 pg (ref 27.2–33.4)
MCHC: 33.7 g/dL (ref 32.0–36.0)
MCV: 87.4 fL (ref 79.3–98.0)
MONO#: 0.4 10*3/uL (ref 0.1–0.9)
MONO%: 7.6 % (ref 0.0–14.0)
NEUT#: 2.7 10*3/uL (ref 1.5–6.5)
NEUT%: 49.6 % (ref 39.0–75.0)
PLATELETS: 230 10*3/uL (ref 140–400)
RBC: 4.77 10*6/uL (ref 4.20–5.82)
RDW: 20.1 % — ABNORMAL HIGH (ref 11.0–14.6)
WBC: 5.4 10*3/uL (ref 4.0–10.3)
lymph#: 2.1 10*3/uL (ref 0.9–3.3)

## 2013-12-11 LAB — COMPREHENSIVE METABOLIC PANEL (CC13)
ALT: 21 U/L (ref 0–55)
AST: 18 U/L (ref 5–34)
Albumin: 3.7 g/dL (ref 3.5–5.0)
Alkaline Phosphatase: 141 U/L (ref 40–150)
Anion Gap: 12 mEq/L — ABNORMAL HIGH (ref 3–11)
BUN: 17.4 mg/dL (ref 7.0–26.0)
CALCIUM: 9.4 mg/dL (ref 8.4–10.4)
CHLORIDE: 106 meq/L (ref 98–109)
CO2: 24 mEq/L (ref 22–29)
Creatinine: 0.9 mg/dL (ref 0.7–1.3)
Glucose: 141 mg/dl — ABNORMAL HIGH (ref 70–140)
Potassium: 4.1 mEq/L (ref 3.5–5.1)
Sodium: 142 mEq/L (ref 136–145)
Total Bilirubin: 0.3 mg/dL (ref 0.20–1.20)
Total Protein: 7.2 g/dL (ref 6.4–8.3)

## 2013-12-11 MED ORDER — PALONOSETRON HCL INJECTION 0.25 MG/5ML
INTRAVENOUS | Status: AC
  Filled 2013-12-11: qty 5

## 2013-12-11 MED ORDER — DEXAMETHASONE SODIUM PHOSPHATE 10 MG/ML IJ SOLN
INTRAMUSCULAR | Status: AC
  Filled 2013-12-11: qty 1

## 2013-12-11 MED ORDER — OXALIPLATIN CHEMO INJECTION 100 MG/20ML
130.0000 mg/m2 | Freq: Once | INTRAVENOUS | Status: AC
  Administered 2013-12-11: 330 mg via INTRAVENOUS
  Filled 2013-12-11: qty 66

## 2013-12-11 MED ORDER — SODIUM CHLORIDE 0.9 % IJ SOLN
10.0000 mL | INTRAMUSCULAR | Status: DC | PRN
  Administered 2013-12-11: 10 mL
  Filled 2013-12-11: qty 10

## 2013-12-11 MED ORDER — SODIUM CHLORIDE 0.9 % IV SOLN
150.0000 mg | Freq: Once | INTRAVENOUS | Status: AC
  Administered 2013-12-11: 150 mg via INTRAVENOUS
  Filled 2013-12-11: qty 5

## 2013-12-11 MED ORDER — HEPARIN SOD (PORK) LOCK FLUSH 100 UNIT/ML IV SOLN
500.0000 [IU] | Freq: Once | INTRAVENOUS | Status: AC | PRN
  Administered 2013-12-11: 500 [IU]
  Filled 2013-12-11: qty 5

## 2013-12-11 MED ORDER — CAPECITABINE 500 MG PO TABS: 2000.0000 mg | ORAL_TABLET | Freq: Two times a day (BID) | ORAL | Status: DC

## 2013-12-11 MED ORDER — PALONOSETRON HCL INJECTION 0.25 MG/5ML
0.2500 mg | Freq: Once | INTRAVENOUS | Status: AC
  Administered 2013-12-11: 0.25 mg via INTRAVENOUS

## 2013-12-11 MED ORDER — DEXAMETHASONE SODIUM PHOSPHATE 10 MG/ML IJ SOLN
10.0000 mg | Freq: Once | INTRAMUSCULAR | Status: AC
  Administered 2013-12-11: 10 mg via INTRAVENOUS

## 2013-12-11 MED ORDER — DEXTROSE 5 % IV SOLN
Freq: Once | INTRAVENOUS | Status: AC
  Administered 2013-12-11: 11:00:00 via INTRAVENOUS

## 2013-12-11 NOTE — Progress Notes (Signed)
Brief nutrition followup completed with patient.  Patient is being treated for stage III colon cancer.  Weight decreased slightly and was documented as 296.5 pounds March 11.  Decreased from 303.1 pounds February 18.  Patient reports he continues a healthy diet.  He has developed an aversion to sugar.  Patient verbalizes that he does not think he is consuming enough protein.  Nutrition diagnosis: Food and nutrition related knowledge deficit improved.  Intervention: Patient educated to continue healthy diet.  Recommended patient try protein supplement such is [place] chicken soup to provide an additional 20 g of protein daily.  Teach back method used.  Patient was provided with samples and purchasing information.  Next visit: Wednesday, April 1, during chemotherapy.

## 2013-12-11 NOTE — Progress Notes (Signed)
OFFICE PROGRESS NOTE  Interval history:  Mr. Buckles returns as scheduled. He completed cycle 4 CAPOX beginning 11/20/2013. He had mild nausea beginning day 2 and lasting for 3-4 days. No vomiting. He denies diarrhea. No mouth sores. No hand or foot pain or redness. Cold sensitivity lasted 3-4 days. He has mild intermittent tingling in the right hand.   Objective: There were no vitals filed for this visit. Oropharynx is without thrush or ulcerations. Lungs are clear. Regular cardiac rhythm. Port-A-Cath site without erythema. Abdomen soft and nontender. No hepatomegaly. No leg edema. Vibratory sense intact at the fingertips per tuning fork exam. Palms with mild erythema. No skin breakdown.   Lab Results: Lab Results  Component Value Date   WBC 5.4 12/11/2013   HGB 14.1 12/11/2013   HCT 41.7 12/11/2013   MCV 87.4 12/11/2013   PLT 230 12/11/2013   NEUTROABS 2.7 12/11/2013    Chemistry:    Chemistry      Component Value Date/Time   NA 142 12/11/2013 0930   NA 135 08/17/2013 0350   K 4.1 12/11/2013 0930   K 3.7 08/17/2013 0350   CL 96 08/17/2013 0350   CO2 24 12/11/2013 0930   CO2 33* 08/17/2013 0350   BUN 17.4 12/11/2013 0930   BUN 9 08/17/2013 0350   CREATININE 0.9 12/11/2013 0930   CREATININE 0.80 08/17/2013 0350      Component Value Date/Time   CALCIUM 9.4 12/11/2013 0930   CALCIUM 9.2 08/17/2013 0350   ALKPHOS 141 12/11/2013 0930   AST 18 12/11/2013 0930   ALT 21 12/11/2013 0930   BILITOT 0.30 12/11/2013 0930       Studies/Results: No results found.  Medications: I have reviewed the patient's current medications.  Assessment/Plan: 1. 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 The tumor returned microsatellite stable with no loss of mismatch repair protein expression.  Cycle 1 adjuvant CAPOX beginning 09/18/2013. 2. Port-A-Cath placement 09/16/2013. 3. Gout. 4. History of anxiety maintained on  Tegretol. 5. Hypertension. 6. Delayed nausea. He is receiving Aloxi. We will add Emend.   Dispositon-he appears stable. He has completed 4 cycles of adjuvant CAPOX. Plan to proceed with cycle 5 today as scheduled. As noted above he is experiencing delayed nausea. He is already receiving Aloxi. We will add Emend beginning today. He will return for a followup visit and cycle 6 in 3 weeks. He will contact the office in the interim with any problems.   Ned Card ANP/GNP-BC

## 2013-12-11 NOTE — Telephone Encounter (Signed)
gv and pritned appt sched and avs for pt for April....sed added tx.

## 2013-12-11 NOTE — Patient Instructions (Signed)
[facility] Discharge Instructions for Patients Receiving Chemotherapy  Today you received the following chemotherapy agents: Oxaliplatin  To help prevent nausea and vomiting after your treatment, we encourage you to take your nausea medication as prescribed by your physician.    If you develop nausea and vomiting that is not controlled by your nausea medication, call the clinic.   BELOW ARE SYMPTOMS THAT SHOULD BE REPORTED IMMEDIATELY:  *FEVER GREATER THAN 100.5 F  *CHILLS WITH OR WITHOUT FEVER  NAUSEA AND VOMITING THAT IS NOT CONTROLLED WITH YOUR NAUSEA MEDICATION  *UNUSUAL SHORTNESS OF BREATH  *UNUSUAL BRUISING OR BLEEDING  TENDERNESS IN MOUTH AND THROAT WITH OR WITHOUT PRESENCE OF ULCERS  *URINARY PROBLEMS  *BOWEL PROBLEMS  UNUSUAL RASH Items with * indicate a potential emergency and should be followed up as soon as possible.  Feel free to call the clinic you have any questions or concerns. The clinic phone number is [phone].

## 2013-12-12 ENCOUNTER — Telehealth: Payer: Self-pay | Admitting: *Deleted

## 2013-12-12 NOTE — Telephone Encounter (Signed)
Spoke with Earlie Server @ [facility].  Per Earlie Server, prescription for Capecitabine was received via e-scribe on 12/11/13.  Pharmacy is processing the prescription to send a shipment out to pt. Hoagland    [phone].

## 2013-12-29 ENCOUNTER — Other Ambulatory Visit: Payer: Self-pay | Admitting: Oncology

## 2014-01-01 ENCOUNTER — Other Ambulatory Visit (HOSPITAL_BASED_OUTPATIENT_CLINIC_OR_DEPARTMENT_OTHER): Payer: Federal, State, Local not specified - PPO

## 2014-01-01 ENCOUNTER — Telehealth: Payer: Self-pay | Admitting: Oncology

## 2014-01-01 ENCOUNTER — Ambulatory Visit (HOSPITAL_BASED_OUTPATIENT_CLINIC_OR_DEPARTMENT_OTHER): Payer: Federal, State, Local not specified - PPO

## 2014-01-01 ENCOUNTER — Ambulatory Visit: Payer: Federal, State, Local not specified - PPO | Admitting: Nutrition

## 2014-01-01 ENCOUNTER — Ambulatory Visit (HOSPITAL_BASED_OUTPATIENT_CLINIC_OR_DEPARTMENT_OTHER): Payer: Federal, State, Local not specified - PPO | Admitting: Oncology

## 2014-01-01 VITALS — BP 132/88 | HR 78 | Temp 98.7°F | Resp 18 | Ht 70.0 in | Wt 294.9 lb

## 2014-01-01 DIAGNOSIS — C189 Malignant neoplasm of colon, unspecified: Secondary | ICD-10-CM

## 2014-01-01 DIAGNOSIS — C187 Malignant neoplasm of sigmoid colon: Secondary | ICD-10-CM

## 2014-01-01 DIAGNOSIS — Z5111 Encounter for antineoplastic chemotherapy: Secondary | ICD-10-CM

## 2014-01-01 LAB — COMPREHENSIVE METABOLIC PANEL (CC13)
ALT: 18 U/L (ref 0–55)
AST: 21 U/L (ref 5–34)
Albumin: 3.9 g/dL (ref 3.5–5.0)
Alkaline Phosphatase: 128 U/L (ref 40–150)
Anion Gap: 12 mEq/L — ABNORMAL HIGH (ref 3–11)
BUN: 17.1 mg/dL (ref 7.0–26.0)
CO2: 24 mEq/L (ref 22–29)
Calcium: 9.4 mg/dL (ref 8.4–10.4)
Chloride: 107 mEq/L (ref 98–109)
Creatinine: 0.9 mg/dL (ref 0.7–1.3)
Glucose: 128 mg/dl (ref 70–140)
Potassium: 4.2 mEq/L (ref 3.5–5.1)
Sodium: 143 mEq/L (ref 136–145)
Total Bilirubin: 0.34 mg/dL (ref 0.20–1.20)
Total Protein: 7.2 g/dL (ref 6.4–8.3)

## 2014-01-01 LAB — CBC WITH DIFFERENTIAL/PLATELET
BASO%: 0.6 % (ref 0.0–2.0)
Basophils Absolute: 0 10e3/uL (ref 0.0–0.1)
EOS%: 3.3 % (ref 0.0–7.0)
Eosinophils Absolute: 0.2 10e3/uL (ref 0.0–0.5)
HCT: 40.5 % (ref 38.4–49.9)
HGB: 13.8 g/dL (ref 13.0–17.1)
LYMPH%: 29.2 % (ref 14.0–49.0)
MCH: 30.2 pg (ref 27.2–33.4)
MCHC: 34 g/dL (ref 32.0–36.0)
MCV: 88.8 fL (ref 79.3–98.0)
MONO#: 0.4 10e3/uL (ref 0.1–0.9)
MONO%: 6.5 % (ref 0.0–14.0)
NEUT#: 4.1 10e3/uL (ref 1.5–6.5)
NEUT%: 60.4 % (ref 39.0–75.0)
Platelets: 211 10e3/uL (ref 140–400)
RBC: 4.56 10e6/uL (ref 4.20–5.82)
RDW: 20.7 % — ABNORMAL HIGH (ref 11.0–14.6)
WBC: 6.9 10e3/uL (ref 4.0–10.3)
lymph#: 2 10e3/uL (ref 0.9–3.3)

## 2014-01-01 MED ORDER — SODIUM CHLORIDE 0.9 % IV SOLN
150.0000 mg | Freq: Once | INTRAVENOUS | Status: AC
  Administered 2014-01-01: 150 mg via INTRAVENOUS
  Filled 2014-01-01: qty 5

## 2014-01-01 MED ORDER — PALONOSETRON HCL INJECTION 0.25 MG/5ML
INTRAVENOUS | Status: AC
  Filled 2014-01-01: qty 5

## 2014-01-01 MED ORDER — DEXTROSE 5 % IV SOLN
Freq: Once | INTRAVENOUS | Status: AC
  Administered 2014-01-01: 10:00:00 via INTRAVENOUS

## 2014-01-01 MED ORDER — HEPARIN SOD (PORK) LOCK FLUSH 100 UNIT/ML IV SOLN
500.0000 [IU] | Freq: Once | INTRAVENOUS | Status: AC | PRN
  Administered 2014-01-01: 500 [IU]
  Filled 2014-01-01: qty 5

## 2014-01-01 MED ORDER — PALONOSETRON HCL INJECTION 0.25 MG/5ML
0.2500 mg | Freq: Once | INTRAVENOUS | Status: AC
  Administered 2014-01-01: 0.25 mg via INTRAVENOUS

## 2014-01-01 MED ORDER — DEXAMETHASONE SODIUM PHOSPHATE 10 MG/ML IJ SOLN
10.0000 mg | Freq: Once | INTRAMUSCULAR | Status: AC
  Administered 2014-01-01: 10 mg via INTRAVENOUS

## 2014-01-01 MED ORDER — OXALIPLATIN CHEMO INJECTION 100 MG/20ML
130.0000 mg/m2 | Freq: Once | INTRAVENOUS | Status: AC
  Administered 2014-01-01: 330 mg via INTRAVENOUS
  Filled 2014-01-01: qty 66

## 2014-01-01 MED ORDER — DEXAMETHASONE SODIUM PHOSPHATE 10 MG/ML IJ SOLN
INTRAMUSCULAR | Status: AC
  Filled 2014-01-01: qty 1

## 2014-01-01 MED ORDER — SODIUM CHLORIDE 0.9 % IJ SOLN
10.0000 mL | INTRAMUSCULAR | Status: DC | PRN
  Administered 2014-01-01: 10 mL
  Filled 2014-01-01: qty 10

## 2014-01-01 NOTE — Telephone Encounter (Signed)
gv pt appt schedule for april/may °

## 2014-01-01 NOTE — Progress Notes (Signed)
Met with patient and his wife.  His mother passed away last week.  Emotional support was given.  He is very tired from the events of last week and stated he has not been eating like he should.  He was seen by dietician.  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  He hopes that next chemo treatment will coincide with is work schedule.  He denies current barriers to care or other needs.  Will continue to follow.

## 2014-01-01 NOTE — Progress Notes (Signed)
Brief followup completed with patient and wife in chemotherapy.  Patient reports that his mother passed away recently, so that his oral intake has changed and he has gravitated towards more casseroles and refined carbohydrates.  Weight was documented as 294.9 pounds on April 1, which is decreased from 296.5 pounds March 11.  Patient generally tries to consume a healthy diet with adequate protein.  He is focused on healthy weight loss.  Nutrition diagnosis: Food and nutrition related knowledge deficit improved.  Intervention: Provided support and encouragement for patient to consume healthy diet as able.  Monitoring, evaluation, goals: Patient will tolerate a healthy, plant-based diet for safe weight loss.  Next visit: Wednesday, May 13, during chemotherapy.

## 2014-01-01 NOTE — Patient Instructions (Signed)
[facility] Discharge Instructions for Patients Receiving Chemotherapy  Today you received the following chemotherapy agents oxaliplatin  To help prevent nausea and vomiting after your treatment, we encourage you to take your nausea medication if needed starting about 5 pm.   If you develop nausea and vomiting that is not controlled by your nausea medication, call the clinic.   BELOW ARE SYMPTOMS THAT SHOULD BE REPORTED IMMEDIATELY:  *FEVER GREATER THAN 100.5 F  *CHILLS WITH OR WITHOUT FEVER  NAUSEA AND VOMITING THAT IS NOT CONTROLLED WITH YOUR NAUSEA MEDICATION  *UNUSUAL SHORTNESS OF BREATH  *UNUSUAL BRUISING OR BLEEDING  TENDERNESS IN MOUTH AND THROAT WITH OR WITHOUT PRESENCE OF ULCERS  *URINARY PROBLEMS  *BOWEL PROBLEMS  UNUSUAL RASH Items with * indicate a potential emergency and should be followed up as soon as possible.  Feel free to call the clinic you have any questions or concerns. The clinic phone number is [phone].

## 2014-01-01 NOTE — Progress Notes (Signed)
  [place] OFFICE PROGRESS NOTE   Diagnosis: Colon cancer  INTERVAL HISTORY:   He completed cycle 5 CAPOX 12/11/2013. He reports less nausea following this cycle of chemotherapy. Cold sensitivity lasted 3-5 days following chemotherapy. Minimal numbness in a few fingers of the right hand. No hand or foot erythema/pain.  Objective:  Vital signs in last 24 hours:  Blood pressure 132/88, pulse 78, temperature 98.7 F (37.1 C), temperature source Oral, resp. rate 18, height $RemoveBe'5\' 10"'hIJNSFLGo$  (1.778 m), weight 294 lb 14.4 oz (133.766 kg), SpO2 100.00%.    HEENT: No thrush or ulcers Resp: Lungs clear bilaterally Cardio: Regular rate and rhythm GI: No hepatomegaly, nontender Vascular: No leg edema Neuro: The vibratory sense is intact at the fingertips bilaterally  Skin: Palms without erythema or skin breakdown   Portacath/PICC-without erythema  Lab Results:  Lab Results  Component Value Date   WBC 6.9 01/01/2014   HGB 13.8 01/01/2014   HCT 40.5 01/01/2014   MCV 88.8 01/01/2014   PLT 211 01/01/2014   NEUTROABS 4.1 01/01/2014    Imaging:  No results found.  Medications: I have reviewed the patient's current medications.  Assessment/Plan: 1. 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 The tumor returned microsatellite stable with no loss of mismatch repair protein expression.  Cycle 1 adjuvant CAPOX beginning 09/18/2013. 2. Port-A-Cath placement 09/16/2013. 3. Gout. 4. History of anxiety maintained on Tegretol. 5. Hypertension. 6. Delayed nausea. Improved with Aloxi/Emend  Disposition:  [name] has completed 5 cycles of adjuvant CAPOX. He continues to tolerate the chemotherapy well. The plan is to proceed with cycle 6 today. He will return for an office visit and chemotherapy in 3 weeks.  Betsy Coder, MD  01/01/2014  9:24 AM

## 2014-01-03 ENCOUNTER — Telehealth: Payer: Self-pay | Admitting: *Deleted

## 2014-01-03 NOTE — Telephone Encounter (Signed)
VM that he missed the am dose of chemo yesterday. Inquires what to do? Called back and left VM to add the missed dose to the end of his cycle.

## 2014-01-06 ENCOUNTER — Other Ambulatory Visit: Payer: Self-pay | Admitting: *Deleted

## 2014-01-06 NOTE — Telephone Encounter (Signed)
VM from pharmacy to follow up on status of Xeloda refill. Refill request was placed on MD desk this am (from prior fax).

## 2014-01-07 ENCOUNTER — Encounter (INDEPENDENT_AMBULATORY_CARE_PROVIDER_SITE_OTHER): Payer: Self-pay | Admitting: General Surgery

## 2014-01-07 ENCOUNTER — Other Ambulatory Visit: Payer: Self-pay | Admitting: *Deleted

## 2014-01-07 DIAGNOSIS — C189 Malignant neoplasm of colon, unspecified: Secondary | ICD-10-CM

## 2014-01-07 MED ORDER — CAPECITABINE 500 MG PO TABS: 2000.0000 mg | ORAL_TABLET | Freq: Two times a day (BID) | ORAL | Status: DC

## 2014-01-07 NOTE — Telephone Encounter (Signed)
RX refill faxed

## 2014-01-19 ENCOUNTER — Other Ambulatory Visit: Payer: Self-pay | Admitting: Oncology

## 2014-01-22 ENCOUNTER — Ambulatory Visit (HOSPITAL_BASED_OUTPATIENT_CLINIC_OR_DEPARTMENT_OTHER): Payer: Federal, State, Local not specified - PPO | Admitting: Nurse Practitioner

## 2014-01-22 ENCOUNTER — Other Ambulatory Visit (HOSPITAL_BASED_OUTPATIENT_CLINIC_OR_DEPARTMENT_OTHER): Payer: Federal, State, Local not specified - PPO

## 2014-01-22 ENCOUNTER — Ambulatory Visit (HOSPITAL_BASED_OUTPATIENT_CLINIC_OR_DEPARTMENT_OTHER): Payer: Federal, State, Local not specified - PPO

## 2014-01-22 VITALS — BP 144/91 | HR 81 | Temp 97.7°F | Resp 19 | Ht 70.0 in | Wt 285.5 lb

## 2014-01-22 DIAGNOSIS — C187 Malignant neoplasm of sigmoid colon: Secondary | ICD-10-CM

## 2014-01-22 DIAGNOSIS — C189 Malignant neoplasm of colon, unspecified: Secondary | ICD-10-CM

## 2014-01-22 DIAGNOSIS — Z5111 Encounter for antineoplastic chemotherapy: Secondary | ICD-10-CM

## 2014-01-22 DIAGNOSIS — F411 Generalized anxiety disorder: Secondary | ICD-10-CM

## 2014-01-22 DIAGNOSIS — I10 Essential (primary) hypertension: Secondary | ICD-10-CM

## 2014-01-22 LAB — CBC WITH DIFFERENTIAL/PLATELET
BASO%: 0.7 % (ref 0.0–2.0)
Basophils Absolute: 0 10*3/uL (ref 0.0–0.1)
EOS ABS: 0.2 10*3/uL (ref 0.0–0.5)
EOS%: 3.3 % (ref 0.0–7.0)
HCT: 42.9 % (ref 38.4–49.9)
HGB: 14.6 g/dL (ref 13.0–17.1)
LYMPH#: 2 10*3/uL (ref 0.9–3.3)
LYMPH%: 33.5 % (ref 14.0–49.0)
MCH: 31.4 pg (ref 27.2–33.4)
MCHC: 34.1 g/dL (ref 32.0–36.0)
MCV: 92.1 fL (ref 79.3–98.0)
MONO#: 0.6 10*3/uL (ref 0.1–0.9)
MONO%: 9.9 % (ref 0.0–14.0)
NEUT%: 52.6 % (ref 39.0–75.0)
NEUTROS ABS: 3.2 10*3/uL (ref 1.5–6.5)
PLATELETS: 229 10*3/uL (ref 140–400)
RBC: 4.65 10*6/uL (ref 4.20–5.82)
RDW: 20 % — ABNORMAL HIGH (ref 11.0–14.6)
WBC: 6 10*3/uL (ref 4.0–10.3)

## 2014-01-22 LAB — COMPREHENSIVE METABOLIC PANEL (CC13)
ALT: 26 U/L (ref 0–55)
ANION GAP: 11 meq/L (ref 3–11)
AST: 22 U/L (ref 5–34)
Albumin: 4.1 g/dL (ref 3.5–5.0)
Alkaline Phosphatase: 157 U/L — ABNORMAL HIGH (ref 40–150)
BILIRUBIN TOTAL: 0.32 mg/dL (ref 0.20–1.20)
BUN: 16.4 mg/dL (ref 7.0–26.0)
CALCIUM: 10 mg/dL (ref 8.4–10.4)
CHLORIDE: 103 meq/L (ref 98–109)
CO2: 26 meq/L (ref 22–29)
Creatinine: 0.9 mg/dL (ref 0.7–1.3)
GLUCOSE: 111 mg/dL (ref 70–140)
Potassium: 4.2 mEq/L (ref 3.5–5.1)
Sodium: 139 mEq/L (ref 136–145)
TOTAL PROTEIN: 7.9 g/dL (ref 6.4–8.3)

## 2014-01-22 MED ORDER — PALONOSETRON HCL INJECTION 0.25 MG/5ML
INTRAVENOUS | Status: AC
  Filled 2014-01-22: qty 5

## 2014-01-22 MED ORDER — DEXTROSE 5 % IV SOLN
Freq: Once | INTRAVENOUS | Status: AC
  Administered 2014-01-22: 11:00:00 via INTRAVENOUS

## 2014-01-22 MED ORDER — HEPARIN SOD (PORK) LOCK FLUSH 100 UNIT/ML IV SOLN
500.0000 [IU] | Freq: Once | INTRAVENOUS | Status: AC | PRN
  Administered 2014-01-22: 500 [IU]
  Filled 2014-01-22: qty 5

## 2014-01-22 MED ORDER — PALONOSETRON HCL INJECTION 0.25 MG/5ML
0.2500 mg | Freq: Once | INTRAVENOUS | Status: AC
  Administered 2014-01-22: 0.25 mg via INTRAVENOUS

## 2014-01-22 MED ORDER — OXALIPLATIN CHEMO INJECTION 100 MG/20ML
130.0000 mg/m2 | Freq: Once | INTRAVENOUS | Status: AC
  Administered 2014-01-22: 330 mg via INTRAVENOUS
  Filled 2014-01-22: qty 66

## 2014-01-22 MED ORDER — DEXAMETHASONE SODIUM PHOSPHATE 10 MG/ML IJ SOLN
INTRAMUSCULAR | Status: AC
  Filled 2014-01-22: qty 1

## 2014-01-22 MED ORDER — SODIUM CHLORIDE 0.9 % IJ SOLN
10.0000 mL | INTRAMUSCULAR | Status: DC | PRN
  Administered 2014-01-22: 10 mL
  Filled 2014-01-22: qty 10

## 2014-01-22 MED ORDER — SODIUM CHLORIDE 0.9 % IV SOLN
150.0000 mg | Freq: Once | INTRAVENOUS | Status: AC
  Administered 2014-01-22: 150 mg via INTRAVENOUS
  Filled 2014-01-22: qty 5

## 2014-01-22 MED ORDER — CAPECITABINE 500 MG PO TABS: 2000.0000 mg | ORAL_TABLET | Freq: Two times a day (BID) | ORAL | Status: DC

## 2014-01-22 MED ORDER — DEXAMETHASONE SODIUM PHOSPHATE 10 MG/ML IJ SOLN
10.0000 mg | Freq: Once | INTRAMUSCULAR | Status: AC
  Administered 2014-01-22: 10 mg via INTRAVENOUS

## 2014-01-22 NOTE — Patient Instructions (Signed)
[facility] Discharge Instructions for Patients Receiving Chemotherapy  Today you received the following chemotherapy agents Oxaliplatin.  To help prevent nausea and vomiting after your treatment, we encourage you to take your nausea medication.   If you develop nausea and vomiting that is not controlled by your nausea medication, call the clinic.   BELOW ARE SYMPTOMS THAT SHOULD BE REPORTED IMMEDIATELY:  *FEVER GREATER THAN 100.5 F  *CHILLS WITH OR WITHOUT FEVER  NAUSEA AND VOMITING THAT IS NOT CONTROLLED WITH YOUR NAUSEA MEDICATION  *UNUSUAL SHORTNESS OF BREATH  *UNUSUAL BRUISING OR BLEEDING  TENDERNESS IN MOUTH AND THROAT WITH OR WITHOUT PRESENCE OF ULCERS  *URINARY PROBLEMS  *BOWEL PROBLEMS  UNUSUAL RASH Items with * indicate a potential emergency and should be followed up as soon as possible.  Feel free to call the clinic you have any questions or concerns. The clinic phone number is [phone].    

## 2014-01-22 NOTE — Progress Notes (Signed)
  [place] OFFICE PROGRESS NOTE   Diagnosis:  Colon cancer.  INTERVAL HISTORY:   He returns as scheduled. He completed cycle 6 CAPOX beginning 01/01/2014. He had mild nausea for a few days after the oxaliplatin. No vomiting. No mouth sores. No diarrhea. The cold sensitivity lasted about 3-4 days. He has mild intermittent tingling in a few fingers on the right hand. No hand or foot pain or redness.  Objective:  Vital signs in last 24 hours:  Blood pressure 144/91, pulse 81, temperature 97.7 F (36.5 C), temperature source Oral, resp. rate 19, height $RemoveBe'5\' 10"'baPLboVQh$  (1.778 m), weight 285 lb 8 oz (129.502 kg).    HEENT: No thrush or ulcerations. Resp: Lungs clear. Cardio: Regular cardiac rhythm. GI: Abdomen soft and nontender. No hepatomegaly. Vascular: No leg edema. Neuro: Vibratory sense mildly decreased to intact over the fingertips.  Port-A-Cath site without erythema.     Lab Results:  Lab Results  Component Value Date   WBC 6.0 01/22/2014   HGB 14.6 01/22/2014   HCT 42.9 01/22/2014   MCV 92.1 01/22/2014   PLT 229 01/22/2014   NEUTROABS 3.2 01/22/2014    Imaging:  No results found.  Medications: I have reviewed the patient's current medications.  Assessment/Plan: 1. 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 The tumor returned microsatellite stable with no loss of mismatch repair protein expression.  Cycle 1 adjuvant CAPOX beginning 09/18/2013. 2. Port-A-Cath placement 09/16/2013. 3. Gout. 4. History of anxiety maintained on Tegretol. 5. Hypertension. 6. Delayed nausea. Improved with Aloxi/Emend   Disposition: He appears stable. He has completed 6 cycles of adjuvant CAPOX. Plan to proceed with cycle 7 today as scheduled. He will return for a followup visit and cycle 8 in 3 weeks. He will contact the office in the interim with any problems.  Plan reviewed with Dr.  Benay Spice.    [place] Shark ANP/GNP-BC   01/22/2014  10:27 AM

## 2014-02-09 ENCOUNTER — Other Ambulatory Visit: Payer: Self-pay | Admitting: Oncology

## 2014-02-12 ENCOUNTER — Encounter: Payer: Federal, State, Local not specified - PPO | Admitting: Nutrition

## 2014-02-12 ENCOUNTER — Telehealth: Payer: Self-pay | Admitting: Oncology

## 2014-02-12 ENCOUNTER — Ambulatory Visit (HOSPITAL_BASED_OUTPATIENT_CLINIC_OR_DEPARTMENT_OTHER): Payer: Federal, State, Local not specified - PPO | Admitting: Oncology

## 2014-02-12 ENCOUNTER — Ambulatory Visit: Payer: Federal, State, Local not specified - PPO

## 2014-02-12 ENCOUNTER — Other Ambulatory Visit (HOSPITAL_BASED_OUTPATIENT_CLINIC_OR_DEPARTMENT_OTHER): Payer: Federal, State, Local not specified - PPO

## 2014-02-12 VITALS — BP 149/96 | HR 73 | Temp 97.5°F | Resp 18 | Ht 70.0 in | Wt 291.4 lb

## 2014-02-12 DIAGNOSIS — G620 Drug-induced polyneuropathy: Secondary | ICD-10-CM

## 2014-02-12 DIAGNOSIS — C189 Malignant neoplasm of colon, unspecified: Secondary | ICD-10-CM

## 2014-02-12 DIAGNOSIS — C187 Malignant neoplasm of sigmoid colon: Secondary | ICD-10-CM

## 2014-02-12 DIAGNOSIS — I10 Essential (primary) hypertension: Secondary | ICD-10-CM

## 2014-02-12 DIAGNOSIS — R21 Rash and other nonspecific skin eruption: Secondary | ICD-10-CM

## 2014-02-12 LAB — CBC WITH DIFFERENTIAL/PLATELET
BASO%: 0.6 % (ref 0.0–2.0)
Basophils Absolute: 0 10*3/uL (ref 0.0–0.1)
EOS%: 3.9 % (ref 0.0–7.0)
Eosinophils Absolute: 0.2 10*3/uL (ref 0.0–0.5)
HCT: 39.3 % (ref 38.4–49.9)
HGB: 13.3 g/dL (ref 13.0–17.1)
LYMPH%: 33.7 % (ref 14.0–49.0)
MCH: 31.2 pg (ref 27.2–33.4)
MCHC: 33.8 g/dL (ref 32.0–36.0)
MCV: 92.3 fL (ref 79.3–98.0)
MONO#: 0.6 10*3/uL (ref 0.1–0.9)
MONO%: 10.9 % (ref 0.0–14.0)
NEUT#: 2.6 10*3/uL (ref 1.5–6.5)
NEUT%: 50.9 % (ref 39.0–75.0)
PLATELETS: 205 10*3/uL (ref 140–400)
RBC: 4.26 10*6/uL (ref 4.20–5.82)
RDW: 16.9 % — ABNORMAL HIGH (ref 11.0–14.6)
WBC: 5.2 10*3/uL (ref 4.0–10.3)
lymph#: 1.7 10*3/uL (ref 0.9–3.3)

## 2014-02-12 LAB — COMPREHENSIVE METABOLIC PANEL (CC13)
ALK PHOS: 146 U/L (ref 40–150)
ALT: 29 U/L (ref 0–55)
AST: 21 U/L (ref 5–34)
Albumin: 3.6 g/dL (ref 3.5–5.0)
Anion Gap: 13 mEq/L — ABNORMAL HIGH (ref 3–11)
BILIRUBIN TOTAL: 0.27 mg/dL (ref 0.20–1.20)
BUN: 17.2 mg/dL (ref 7.0–26.0)
CO2: 26 mEq/L (ref 22–29)
Calcium: 9.8 mg/dL (ref 8.4–10.4)
Chloride: 105 mEq/L (ref 98–109)
Creatinine: 0.9 mg/dL (ref 0.7–1.3)
Glucose: 119 mg/dl (ref 70–140)
Potassium: 4.7 mEq/L (ref 3.5–5.1)
SODIUM: 144 meq/L (ref 136–145)
TOTAL PROTEIN: 7.3 g/dL (ref 6.4–8.3)

## 2014-02-12 NOTE — Progress Notes (Signed)
  [facility] OFFICE PROGRESS NOTE   Diagnosis: Colon cancer  INTERVAL HISTORY:   He completed cycle 7 of CAPOX beginning 01/22/2014. No nausea. He has noted increased numbness and tingling in the fingers and toes. He developed a rash approximately 1-1/2 weeks following the oxaliplatin. The rash was over the lower and upper legs. The rash has improved. Minimal pruritus.   Objective:  Vital signs in last 24 hours:  Blood pressure 149/96, pulse 73, temperature 97.5 F (36.4 C), temperature source Oral, resp. rate 18, height $RemoveBe'5\' 10"'XuVlZqsET$  (1.778 m), weight 291 lb 6.4 oz (132.178 kg), SpO2 95.00%.    HEENT: No thrush or ulcers Resp: Lungs clear bilaterally Cardio: Regular rate and rhythm GI: No hepatomegaly, nontender Vascular: No leg edema Neuro: Mild loss of vibratory sense at the fingertips (right greater than left) and toes bilaterally  Skin: Erythematous slightly raised rash over the legs. No vesicles.   Portacath/PICC-without erythema  Lab Results:  Lab Results  Component Value Date   WBC 5.2 02/12/2014   HGB 13.3 02/12/2014   HCT 39.3 02/12/2014   MCV 92.3 02/12/2014   PLT 205 02/12/2014   NEUTROABS 2.6 02/12/2014     Imaging:  No results found.  Medications: I have reviewed the patient's current medications.  Assessment/Plan: 1. 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 The tumor returned microsatellite stable with no loss of mismatch repair protein expression.  Cycle 1 adjuvant CAPOX beginning 09/18/2013. Cycle 7 CAPOX 01/22/2014 Cycle 8 CAPOX 02/12/2014 (oxaliplatin deleted) 2. Port-A-Cath placement 09/16/2013. 3. Gout. 4. History of anxiety maintained on Tegretol. 5. Hypertension. 6. Delayed nausea. Improved with Aloxi/Emend 7. Rash-the rash occurred following cycle 7 CAPOX. is possible the rash is related to capecitabine. I doubt the rash is an allergic reaction to  oxaliplatin. 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 He has developed oxaliplatin neuropathy. We decided to discontinue oxaliplatin with cycle 8.  He had a rash following cycle 7. The rash is resolving. It is possible the rash is related to capecitabine. The plan is to proceed with cycle 8 capecitabine today. He will discontinue capecitabine and contact us if the rash progresses.  John Hubbard  will schedule an appointment with Dr. Rex Kras for evaluation and management of hypertension.  He will return for an office visit and CEA in 3 weeks.  Ladell Pier, MD  02/12/2014  12:54 PM

## 2014-02-12 NOTE — Telephone Encounter (Signed)
Gave pt appt for lab and MD for june 2015

## 2014-03-04 ENCOUNTER — Telehealth: Payer: Self-pay | Admitting: Oncology

## 2014-03-04 NOTE — Telephone Encounter (Signed)
RETURNED PT'S CALL RE R/S 6/3 APPT BUT WAS NOT ABLE TO REACH HIM. APPT CX - LMONVM FOR PT AND ASKED THAT HE CALL BACK TO R/S.

## 2014-03-05 ENCOUNTER — Other Ambulatory Visit: Payer: Federal, State, Local not specified - PPO

## 2014-03-05 ENCOUNTER — Ambulatory Visit: Payer: Federal, State, Local not specified - PPO | Admitting: Nurse Practitioner

## 2014-03-06 ENCOUNTER — Telehealth: Payer: Self-pay | Admitting: Oncology

## 2014-03-06 NOTE — Telephone Encounter (Signed)
pt wife called to r/s appt...done.Marland Kitchen

## 2014-03-19 ENCOUNTER — Ambulatory Visit (HOSPITAL_BASED_OUTPATIENT_CLINIC_OR_DEPARTMENT_OTHER): Payer: Federal, State, Local not specified - PPO | Admitting: Nurse Practitioner

## 2014-03-19 ENCOUNTER — Ambulatory Visit (HOSPITAL_BASED_OUTPATIENT_CLINIC_OR_DEPARTMENT_OTHER): Payer: Federal, State, Local not specified - PPO

## 2014-03-19 ENCOUNTER — Other Ambulatory Visit (HOSPITAL_BASED_OUTPATIENT_CLINIC_OR_DEPARTMENT_OTHER): Payer: Federal, State, Local not specified - PPO

## 2014-03-19 VITALS — BP 132/86 | HR 73 | Temp 98.1°F | Resp 20 | Ht 70.0 in | Wt 299.0 lb

## 2014-03-19 DIAGNOSIS — G620 Drug-induced polyneuropathy: Secondary | ICD-10-CM

## 2014-03-19 DIAGNOSIS — C189 Malignant neoplasm of colon, unspecified: Secondary | ICD-10-CM

## 2014-03-19 DIAGNOSIS — C187 Malignant neoplasm of sigmoid colon: Secondary | ICD-10-CM

## 2014-03-19 DIAGNOSIS — Z452 Encounter for adjustment and management of vascular access device: Secondary | ICD-10-CM

## 2014-03-19 DIAGNOSIS — Z95828 Presence of other vascular implants and grafts: Secondary | ICD-10-CM

## 2014-03-19 DIAGNOSIS — I10 Essential (primary) hypertension: Secondary | ICD-10-CM

## 2014-03-19 LAB — CBC WITH DIFFERENTIAL/PLATELET
BASO%: 0.8 % (ref 0.0–2.0)
BASOS ABS: 0.1 10*3/uL (ref 0.0–0.1)
EOS%: 3.4 % (ref 0.0–7.0)
Eosinophils Absolute: 0.3 10*3/uL (ref 0.0–0.5)
HEMATOCRIT: 44.8 % (ref 38.4–49.9)
HEMOGLOBIN: 14.9 g/dL (ref 13.0–17.1)
LYMPH%: 34.8 % (ref 14.0–49.0)
MCH: 30.9 pg (ref 27.2–33.4)
MCHC: 33.3 g/dL (ref 32.0–36.0)
MCV: 93 fL (ref 79.3–98.0)
MONO#: 0.6 10*3/uL (ref 0.1–0.9)
MONO%: 8 % (ref 0.0–14.0)
NEUT#: 4.1 10*3/uL (ref 1.5–6.5)
NEUT%: 53 % (ref 39.0–75.0)
Platelets: 210 10*3/uL (ref 140–400)
RBC: 4.81 10*6/uL (ref 4.20–5.82)
RDW: 16.2 % — ABNORMAL HIGH (ref 11.0–14.6)
WBC: 7.7 10*3/uL (ref 4.0–10.3)
lymph#: 2.7 10*3/uL (ref 0.9–3.3)

## 2014-03-19 MED ORDER — SODIUM CHLORIDE 0.9 % IJ SOLN
10.0000 mL | INTRAMUSCULAR | Status: DC | PRN
  Administered 2014-03-19: 10 mL via INTRAVENOUS
  Filled 2014-03-19: qty 10

## 2014-03-19 MED ORDER — DOXYCYCLINE HYCLATE 100 MG PO TABS: 100.0000 mg | ORAL_TABLET | Freq: Two times a day (BID) | ORAL | Status: DC

## 2014-03-19 MED ORDER — HEPARIN SOD (PORK) LOCK FLUSH 100 UNIT/ML IV SOLN
500.0000 [IU] | Freq: Once | INTRAVENOUS | Status: AC
  Administered 2014-03-19: 500 [IU] via INTRAVENOUS
  Filled 2014-03-19: qty 5

## 2014-03-19 NOTE — Progress Notes (Addendum)
  [place] OFFICE PROGRESS NOTE   Diagnosis:  Colon cancer.  INTERVAL HISTORY:   Mr. Mella returns as scheduled. He completed the eighth and final cycle of systemic therapy with Xeloda beginning 02/12/2014. Oxaliplatin was held due to neuropathy. He continues to note numbness and tingling in the fingers and toes. He denies nausea/vomiting, mouth sores and diarrhea. The skin rash he had at the time of his last visit is better. He denies pain.   He has a wound at the left lower leg. He denies known injury. He wonders if he could have been bitten by a tick.     Objective:  Vital signs in last 24 hours:  Blood pressure 132/86, pulse 73, temperature 98.1 F (36.7 C), temperature source Oral, resp. rate 20, height $RemoveBe'5\' 10"'gVLHIhivN$  (1.778 m), weight 299 lb (135.626 kg), SpO2 100.00%.    HEENT: No thrush or ulcerations. Lymphatics: No palpable cervical, supraclavicular, axillary and inguinal lymph nodes.  Resp: Lungs clear.  Cardio: Regular cardiac rhythm.  GI: Abdomen soft and nontender. No organomegaly. Ventral hernia. Vascular: Trace bilateral pretibial edema.  Skin: 1 cm scabbed wound with surrounding erythema left lower leg.    Lab Results:  Lab Results  Component Value Date   WBC 7.7 03/19/2014   HGB 14.9 03/19/2014   HCT 44.8 03/19/2014   MCV 93.0 03/19/2014   PLT 210 03/19/2014   NEUTROABS 4.1 03/19/2014    Imaging:  No results found.  Medications: I have reviewed the patient's current medications.  Assessment/Plan: 1. 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 The tumor returned microsatellite stable with no loss of mismatch repair protein expression.  Cycle 1 adjuvant CAPOX beginning 09/18/2013.  Cycle 7 CAPOX 01/22/2014  Cycle 8 CAPOX 02/12/2014 (oxaliplatin deleted). 2. Port-A-Cath placement 09/16/2013. 3. Gout. 4. History of anxiety maintained on  Tegretol. 5. Hypertension. 6. Delayed nausea. Improved with Aloxi/Emend 7. Rash-the rash occurred following cycle 7 CAPOX. It is possible the rash is related to capecitabine. Improved. 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 9. Left lower leg wound.   Disposition: He appears stable. He has completed the course of adjuvant chemotherapy. We will follow-up on the CEA from today.  We made a referral to Dr. Marcello Moores for Port-A-Cath removal as well as to schedule a one year colonoscopy in October 2015.   Dr. Benay Spice recommends CT scans of the chest/abdomen/pelvis at a one-year interval which will be in October of this year. Mr. Welter will return for a followup visit approximately 1 week after the scans to review the results.  For the wound at the left lower leg we prescribed a 10 day course of doxycycline. He will followup with his primary care Jeremyah Jelley if the wound worsens or does not heal with the antibiotic.  Mr. Macleod will contact the office prior to his next visit with any problems.  Patient seen with Dr. Benay Spice.    Ned Card ANP/GNP-BC   03/19/2014  4:34 PM This was a shared visit with Ned Card. Mr. [place] has completed adjuvant chemotherapy. We will followup on the CEA from today. He will return for an office visit and restaging CT evaluation in October. Hopefully the neuropathy symptoms will improve over the next few months.  Julieanne Manson, M.D.

## 2014-03-19 NOTE — Patient Instructions (Signed)
Implanted Port Home Guide  An implanted port is a type of central line that is placed under the skin. Central lines are used to provide IV access when treatment or nutrition needs to be given through a person's veins. Implanted ports are used for long-term IV access. An implanted port may be placed because:    You need IV medicine that would be irritating to the small veins in your hands or arms.    You need long-term IV medicines, such as antibiotics.    You need IV nutrition for a long period.    You need frequent blood draws for lab tests.    You need dialysis.   Implanted ports are usually placed in the chest area, but they can also be placed in the upper arm, the abdomen, or the leg. An implanted port has two main parts:    Reservoir. The reservoir is round and will appear as a small, raised area under your skin. The reservoir is the part where a needle is inserted to give medicines or draw blood.    Catheter. The catheter is a thin, flexible tube that extends from the reservoir. The catheter is placed into a large vein. Medicine that is inserted into the reservoir goes into the catheter and then into the vein.   HOW WILL I CARE FOR MY INCISION SITE?  Do not get the incision site wet. Bathe or shower as directed by your health care Miguel Christiana.   HOW IS MY PORT ACCESSED?  Special steps must be taken to access the port:    Before the port is accessed, a numbing cream can be placed on the skin. This helps numb the skin over the port site.    Your health care Braelin Brosch uses a sterile technique to access the port.   Your health care Ryliee Figge must put on a mask and sterile gloves.   The skin over your port is cleaned carefully with an antiseptic and allowed to dry.   The port is gently pinched between sterile gloves, and a needle is inserted into the port.   Only "non-coring" port needles should be used to access the port. Once the port is accessed, a blood return should be checked. This helps  ensure that the port is in the vein and is not clogged.    If your port needs to remain accessed for a constant infusion, a clear (transparent) bandage will be placed over the needle site. The bandage and needle will need to be changed every week, or as directed by your health care Ayomide Purdy.    Keep the bandage covering the needle clean and dry. Do not get it wet. Follow your health care Lukis Bunt's instructions on how to take a shower or bath while the port is accessed.    If your port does not need to stay accessed, no bandage is needed over the port.   WHAT IS FLUSHING?  Flushing helps keep the port from getting clogged. Follow your health care Diyari Cherne's instructions on how and when to flush the port. Ports are usually flushed with saline solution or a medicine called heparin. The need for flushing will depend on how the port is used.    If the port is used for intermittent medicines or blood draws, the port will need to be flushed:    After medicines have been given.    After blood has been drawn.    As part of routine maintenance.    If a constant infusion is   running, the port may not need to be flushed.   HOW LONG WILL MY PORT STAY IMPLANTED?  The port can stay in for as long as your health care Rily Nickey thinks it is needed. When it is time for the port to come out, surgery will be done to remove it. The procedure is similar to the one performed when the port was put in.   WHEN SHOULD I SEEK IMMEDIATE MEDICAL CARE?  When you have an implanted port, you should seek immediate medical care if:    You notice a bad smell coming from the incision site.    You have swelling, redness, or drainage at the incision site.    You have more swelling or pain at the port site or the surrounding area.    You have a fever that is not controlled with medicine.  Document Released: 09/19/2005 Document Revised: 07/10/2013 Document Reviewed: 05/27/2013  [employer] Patient Information 2015 [employer]. This  information is not intended to replace advice given to you by your health care Xaiden Fleig. Make sure you discuss any questions you have with your health care Javonda Suh.

## 2014-03-20 ENCOUNTER — Telehealth (INDEPENDENT_AMBULATORY_CARE_PROVIDER_SITE_OTHER): Payer: Self-pay

## 2014-03-20 ENCOUNTER — Telehealth: Payer: Self-pay | Admitting: *Deleted

## 2014-03-20 LAB — CEA: CEA: 2.2 ng/mL (ref 0.0–5.0)

## 2014-03-20 NOTE — Telephone Encounter (Signed)
LMOM for pt to cb. Please advise that he has appt w/ Dr. [name] 0/86 @ 5:78IO. This is to remove port per Dr. Ned Card.

## 2014-03-20 NOTE — Telephone Encounter (Signed)
Message copied by Norma Fredrickson on Thu Mar 20, 2014 12:25 PM ------      Message from: Betsy Coder B      Created: Thu Mar 20, 2014 11:46 AM       Please call patient, cea is normal ------

## 2014-03-20 NOTE — Telephone Encounter (Signed)
Called and informed patient of normal cea.  Per Dr. Sherrill.  Patient verbalized understanding.  

## 2014-03-26 ENCOUNTER — Encounter (INDEPENDENT_AMBULATORY_CARE_PROVIDER_SITE_OTHER): Payer: Federal, State, Local not specified - PPO | Admitting: General Surgery

## 2014-04-16 ENCOUNTER — Encounter (INDEPENDENT_AMBULATORY_CARE_PROVIDER_SITE_OTHER): Payer: Self-pay | Admitting: General Surgery

## 2014-04-16 ENCOUNTER — Ambulatory Visit (INDEPENDENT_AMBULATORY_CARE_PROVIDER_SITE_OTHER): Payer: Federal, State, Local not specified - PPO | Admitting: General Surgery

## 2014-04-16 VITALS — BP 128/74 | HR 81 | Temp 97.0°F | Ht 70.0 in | Wt 295.0 lb

## 2014-04-16 DIAGNOSIS — Z95828 Presence of other vascular implants and grafts: Secondary | ICD-10-CM

## 2014-04-16 DIAGNOSIS — Z9889 Other specified postprocedural states: Secondary | ICD-10-CM

## 2014-04-16 NOTE — Progress Notes (Signed)
John Hubbard is a 53 y.o. male who is status post a port placement for stage 3 colon cancer. He is doing well after port placement. He has completed his chemotherapy. He seems to have done well with this. He is having regular bowel movements and minimal constipation. He denies any bleeding with bowel movements.  Objective:  Filed Vitals:   04/16/14 1643  BP: 128/74  Pulse: 81  Temp: 97 F (36.1 C)    General appearance: alert and cooperative  GI: normal findings: soft, non-tender  Incision: healing well   Assessment:  s/p  Patient Active Problem List    Diagnosis  Date Noted   .  Colon cancer  07/31/2013    Plan:  Patient is done with chemo and ready to have port removed.  We will schedule this at his convenience.  We discussed risks of bleeding and infection.

## 2014-05-08 ENCOUNTER — Encounter (HOSPITAL_COMMUNITY): Payer: Self-pay | Admitting: Pharmacy Technician

## 2014-05-08 NOTE — Patient Instructions (Addendum)
AXAVIER PRESSLEY  05/08/2014                           YOUR PROCEDURE IS SCHEDULED ON: 05/14/14               ENTER THRU [facility] MAIN [facility] ENTRANCE AND                           FOLLOW  SIGNS TO SHORT STAY CENTER                 ARRIVE AT SHORT STAY AT: 11:30 AM               CALL THIS NUMBER IF ANY PROBLEMS THE DAY OF SURGERY :               [phone]                                REMEMBER:   Do not eat food AFTER MIDNIGHT              MAY HAVE CLEAR LIQUIDS UNTIL 7:30 AM                  Take these medicines the morning of surgery with               A SIPS OF WATER :   none      Do not wear jewelry, make-up   Do not wear lotions, powders, or perfumes.   Do not shave legs or underarms 12 hrs. before surgery (men may shave face)  Do not bring valuables to the hospital.  Contacts, dentures or bridgework may not be worn into surgery.  Leave suitcase in the car. After surgery it may be brought to your room.  For patients admitted to the hospital more than one night, checkout time is            11:00 AM                                                       The day of discharge.   Patients discharged the day of surgery will not be allowed to drive home.            If going home same day of surgery, must have someone stay with you              FIRST 24 hrs at home and arrange for some one to drive you              home from hospital.   ________________________________________________________________________                                                                        [place]  Before surgery, you can play an important role.  Because skin is not sterile, your skin needs to be as free of  germs as possible.  You can reduce the number of germs on your skin by washing with CHG (chlorahexidine gluconate) soap before surgery.  CHG is an antiseptic cleaner which kills germs and bonds with the skin to continue killing germs even  after washing. Please DO NOT use if you have an allergy to CHG or antibacterial soaps.  If your skin becomes reddened/irritated stop using the CHG and inform your nurse when you arrive at Short Stay. Do not shave (including legs and underarms) for at least 48 hours prior to the first CHG shower.  You may shave your face. Please follow these instructions carefully:   1.  Shower with CHG Soap the night before surgery and the  morning of Surgery.   2.  If you choose to wash your hair, wash your hair first as usual with your  normal  Shampoo.   3.  After you shampoo, rinse your hair and body thoroughly to remove the  shampoo.                                         4.  Use CHG as you would any other liquid soap.  You can apply chg directly  to the skin and wash . Gently wash with scrungie or clean wascloth    5.  Apply the CHG Soap to your body ONLY FROM THE NECK DOWN.   Do not use on open                           Wound or open sores. Avoid contact with eyes, ears mouth and genitals (private parts).                        Genitals (private parts) with your normal soap.              6.  Wash thoroughly, paying special attention to the area where your surgery  will be performed.   7.  Thoroughly rinse your body with warm water from the neck down.   8.  DO NOT shower/wash with your normal soap after using and rinsing off  the CHG Soap .                9.  Pat yourself dry with a clean towel.             10.  Wear clean pajamas.             11.  Place clean sheets on your bed the night of your first shower and do not  sleep with pets.  Day of Surgery : Do not apply any lotions/deodorants the morning of surgery.  Please wear clean clothes to the hospital/surgery center.  FAILURE TO FOLLOW THESE INSTRUCTIONS MAY RESULT IN THE CANCELLATION OF YOUR SURGERY    PATIENT  SIGNATURE_________________________________  ______________________________________________________________________      CLEAR LIQUID DIET   Foods Allowed                                                                     Foods Excluded  Coffee and tea, regular and decaf                             liquids that you cannot  Plain Jell-O in any flavor                                             see through such as: Fruit ices (not with fruit pulp)                                     milk, soups, orange juice  Iced Popsicles                                    All solid food Carbonated beverages, regular and diet                                    Cranberry, grape and apple juices Sports drinks like Gatorade Lightly seasoned clear broth or consume(fat free) Sugar, honey syrup  Sample Menu Breakfast                                Lunch                                     Supper Cranberry juice                    Beef broth                            Chicken broth Jell-O                                     Grape juice                           Apple juice Coffee or tea                        Jell-O                                      Popsicle                                                Coffee or tea                        Coffee or tea  _____________________________________________________________________

## 2014-05-09 ENCOUNTER — Encounter (HOSPITAL_COMMUNITY): Payer: Self-pay

## 2014-05-09 ENCOUNTER — Encounter (HOSPITAL_COMMUNITY)
Admission: RE | Admit: 2014-05-09 | Discharge: 2014-05-09 | Disposition: A | Discharge status: Home / Self Care | Payer: Federal, State, Local not specified - PPO | Source: Ambulatory Visit | Attending: General Surgery | Admitting: General Surgery

## 2014-05-09 DIAGNOSIS — Z01818 Encounter for other preprocedural examination: Secondary | ICD-10-CM | POA: Insufficient documentation

## 2014-05-09 DIAGNOSIS — Z01812 Encounter for preprocedural laboratory examination: Secondary | ICD-10-CM | POA: Insufficient documentation

## 2014-05-09 NOTE — Progress Notes (Signed)
05/09/14 0824  OBSTRUCTIVE SLEEP APNEA  Have you ever been diagnosed with sleep apnea through a sleep study? No  Do you snore loudly (loud enough to be heard through closed doors)?  1  Do you often feel tired, fatigued, or sleepy during the daytime? 1  Has anyone observed you stop breathing during your sleep? 1  Do you have, or are you being treated for high blood pressure? 0  BMI more than 35 kg/m2? 1  Age over 55 years old? 1  Neck circumference greater than 40 cm/16 inches? 1  Gender: 1  Obstructive Sleep Apnea Score 7  Score 4 or greater  Results sent to PCP

## 2014-05-14 ENCOUNTER — Encounter (HOSPITAL_COMMUNITY)
Admission: RE | Disposition: A | Discharge status: Home / Self Care | Payer: Self-pay | Source: Ambulatory Visit | Attending: General Surgery

## 2014-05-14 ENCOUNTER — Encounter (HOSPITAL_COMMUNITY): Payer: Self-pay | Admitting: *Deleted

## 2014-05-14 ENCOUNTER — Ambulatory Visit (HOSPITAL_COMMUNITY): Payer: Federal, State, Local not specified - PPO | Admitting: Anesthesiology

## 2014-05-14 ENCOUNTER — Encounter (HOSPITAL_COMMUNITY): Payer: Federal, State, Local not specified - PPO | Admitting: Anesthesiology

## 2014-05-14 ENCOUNTER — Ambulatory Visit (HOSPITAL_COMMUNITY)
Admission: RE | Admit: 2014-05-14 | Discharge: 2014-05-14 | Disposition: A | Discharge status: Home / Self Care | Payer: Federal, State, Local not specified - PPO | Source: Ambulatory Visit | Attending: General Surgery | Admitting: General Surgery

## 2014-05-14 DIAGNOSIS — I10 Essential (primary) hypertension: Secondary | ICD-10-CM | POA: Diagnosis not present

## 2014-05-14 DIAGNOSIS — Z9221 Personal history of antineoplastic chemotherapy: Secondary | ICD-10-CM | POA: Insufficient documentation

## 2014-05-14 DIAGNOSIS — C189 Malignant neoplasm of colon, unspecified: Secondary | ICD-10-CM | POA: Insufficient documentation

## 2014-05-14 DIAGNOSIS — F411 Generalized anxiety disorder: Secondary | ICD-10-CM | POA: Diagnosis not present

## 2014-05-14 DIAGNOSIS — M109 Gout, unspecified: Secondary | ICD-10-CM | POA: Diagnosis not present

## 2014-05-14 DIAGNOSIS — Z452 Encounter for adjustment and management of vascular access device: Secondary | ICD-10-CM | POA: Insufficient documentation

## 2014-05-14 DIAGNOSIS — R209 Unspecified disturbances of skin sensation: Secondary | ICD-10-CM | POA: Diagnosis not present

## 2014-05-14 HISTORY — PX: PORT-A-CATH REMOVAL: SHX5289

## 2014-05-14 SURGERY — REMOVAL PORT-A-CATH
Anesthesia: Monitor Anesthesia Care | Site: Chest

## 2014-05-14 MED ORDER — MIDAZOLAM HCL 2 MG/2ML IJ SOLN
INTRAMUSCULAR | Status: AC
  Filled 2014-05-14: qty 2

## 2014-05-14 MED ORDER — FENTANYL CITRATE 0.05 MG/ML IJ SOLN
INTRAMUSCULAR | Status: AC
  Filled 2014-05-14: qty 2

## 2014-05-14 MED ORDER — PROPOFOL 10 MG/ML IV BOLUS
INTRAVENOUS | Status: AC
  Filled 2014-05-14: qty 20

## 2014-05-14 MED ORDER — SODIUM CHLORIDE 0.9 % IJ SOLN: 3.0000 mL | INTRAMUSCULAR | Status: DC | PRN

## 2014-05-14 MED ORDER — CLINDAMYCIN PHOSPHATE 600 MG/50ML IV SOLN
600.0000 mg | INTRAVENOUS | Status: DC
  Filled 2014-05-14: qty 50

## 2014-05-14 MED ORDER — PROPOFOL INFUSION 10 MG/ML OPTIME
INTRAVENOUS | Status: DC | PRN
  Administered 2014-05-14: 140 ug/kg/min via INTRAVENOUS

## 2014-05-14 MED ORDER — LIDOCAINE HCL (CARDIAC) 20 MG/ML IV SOLN
INTRAVENOUS | Status: AC
  Filled 2014-05-14: qty 5

## 2014-05-14 MED ORDER — FENTANYL CITRATE 0.05 MG/ML IJ SOLN: 25.0000 ug | INTRAMUSCULAR | Status: DC | PRN

## 2014-05-14 MED ORDER — ACETAMINOPHEN 325 MG PO TABS: 650.0000 mg | ORAL_TABLET | ORAL | Status: DC | PRN

## 2014-05-14 MED ORDER — BUPIVACAINE-EPINEPHRINE (PF) 0.25% -1:200000 IJ SOLN
INTRAMUSCULAR | Status: AC
  Filled 2014-05-14: qty 30

## 2014-05-14 MED ORDER — LACTATED RINGERS IV SOLN: INTRAVENOUS | Status: DC

## 2014-05-14 MED ORDER — BUPIVACAINE-EPINEPHRINE 0.25% -1:200000 IJ SOLN
INTRAMUSCULAR | Status: DC | PRN
  Administered 2014-05-14: 9 mL

## 2014-05-14 MED ORDER — LACTATED RINGERS IV SOLN
INTRAVENOUS | Status: DC
  Administered 2014-05-14: 1000 mL via INTRAVENOUS

## 2014-05-14 MED ORDER — 0.9 % SODIUM CHLORIDE (POUR BTL) OPTIME
TOPICAL | Status: DC | PRN
  Administered 2014-05-14: 1000 mL

## 2014-05-14 MED ORDER — ACETAMINOPHEN 650 MG RE SUPP: 650.0000 mg | RECTAL | Status: DC | PRN

## 2014-05-14 MED ORDER — SODIUM CHLORIDE 0.9 % IV SOLN: 250.0000 mL | INTRAVENOUS | Status: DC | PRN

## 2014-05-14 MED ORDER — FENTANYL CITRATE 0.05 MG/ML IJ SOLN
INTRAMUSCULAR | Status: DC | PRN
  Administered 2014-05-14: 100 ug via INTRAVENOUS

## 2014-05-14 MED ORDER — OXYCODONE HCL 5 MG PO TABS: 5.0000 mg | ORAL_TABLET | ORAL | Status: DC | PRN

## 2014-05-14 MED ORDER — SODIUM CHLORIDE 0.9 % IJ SOLN: 3.0000 mL | Freq: Two times a day (BID) | INTRAMUSCULAR | Status: DC

## 2014-05-14 MED ORDER — MIDAZOLAM HCL 5 MG/5ML IJ SOLN
INTRAMUSCULAR | Status: DC | PRN
  Administered 2014-05-14: 2 mg via INTRAVENOUS

## 2014-05-14 SURGICAL SUPPLY — 29 items
BLADE HEX COATED 2.75 (ELECTRODE) ×2 IMPLANT
BLADE SURG 15 STRL LF DISP TIS (BLADE) ×1 IMPLANT
BLADE SURG 15 STRL SS (BLADE) ×1
CANISTER SUCTION 2500CC (MISCELLANEOUS) ×2 IMPLANT
DECANTER SPIKE VIAL GLASS SM (MISCELLANEOUS) ×2 IMPLANT
DERMABOND ADVANCED (GAUZE/BANDAGES/DRESSINGS) ×1
DERMABOND ADVANCED .7 DNX12 (GAUZE/BANDAGES/DRESSINGS) ×1 IMPLANT
DRAPE LAPAROTOMY TRNSV 102X78 (DRAPE) ×2 IMPLANT
ELECT REM PT RETURN 9FT ADLT (ELECTROSURGICAL) ×2
ELECTRODE REM PT RTRN 9FT ADLT (ELECTROSURGICAL) ×1 IMPLANT
GAUZE SPONGE 4X4 12PLY STRL (GAUZE/BANDAGES/DRESSINGS) IMPLANT
GLOVE BIO SURGEON STRL SZ 6.5 (GLOVE) ×2 IMPLANT
GLOVE BIOGEL PI IND STRL 7.0 (GLOVE) ×1 IMPLANT
GLOVE BIOGEL PI INDICATOR 7.0 (GLOVE) ×1
GOWN L4 XXLG W/PAP TWL (GOWN DISPOSABLE) ×2 IMPLANT
GOWN SPEC L4 XLG W/TWL (GOWN DISPOSABLE) ×4 IMPLANT
KIT BASIN OR (CUSTOM PROCEDURE TRAY) ×2 IMPLANT
NEEDLE HYPO 25X1 1.5 SAFETY (NEEDLE) ×2 IMPLANT
PACK BASIC VI WITH GOWN DISP (CUSTOM PROCEDURE TRAY) ×2 IMPLANT
PENCIL BUTTON HOLSTER BLD 10FT (ELECTRODE) ×2 IMPLANT
SPONGE LAP 4X18 X RAY DECT (DISPOSABLE) ×2 IMPLANT
SUT MNCRL AB 4-0 PS2 18 (SUTURE) ×2 IMPLANT
SUT VIC AB 2-0 SH 18 (SUTURE) ×2 IMPLANT
SUT VIC AB 3-0 SH 18 (SUTURE) IMPLANT
SUT VIC AB 4-0 PS2 27 (SUTURE) ×2 IMPLANT
SYR CONTROL 10ML LL (SYRINGE) ×2 IMPLANT
TOWEL OR 17X26 10 PK STRL BLUE (TOWEL DISPOSABLE) ×2 IMPLANT
TOWEL OR NON WOVEN STRL DISP B (DISPOSABLE) ×2 IMPLANT
YANKAUER SUCT BULB TIP 10FT TU (MISCELLANEOUS) IMPLANT

## 2014-05-14 NOTE — Op Note (Signed)
05/14/2014  4:01 PM  PATIENT:  John Hubbard  53 y.o. male  Patient Care Team: Tamsen Roers, MD as PCP - General (Family Medicine)  PRE-OPERATIVE DIAGNOSIS:  colon cancer stage 3  POST-OPERATIVE DIAGNOSIS:  colon cancer stage 3  PROCEDURE:  Procedure(s): REMOVAL PORT-A-CATH  SURGEON:  Surgeon(s): [name], MD  ASSISTANT: none   ANESTHESIA:   MAC  EBL:  Total I/O In: 500 [I.V.:500] Out: 5 [Blood:5]  DRAINS: none   SPECIMEN:  Source of Specimen:  port  DISPOSITION OF SPECIMEN:  PATHOLOGY  COUNTS:  YES  PLAN OF CARE: Discharge to home after PACU  PATIENT DISPOSITION:  PACU - hemodynamically stable.  INDICATION: This is a 53 y.o. M with colon cancer that has completed his chemotherapy and is ready for port removal.   OR FINDINGS: normal appearing port  DESCRIPTION: the patient was identified in the preoperative holding area and taken to the OR where they were laid supine on the operating room table.  MAC anesthesia was induced without difficulty. SCDs were also noted to be in place prior to the initiation of anesthesia.  The patient was then prepped and draped in the usual sterile fashion.  A surgical timeout was performed indicating the correct patient, procedure, positioning and need for preoperative antibiotics.   A field block was performed using Marcaine 0.25% with epinephrine.  I excised the old scar.  I removed the port sutures.  The port was pulled out of the pocket.  The catheter slid out without resistance.  I placed him in reverse trendelenburg and placed a 2-0 Vicryl pursestring around the port entry site.  The remaining catheter was pulled and the pursestring was tied.  The port and catheter were intact.  He was placed back flat.  Hemostasis was good.  The cavity was irrigated and closed using interrupted 2-0 Vicryl sutures.  The skin was closed using a running 4-0 Vicryl suture and dermabond.  All counts were correct per OR staff.  The patient was awakened  from anesthesia and sent to the PACU in stable condition.

## 2014-05-14 NOTE — Anesthesia Preprocedure Evaluation (Addendum)
Anesthesia Evaluation  Patient identified by MRN, date of birth, ID band Patient awake    Reviewed: Allergy & Precautions, H&P , NPO status , Patient's Chart, lab work & pertinent test results  Airway Mallampati: III TM Distance: >3 FB Neck ROM: full    Dental no notable dental hx. (+) Teeth Intact, Dental Advisory Given   Pulmonary neg pulmonary ROS,  breath sounds clear to auscultation  Pulmonary exam normal       Cardiovascular Exercise Tolerance: Good hypertension, negative cardio ROS  Rhythm:regular Rate:Normal     Neuro/Psych Anxiety Bipolar Disorder negative neurological ROS  negative psych ROS   GI/Hepatic negative GI ROS, Neg liver ROS, Colon Ca   Endo/Other  negative endocrine ROSMorbid obesity  Renal/GU negative Renal ROS  negative genitourinary   Musculoskeletal   Abdominal (+) + obese,   Peds  Hematology negative hematology ROS (+)   Anesthesia Other Findings   Reproductive/Obstetrics negative OB ROS                        Anesthesia Physical Anesthesia Plan  ASA: III  Anesthesia Plan: MAC   Post-op Pain Management:    Induction:   Airway Management Planned: Simple Face Mask  Additional Equipment:   Intra-op Plan:   Post-operative Plan:   Informed Consent: I have reviewed the patients History and Physical, chart, labs and discussed the procedure including the risks, benefits and alternatives for the proposed anesthesia with the patient or authorized representative who has indicated his/her understanding and acceptance.   Dental Advisory Given  Plan Discussed with: CRNA and Surgeon  Anesthesia Plan Comments:       Anesthesia Quick Evaluation

## 2014-05-14 NOTE — Transfer of Care (Signed)
Immediate Anesthesia Transfer of Care Note  Patient: John Hubbard  Procedure(s) Performed: Procedure(s): REMOVAL PORT-A-CATH (N/A)  Patient Location: PACU  Anesthesia Type:MAC  Level of Consciousness: awake, alert , oriented and patient cooperative  Airway & Oxygen Therapy: Patient Spontanous Breathing and Patient connected to face mask oxygen  Post-op Assessment: Report given to PACU RN, Post -op Vital signs reviewed and stable and Patient moving all extremities  Post vital signs: Reviewed and stable  Complications: No apparent anesthesia complications

## 2014-05-14 NOTE — H&P (Signed)
John Hubbard is a 53 y.o. male who is status post a port placement for stage 3 colon cancer. He has done well after port placement. He has completed his chemotherapy.Marland Kitchen He is having regular bowel movements and minimal constipation. He denies any bleeding with bowel movements.  Past Medical History  Diagnosis Date  . Anxiety   . Gout   . Hypertension     no meds   . Numbness and tingling in hands   . History of gout   . History of colon cancer   . History of bipolar disorder   . Cancer     hx colon cancer   Past Surgical History  Procedure Laterality Date  . Hernia repair  9/73/53    Umbilical Hernia Repair  . Colonoscopy Bilateral 07/24/2013    Procedure: COLONOSCOPY;  Surgeon: [name], MD;  Location: [facility];  Service: Endoscopy;  Laterality: Bilateral;  . Laparoscopic partial colectomy N/A 08/14/2013    Procedure: LAPAROSCOPIC PARTIAL COLECTOMY wound vac placement;  Surgeon: [name], MD;  Location: [facility];  Service: General;  Laterality: N/A;  . Portacath placement Left 09/16/2013    Procedure: INSERTION PORT-A-CATH;  Surgeon: [name], MD;  Location: [facility];  Service: General;  Laterality: Left;  dressing change on lower abdominal wound   History reviewed. No pertinent family history. History   Social History  . Marital Status: Married    Spouse Name: N/A    Number of Children: N/A  . Years of Education: N/A   Occupational History  . Not on file.   Social History Main Topics  . Smoking status: Never Smoker   . Smokeless tobacco: Never Used  . Alcohol Use: Yes     Comment: rare  . Drug Use: No  . Sexual Activity: Not on file   Other Topics Concern  . Not on file   Social History Narrative  . No narrative on file   Allergies  Allergen Reactions  . Penicillins Hives   Scheduled Meds: . clindamycin (CLEOCIN) IV  600 mg Intravenous On Call to OR   Continuous Infusions: . lactated ringers 1,000 mL (05/14/14 1347)    Review of Systems - General ROS: negative for - chills or fever Respiratory ROS: no cough, shortness of breath, or wheezing Cardiovascular ROS: no chest pain or dyspnea on exertion Gastrointestinal ROS: no abdominal pain, change in bowel habits, or black or bloody stools  BP 149/88  Pulse 77  Temp(Src) 98 F (36.7 C) (Oral)  Resp 20  Ht 5\' 10"  (1.778 m)  Wt 299 lb (135.626 kg)  BMI 42.90 kg/m2  SpO2 97%  Physical Exam  Constitutional: He is oriented to person, place, and time and well-developed, well-nourished, and in no distress.  HENT:  Head: Normocephalic and atraumatic.  Eyes: Pupils are equal, round, and reactive to light.  Neck: Normal range of motion.  Cardiovascular: Normal rate and regular rhythm.   Pulmonary/Chest: Effort normal and breath sounds normal.  Abdominal: Soft. Bowel sounds are normal.  Musculoskeletal: Normal range of motion.  Neurological: He is alert and oriented to person, place, and time.  Skin: Skin is warm and dry.     Assessment:  s/p port placement Patient Active Problem List    Diagnosis  Date Noted   .  Colon cancer  07/31/2013   Plan:  Patient is done with chemo and ready to have port removed. We will schedule this at his convenience. We discussed risks, which include bleeding  and infection.

## 2014-05-14 NOTE — Discharge Instructions (Signed)
PORT-A-CATH: POST OP INSTRUCTIONS  Always review your discharge instruction sheet given to you by the facility where your surgery was performed.   1. You make take acetaminophen (Tylenol) or ibuprofen (Advil) as needed.  2. Take your usually prescribed medications unless otherwise directed. 3. If you need a prescription for your pain medication, please contact our office. All narcotic pain medicine now requires a paper prescription.  Phoned in and fax refills are no longer allowed by law.  Prescriptions will not be filled after 5 pm or on weekends.  4. You should follow a light diet for the remainder of the day after your procedure. 5. Most patients will experience some mild swelling and/or bruising in the area of the incision. It may take several days to resolve. 6. It is common to experience some constipation if taking pain medication after surgery. Increasing fluid intake and taking a stool softener (such as Colace) will usually help or prevent this problem from occurring. A mild laxative (Milk of Magnesia or Miralax) should be taken according to package directions if there are no bowel movements after 48 hours.  7. Unless discharge instructions indicate otherwise, you may remove your bandages 48 hours after surgery, and you may shower at that time. You may have steri-strips (small white skin tapes) in place directly over the incision.  These strips should be left on the skin for 7-10 days.  If your surgeon used Dermabond (skin glue) on the incision, you may shower in 24 hours.  The glue will flake off over the next 2-3 weeks.  8. ACTIVITIES:  Limit activity involving your arms for the next 72 hours. Do no strenuous exercise or activity for 1 week. You may drive when you are no longer taking prescription pain medication, you can comfortably wear a seatbelt, and you can maneuver your car. 10.You may need to see your doctor in the office for a follow-up appointment.  Please       check with your  doctor.    WHEN TO CALL YOUR DOCTOR [phone]): 1. Fever over 101.0 2. Chills 3. Continued bleeding from incision 4. Increased redness and tenderness at the site 5. Shortness of breath, difficulty breathing   The clinic staff is available to answer your questions during regular business hours. Please dont hesitate to call and ask to speak to one of the nurses or medical assistants for clinical concerns. If you have a medical emergency, go to the nearest emergency room or call 911.  A surgeon from [facility] is always on call at the hospital.     For further information, please visit [email or web address]

## 2014-05-14 NOTE — Anesthesia Postprocedure Evaluation (Signed)
  Anesthesia Post-op Note  Patient: John Hubbard  Procedure(s) Performed: Procedure(s) (LRB): REMOVAL PORT-A-CATH (N/A)  Patient Location: PACU  Anesthesia Type: MAC  Level of Consciousness: awake and alert   Airway and Oxygen Therapy: Patient Spontanous Breathing  Post-op Pain: mild  Post-op Assessment: Post-op Vital signs reviewed, Patient's Cardiovascular Status Stable, Respiratory Function Stable, Patent Airway and No signs of Nausea or vomiting  Last Vitals:  Filed Vitals:   05/14/14 1513  BP: 146/91  Pulse: 70  Temp: 36.7 C  Resp: 18    Post-op Vital Signs: stable   Complications: No apparent anesthesia complications

## 2014-05-15 ENCOUNTER — Encounter (HOSPITAL_COMMUNITY): Payer: Self-pay | Admitting: General Surgery

## 2014-07-11 ENCOUNTER — Telehealth: Payer: Self-pay | Admitting: Oncology

## 2014-07-11 NOTE — Telephone Encounter (Signed)
called pt and advicalled pt and advised on MD nxt available...advised pt to call back to r/ssed on MD nxt available...advised pt to call back to r/s

## 2014-07-14 ENCOUNTER — Telehealth: Payer: Self-pay | Admitting: Oncology

## 2014-07-14 ENCOUNTER — Ambulatory Visit (HOSPITAL_COMMUNITY): Payer: Federal, State, Local not specified - PPO

## 2014-07-14 ENCOUNTER — Other Ambulatory Visit: Payer: Federal, State, Local not specified - PPO

## 2014-07-14 NOTE — Telephone Encounter (Signed)
reutned pt call re r/s appt. lmonvm for pt asking for him to call back to r/s.

## 2014-07-18 ENCOUNTER — Telehealth: Payer: Self-pay | Admitting: Oncology

## 2014-07-18 NOTE — Telephone Encounter (Signed)
returned pt call and r/s appt per pt request....pt ok and aware of new d.t °

## 2014-07-21 ENCOUNTER — Ambulatory Visit: Payer: Federal, State, Local not specified - PPO | Admitting: Oncology

## 2014-07-23 ENCOUNTER — Ambulatory Visit (HOSPITAL_COMMUNITY): Payer: Federal, State, Local not specified - PPO

## 2014-07-23 ENCOUNTER — Other Ambulatory Visit: Payer: Federal, State, Local not specified - PPO

## 2014-07-24 ENCOUNTER — Telehealth: Payer: Self-pay | Admitting: *Deleted

## 2014-07-24 NOTE — Telephone Encounter (Signed)
Called patient to follow up on [place] for his CT chest on 10/21. He confirms he needs to reschedule this and his labs. Provided him with phone # for central scheduling for radiology to call and reschedule. Call scheduler after this to change his lab appointment. He understands and agrees.

## 2014-07-30 ENCOUNTER — Ambulatory Visit: Payer: Federal, State, Local not specified - PPO | Admitting: Nurse Practitioner

## 2014-08-01 ENCOUNTER — Ambulatory Visit (HOSPITAL_COMMUNITY): Payer: Federal, State, Local not specified - PPO

## 2014-08-01 ENCOUNTER — Telehealth: Payer: Self-pay | Admitting: *Deleted

## 2014-08-01 NOTE — Telephone Encounter (Signed)
Call from [facility] in scheduling to report pt called to reschedule his office visit, need a slot. Also noted CT has not been rescheduled.  Left message on voicemail for pt to call office.

## 2014-08-05 ENCOUNTER — Telehealth: Payer: Self-pay | Admitting: Nurse Practitioner

## 2014-08-05 ENCOUNTER — Telehealth: Payer: Self-pay | Admitting: *Deleted

## 2014-08-05 NOTE — Telephone Encounter (Signed)
Lft msg for pt confirming labs/ov per 11/03 POF, advised pt that he would need to call Radiology to r/s his CT scan following his labs.... KJ

## 2014-08-05 NOTE — Telephone Encounter (Signed)
Call from pt to reschedule office visit. Requests late afternoon appt. Declined 11/9 appt. Pt given appt for 11/17 at 2:45. He stated he will call radiology to reschedule scan.

## 2014-08-11 ENCOUNTER — Telehealth: Payer: Self-pay | Admitting: *Deleted

## 2014-08-11 NOTE — Telephone Encounter (Signed)
Message from pt reporting he was told he could not reschedule CT scan. Spoke with Vivien Rota, Radiology scheduler, she will contact pt to schedule.

## 2014-08-15 ENCOUNTER — Other Ambulatory Visit (HOSPITAL_BASED_OUTPATIENT_CLINIC_OR_DEPARTMENT_OTHER): Payer: Federal, State, Local not specified - PPO

## 2014-08-15 ENCOUNTER — Encounter (HOSPITAL_COMMUNITY): Payer: Self-pay

## 2014-08-15 ENCOUNTER — Ambulatory Visit (HOSPITAL_COMMUNITY)
Admission: RE | Admit: 2014-08-15 | Discharge: 2014-08-15 | Disposition: A | Discharge status: Home / Self Care | Payer: Federal, State, Local not specified - PPO | Source: Ambulatory Visit | Attending: Nurse Practitioner | Admitting: Nurse Practitioner

## 2014-08-15 DIAGNOSIS — Z9221 Personal history of antineoplastic chemotherapy: Secondary | ICD-10-CM | POA: Insufficient documentation

## 2014-08-15 DIAGNOSIS — C189 Malignant neoplasm of colon, unspecified: Secondary | ICD-10-CM

## 2014-08-15 DIAGNOSIS — Z9049 Acquired absence of other specified parts of digestive tract: Secondary | ICD-10-CM | POA: Insufficient documentation

## 2014-08-15 DIAGNOSIS — C187 Malignant neoplasm of sigmoid colon: Secondary | ICD-10-CM

## 2014-08-15 LAB — COMPREHENSIVE METABOLIC PANEL (CC13)
ALBUMIN: 4.1 g/dL (ref 3.5–5.0)
ALT: 32 U/L (ref 0–55)
AST: 23 U/L (ref 5–34)
Alkaline Phosphatase: 122 U/L (ref 40–150)
Anion Gap: 9 mEq/L (ref 3–11)
BUN: 19.5 mg/dL (ref 7.0–26.0)
CALCIUM: 9.6 mg/dL (ref 8.4–10.4)
CHLORIDE: 107 meq/L (ref 98–109)
CO2: 26 mEq/L (ref 22–29)
CREATININE: 1 mg/dL (ref 0.7–1.3)
GLUCOSE: 99 mg/dL (ref 70–140)
POTASSIUM: 4.2 meq/L (ref 3.5–5.1)
Sodium: 143 mEq/L (ref 136–145)
Total Bilirubin: 0.27 mg/dL (ref 0.20–1.20)
Total Protein: 7.5 g/dL (ref 6.4–8.3)

## 2014-08-15 MED ORDER — IOHEXOL 300 MG/ML  SOLN
100.0000 mL | Freq: Once | INTRAMUSCULAR | Status: AC | PRN
  Administered 2014-08-15: 100 mL via INTRAVENOUS

## 2014-08-16 LAB — CEA: CEA: 2.1 ng/mL (ref 0.0–5.0)

## 2014-08-19 ENCOUNTER — Ambulatory Visit (HOSPITAL_BASED_OUTPATIENT_CLINIC_OR_DEPARTMENT_OTHER): Payer: Federal, State, Local not specified - PPO | Admitting: Nurse Practitioner

## 2014-08-19 VITALS — BP 152/89 | HR 87 | Temp 98.2°F | Resp 19 | Ht 70.0 in | Wt 290.6 lb

## 2014-08-19 DIAGNOSIS — G620 Drug-induced polyneuropathy: Secondary | ICD-10-CM

## 2014-08-19 DIAGNOSIS — C187 Malignant neoplasm of sigmoid colon: Secondary | ICD-10-CM

## 2014-08-19 DIAGNOSIS — F419 Anxiety disorder, unspecified: Secondary | ICD-10-CM

## 2014-08-19 DIAGNOSIS — R21 Rash and other nonspecific skin eruption: Secondary | ICD-10-CM

## 2014-08-19 DIAGNOSIS — C189 Malignant neoplasm of colon, unspecified: Secondary | ICD-10-CM

## 2014-08-19 DIAGNOSIS — I10 Essential (primary) hypertension: Secondary | ICD-10-CM

## 2014-08-19 NOTE — Progress Notes (Signed)
  [facility] OFFICE PROGRESS NOTE   Diagnosis:  Colon cancer  INTERVAL HISTORY:   Mr. Frutoso Chase returns as scheduled. He feels well. No change in bowel habits. No bloody or black stools. No abdominal pain. He denies nausea/vomiting. His only concern is related to "sexual function". He is able to achieve/maintain an erection but does not ejaculate.  Objective:  Vital signs in last 24 hours:  Blood pressure 152/89, pulse 87, temperature 98.2 F (36.8 C), temperature source Oral, resp. rate 19, height $RemoveBe'5\' 10"'wbXPZxUkT$  (1.778 m), weight 290 lb 9.6 oz (131.815 kg), SpO2 97 %.    HEENT: no thrush or ulcers. Lymphatics: no palpable cervical, supraclavicular, axillary or inguinal lymph nodes. Resp: lungs clear bilaterally. Cardio: regular rate and rhythm. GI: abdomen soft and nontender. No hepatomegaly. Vascular: no leg edema.     Lab Results:  Lab Results  Component Value Date   WBC 7.7 03/19/2014   HGB 14.9 03/19/2014   HCT 44.8 03/19/2014   MCV 93.0 03/19/2014   PLT 210 03/19/2014   NEUTROABS 4.1 03/19/2014    Imaging:  No results found.  Medications: I have reviewed the patient's current medications.  Assessment/Plan: 1. 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 The tumor returned microsatellite stable with no loss of mismatch repair protein expression.  Cycle 1 adjuvant CAPOX beginning 09/18/2013.   Cycle 7 CAPOX 01/22/2014   Cycle 8 CAPOX 02/12/2014 (oxaliplatin deleted).  CT scans chest, abdomen and pelvis 08/15/2014 with no evidence of metastatic disease. 2. Port-A-Cath placement 09/16/2013. Port-A-Cath removed 05/14/2014. 3. Gout. 4. History of anxiety maintained on Tegretol. 5. Hypertension. 6. Delayed nausea. Improved with Aloxi/Emend 7. Rash-the rash occurred following cycle 7 CAPOX. It is possible the rash is related to capecitabine. Improved. 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 9. Left lower leg wound.   Disposition: Mr. Whitner remains in clinical remission from colon cancer. He will return for a followup visit and CEA in 6 months.   He will contact Dr. Marcello Moores to schedule a one-year colonoscopy. Dr. Benay Spice recommends he discuss the ejaculation issue with Dr. Marcello Moores as well.  Plan reviewed with Dr. Benay Spice.    Ned Card ANP/GNP-BC   08/19/2014  4:26 PM

## 2014-08-20 ENCOUNTER — Telehealth: Payer: Self-pay | Admitting: Oncology

## 2014-08-20 NOTE — Telephone Encounter (Signed)
gv and printed appt sched and avs for pt for May 2016 °

## 2014-10-17 ENCOUNTER — Encounter (HOSPITAL_COMMUNITY): Payer: Self-pay | Admitting: *Deleted

## 2014-10-22 NOTE — Anesthesia Preprocedure Evaluation (Addendum)
Anesthesia Evaluation  Patient identified by MRN, date of birth, ID band Patient awake    Reviewed: Allergy & Precautions, NPO status , Patient's Chart, lab work & pertinent test results, reviewed documented beta blocker date and time   Airway Mallampati: II   Neck ROM: Full    Dental  (+) Teeth Intact, Dental Advisory Given   Pulmonary  breath sounds clear to auscultation        Cardiovascular hypertension, Pt. on medications Rhythm:Regular  EKG WNL 2014   Neuro/Psych Anxiety Depression    GI/Hepatic Neg liver ROS, SP Colon CA and Chemo   Endo/Other  negative endocrine ROS  Renal/GU negative Renal ROS     Musculoskeletal   Abdominal (+) + obese,   Peds  Hematology negative hematology ROS (+)   Anesthesia Other Findings   Reproductive/Obstetrics                            Anesthesia Physical Anesthesia Plan  ASA: II  Anesthesia Plan: MAC   Post-op Pain Management:    Induction: Intravenous  Airway Management Planned: Nasal Cannula  Additional Equipment:   Intra-op Plan:   Post-operative Plan:   Informed Consent: I have reviewed the patients History and Physical, chart, labs and discussed the procedure including the risks, benefits and alternatives for the proposed anesthesia with the patient or authorized representative who has indicated his/her understanding and acceptance.     Plan Discussed with:   Anesthesia Plan Comments:         Anesthesia Quick Evaluation

## 2014-10-23 ENCOUNTER — Encounter (HOSPITAL_COMMUNITY)
Admission: RE | Disposition: A | Discharge status: Home / Self Care | Payer: Self-pay | Source: Ambulatory Visit | Attending: General Surgery

## 2014-10-23 ENCOUNTER — Ambulatory Visit (HOSPITAL_COMMUNITY)
Admission: RE | Admit: 2014-10-23 | Discharge: 2014-10-23 | Disposition: A | Discharge status: Home / Self Care | Payer: Federal, State, Local not specified - PPO | Source: Ambulatory Visit | Attending: General Surgery | Admitting: General Surgery

## 2014-10-23 ENCOUNTER — Encounter (HOSPITAL_COMMUNITY): Payer: Self-pay

## 2014-10-23 ENCOUNTER — Ambulatory Visit (HOSPITAL_COMMUNITY): Payer: Federal, State, Local not specified - PPO | Admitting: Anesthesiology

## 2014-10-23 DIAGNOSIS — Z85038 Personal history of other malignant neoplasm of large intestine: Secondary | ICD-10-CM | POA: Insufficient documentation

## 2014-10-23 DIAGNOSIS — I10 Essential (primary) hypertension: Secondary | ICD-10-CM | POA: Diagnosis not present

## 2014-10-23 DIAGNOSIS — Z1211 Encounter for screening for malignant neoplasm of colon: Secondary | ICD-10-CM | POA: Insufficient documentation

## 2014-10-23 DIAGNOSIS — E669 Obesity, unspecified: Secondary | ICD-10-CM | POA: Diagnosis not present

## 2014-10-23 DIAGNOSIS — Z6841 Body Mass Index (BMI) 40.0 and over, adult: Secondary | ICD-10-CM | POA: Diagnosis not present

## 2014-10-23 DIAGNOSIS — K621 Rectal polyp: Secondary | ICD-10-CM | POA: Insufficient documentation

## 2014-10-23 DIAGNOSIS — F419 Anxiety disorder, unspecified: Secondary | ICD-10-CM | POA: Diagnosis not present

## 2014-10-23 DIAGNOSIS — K6389 Other specified diseases of intestine: Secondary | ICD-10-CM | POA: Insufficient documentation

## 2014-10-23 DIAGNOSIS — Z880 Allergy status to penicillin: Secondary | ICD-10-CM | POA: Diagnosis not present

## 2014-10-23 HISTORY — PX: COLONOSCOPY WITH PROPOFOL: SHX5780

## 2014-10-23 SURGERY — COLONOSCOPY WITH PROPOFOL
Anesthesia: Monitor Anesthesia Care

## 2014-10-23 MED ORDER — LACTATED RINGERS IV SOLN
INTRAVENOUS | Status: DC | PRN
  Administered 2014-10-23: 10:00:00 via INTRAVENOUS

## 2014-10-23 MED ORDER — PROPOFOL 10 MG/ML IV BOLUS
INTRAVENOUS | Status: AC
  Filled 2014-10-23: qty 20

## 2014-10-23 MED ORDER — PROPOFOL INFUSION 10 MG/ML OPTIME
INTRAVENOUS | Status: DC | PRN
  Administered 2014-10-23: 140 ug/kg/min via INTRAVENOUS

## 2014-10-23 MED ORDER — PROPOFOL 10 MG/ML IV BOLUS
INTRAVENOUS | Status: DC | PRN
  Administered 2014-10-23 (×3): 50 mg via INTRAVENOUS

## 2014-10-23 MED ORDER — MIDAZOLAM HCL 5 MG/5ML IJ SOLN
INTRAMUSCULAR | Status: DC | PRN
  Administered 2014-10-23 (×2): 1 mg via INTRAVENOUS

## 2014-10-23 MED ORDER — MIDAZOLAM HCL 2 MG/2ML IJ SOLN
INTRAMUSCULAR | Status: AC
  Filled 2014-10-23: qty 2

## 2014-10-23 MED ORDER — ONDANSETRON HCL 4 MG/2ML IJ SOLN
INTRAMUSCULAR | Status: DC | PRN
  Administered 2014-10-23: 4 mg via INTRAVENOUS

## 2014-10-23 NOTE — H&P (Signed)
John Hubbard is an 54 y.o. male.   HPI: This is a 54 y.o. M who is s/p LAR for a distal sigmoid cancer in Nov 2014.  He is here for his screening colonoscopy.  He denies any changes in BM's or blood per rectum.      Past Medical History  Diagnosis Date  . Anxiety   . Gout   . Hypertension     no meds   . Numbness and tingling in hands   . History of gout     10-17-14 no recent issues  . History of colon cancer   . History of bipolar disorder   . Cancer     hx colon cancer, surgery, chemotherapy -last 5'15    Past Surgical History  Procedure Laterality Date  . Hernia repair  11/15/06    Umbilical Hernia Repair  . Colonoscopy Bilateral 07/24/2013    Procedure: COLONOSCOPY;  Surgeon: [name], MD;  Location: [facility];  Service: Endoscopy;  Laterality: Bilateral;  . Laparoscopic partial colectomy N/A 08/14/2013    Procedure: LAPAROSCOPIC PARTIAL COLECTOMY wound vac placement;  Surgeon: [name], MD;  Location: [facility];  Service: General;  Laterality: N/A;  . Portacath placement Left 09/16/2013    Procedure: INSERTION PORT-A-CATH;  Surgeon: [name], MD;  Location: [facility];  Service: General;  Laterality: Left;  dressing change on lower abdominal wound  . Port-a-cath removal N/A 05/14/2014    Procedure: REMOVAL PORT-A-CATH;  Surgeon: [name], MD;  Location: [facility];  Service: General;  Laterality: N/A;    History reviewed. No pertinent family history. Social History:  reports that he has never smoked. He has never used smokeless tobacco. He reports that he drinks alcohol. He reports that he does not use illicit drugs.  Allergies:  Allergies  Allergen Reactions  . Penicillins Hives    Medications Prior to Admission  Medication Sig Dispense Refill  . allopurinol (ZYLOPRIM) 300 MG tablet Take 300 mg by mouth daily.     . carbamazepine (TEGRETOL) 200 MG tablet Take 200 mg by mouth 2 (two) times daily.    . Multiple Vitamin (MULTIVITAMIN  WITH MINERALS) TABS tablet Take 1 tablet by mouth daily.    Marland Kitchen acetaminophen (TYLENOL) 500 MG tablet Take 1,000 mg by mouth every 6 (six) hours as needed for mild pain or moderate pain.    Marland Kitchen colchicine 0.6 MG tablet Take 0.6 mg by mouth 2 (two) times daily as needed (Gout).     Marland Kitchen ibuprofen (ADVIL,MOTRIN) 200 MG tablet Take 400 mg by mouth every 6 (six) hours as needed for mild pain.    Marland Kitchen lidocaine-prilocaine (EMLA) cream Apply 1 application topically as needed. (Patient not taking: Reported on 10/02/2014) 30 g 0    No results found for this or any previous visit (from the past 48 hour(s)). No results found.  Review of Systems  Constitutional: Negative for fever and chills.  HENT: Negative for hearing loss.   Eyes: Negative for blurred vision and double vision.  Respiratory: Negative for cough, sputum production and shortness of breath.   Cardiovascular: Negative for chest pain and leg swelling.  Gastrointestinal: Negative for nausea, vomiting and abdominal pain.  Genitourinary: Negative for dysuria, urgency and frequency.  Musculoskeletal: Negative for myalgias and neck pain.  Neurological: Negative for headaches.    There were no vitals taken for this visit. Physical Exam  Constitutional: He is oriented to person, place, and time. He appears well-developed and well-nourished.  HENT:  Head: Normocephalic and atraumatic.  Eyes: Conjunctivae are normal. Pupils are equal, round, and reactive to light.  Neck: Normal range of motion. No tracheal deviation present.  Cardiovascular: Normal rate and regular rhythm.   Respiratory: Effort normal and breath sounds normal.  GI: Soft. Bowel sounds are normal. He exhibits no distension. There is no tenderness.  Musculoskeletal: Normal range of motion.  Neurological: He is alert and oriented to person, place, and time.  Skin: Skin is warm and dry.     Assessment/Plan Colonoscopy for screening due to high risk features (personal history of colon  cancer).  Risks and benefits were explained to the patient.  These include bleeding and perforation.  John Hubbard C. 1/75/1025, 8:52 AM

## 2014-10-23 NOTE — Anesthesia Postprocedure Evaluation (Signed)
  Anesthesia Post-op Note  Patient: John Hubbard  Procedure(s) Performed: Procedure(s): COLONOSCOPY WITH PROPOFOL (N/A)  Patient Location: PACU  Anesthesia Type:General  Level of Consciousness: awake  Airway and Oxygen Therapy: Patient Spontanous Breathing  Post-op Pain: none  Post-op Assessment: Post-op Vital signs reviewed  Post-op Vital Signs: Reviewed and stable  Last Vitals:  Filed Vitals:   10/23/14 1130  BP: 151/97  Pulse: 83  Temp:   Resp: 14    Complications: No apparent anesthesia complications

## 2014-10-23 NOTE — Discharge Instructions (Signed)
Post Colonoscopy Instructions ° °1. DIET: Follow a light bland diet the first 24 hours after arrival home, such as soup, liquids, crackers, etc.  Be sure to include lots of fluids daily.  Avoid fast food or heavy meals as your are more likely to get nauseated.   °2. You may have some mild rectal bleeding for the first few days after the procedure.  This should get less and less with time.  Resume any blood thinners 2 days after your procedure unless directed otherwise by your physician. °3. Take your usually prescribed home medications unless otherwise directed. °a. If you have any pain, it is helpful to get up and walk around, as it is usually from excess gas. °b. If this is not helpful, you can take an over-the-counter pain medication.  Choose one of the following that works best for you: °i. Naproxen (Aleve, etc)  Two 220mg tabs twice a day °ii. Ibuprofen (Advil, etc) Three 200mg tabs four times a day (every meal & bedtime) °iii. If you still have pain after using one of these, please call the office °4. It is normal to not have a bowel movement for 2-3 days after colonoscopy.   ° °5. ACTIVITIES as tolerated:   °6. You may resume regular (light) daily activities beginning the next day--such as daily self-care, walking, climbing stairs--gradually increasing activities as tolerated.  ° ° °WHEN TO CALL US [phone]: °1. Fever over 101.5 F (38.5 C)  °2. Severe abdominal or chest pain  °3. Large amount of rectal bleeding, passing multiple blood clots  °4. Dizziness or shortness of breath °5. Increasing nausea or vomiting ° ° The clinic staff is available to answer your questions during regular business hours (8:30am-5pm).  Please don’t hesitate to call and ask to speak to one of our nurses for clinical concerns.  ° If you have a medical emergency, go to the nearest emergency room or call 911. ° A surgeon from [facility] is always on call at the hospitals ° ° °[facility] °[address], [address], [place], [place]  [address] ? °MAIN: [phone] ? TOLL FREE: [phone] ?  °FAX [phone] °[email or web address] ° ° °

## 2014-10-23 NOTE — Transfer of Care (Signed)
Immediate Anesthesia Transfer of Care Note  Patient: John Hubbard  Procedure(s) Performed: Procedure(s): COLONOSCOPY WITH PROPOFOL (N/A)  Patient Location: Endoscopy Unit  Anesthesia Type:MAC  Level of Consciousness: awake, alert  and oriented  Airway & Oxygen Therapy: Patient Spontanous Breathing and Patient connected to nasal cannula oxygen  Post-op Assessment: Report given to PACU RN  Post vital signs: Reviewed and stable  Complications: No apparent anesthesia complications

## 2014-10-23 NOTE — Op Note (Signed)
[facility] [place], [address]   COLONOSCOPY PROCEDURE REPORT  PATIENT: John, Hubbard  MR#: [id number] BIRTHDATE: 09/04/1961 , 26  yrs. old GENDER: male ENDOSCOPIST: Rosario Adie, MD REFERRED OV:ZCHYI Rex Kras, M.D.  Arturo Morton, M.D. PROCEDURE DATE:  10/23/2014 PROCEDURE:   Colonoscopy with biopsy First Screening Colonoscopy - Avg.  risk and is 50 yrs.  old or older - No.  Prior Negative Screening - Now for repeat screening. Above average risk  History of Adenoma - Now for follow-up colonoscopy & has been > or = to 3 yrs.  No.  It has been less than 3 yrs since last colonoscopy.  Other: See Comments  Polyps Removed Today? Yes. ASA CLASS: INDICATIONS:high risk personal history of colon cancer. MEDICATIONS: Per Anesthesia  DESCRIPTION OF PROCEDURE:   After the risks benefits and alternatives of the procedure were thoroughly explained, informed consent was obtained.  The digital rectal exam revealed no abnormalities of the rectum.   The Pentax Ped Colon [id number] endoscope was introduced through the anus and advanced to the cecum, which was identified by both the appendix and ileocecal valve. No adverse events experienced.   The quality of the prep was excellent, using MiraLax  The instrument was then slowly withdrawn as the colon was fully examined.      COLON FINDINGS: Two polypoid shaped sessile polyps measuring 3 mm in size were found in the rectum.  Multiple biopsies were performed using cold forceps.   There was evidence of a normal appearing prior surgical anastomosis in the rectum and at the surgical anastomosis.  Retroflexed views revealed no abnormalities. The time to cecum=10 minutes 0 seconds.  Withdrawal time=24 minutes 0 seconds.  The scope was withdrawn and the procedure completed. COMPLICATIONS: There were no immediate complications.  ENDOSCOPIC IMPRESSION: 1.   Two sessile polyps were found in the distal rectum;  excisional biopsies were performed using cold forceps 2.   There was evidence of prior colorectal surgical end to end anastomosis  RECOMMENDATIONS: Repeat Colonoscopy in 3 years, unless pathology shows adenomatous polyps.  eSigned:  Rosario Adie, MD 50/27/7412 11:41 AM   cc: Kavin Leech, MD and Tamsen Roers, MD

## 2014-10-24 ENCOUNTER — Encounter (HOSPITAL_COMMUNITY): Payer: Self-pay | Admitting: General Surgery

## 2015-01-14 ENCOUNTER — Telehealth: Payer: Self-pay | Admitting: Oncology

## 2015-01-14 NOTE — Telephone Encounter (Signed)
MAILED PT MEDICAL RECORDS TO [facility] [place]

## 2015-02-02 ENCOUNTER — Telehealth: Payer: Self-pay | Admitting: *Deleted

## 2015-02-02 NOTE — Telephone Encounter (Signed)
Message from Vincent with Dr. Rex Kras reporting pt has a rash on his legs. Dr. Rex Kras recommends he see Dr. Benay Spice within 1-2 weeks. Referral was also made to dermatology, pt has appt 02/03/15.  Reviewed above with Dr. Benay Spice: Pt had rash in the past thought to be related to Xeloda. Pt has been off therapy. Recommend pt follow up with dermatology for rash.

## 2015-02-09 ENCOUNTER — Telehealth: Payer: Self-pay

## 2015-02-09 ENCOUNTER — Other Ambulatory Visit: Payer: Federal, State, Local not specified - PPO

## 2015-02-09 ENCOUNTER — Other Ambulatory Visit: Payer: Self-pay | Admitting: *Deleted

## 2015-02-09 ENCOUNTER — Encounter: Payer: Federal, State, Local not specified - PPO | Admitting: Nurse Practitioner

## 2015-02-09 DIAGNOSIS — C189 Malignant neoplasm of colon, unspecified: Secondary | ICD-10-CM

## 2015-02-09 NOTE — Telephone Encounter (Addendum)
Returning wife's call from 810 am. Pt is having rash on lower legs that has spread to upper legs, buttocks and low back. The lower legs are now papular, slightly pussy and leaking. Pt has dermatology appt on 5/13. Pt does not have fever. Pt is fatigued.

## 2015-02-09 NOTE — Progress Notes (Signed)
Spoke to wife- pt is going to call Dr. Rex Kras office to be seen today or tomorrow in regards to rash. Pt also has a dermatology appt on Friday. Suggested pt try to call the office and get an appt sooner. Pt has an appt with Dr. Benay Spice on May 26. Wife voiced an understanding and knows to call us if pt can not be seen sooner with dermatology or Dr. Eddie Dibbles office.

## 2015-02-25 ENCOUNTER — Telehealth: Payer: Self-pay | Admitting: Oncology

## 2015-02-25 NOTE — Telephone Encounter (Signed)
Pt called to r/s due to work schedule, pt confirmed labs/ov .... KJ

## 2015-02-26 ENCOUNTER — Other Ambulatory Visit: Payer: Federal, State, Local not specified - PPO

## 2015-02-26 ENCOUNTER — Ambulatory Visit: Payer: Federal, State, Local not specified - PPO | Admitting: Oncology

## 2015-03-23 ENCOUNTER — Telehealth: Payer: Self-pay | Admitting: Oncology

## 2015-03-23 NOTE — Telephone Encounter (Signed)
Returned patient call re rescheduling 6/21 lab/BS. Left message for patient that before moving his appointment I wanted to make him aware that the next available PM appointments is not until 05/26/15. Left message for patient asking that he call me at my direct number (given) to let me know if this is ok.

## 2015-03-24 ENCOUNTER — Other Ambulatory Visit: Payer: Federal, State, Local not specified - PPO

## 2015-03-24 ENCOUNTER — Ambulatory Visit: Payer: Federal, State, Local not specified - PPO | Admitting: Oncology

## 2015-03-25 ENCOUNTER — Institutional Professional Consult (permissible substitution): Payer: Federal, State, Local not specified - PPO | Admitting: Neurology

## 2015-05-26 ENCOUNTER — Other Ambulatory Visit: Payer: Federal, State, Local not specified - PPO

## 2015-05-26 ENCOUNTER — Ambulatory Visit: Payer: Federal, State, Local not specified - PPO | Admitting: Oncology

## 2015-11-14 IMAGING — CR DG CHEST 1V PORT
1 series · 1 of 1 positions shown · non-contrast
Comparison: Chest CT dated 07/29/2013

CLINICAL DATA: Colon cancer.  Port-A-Cath placement.

EXAM:
PORTABLE CHEST - 1 VIEW

[view not recorded]
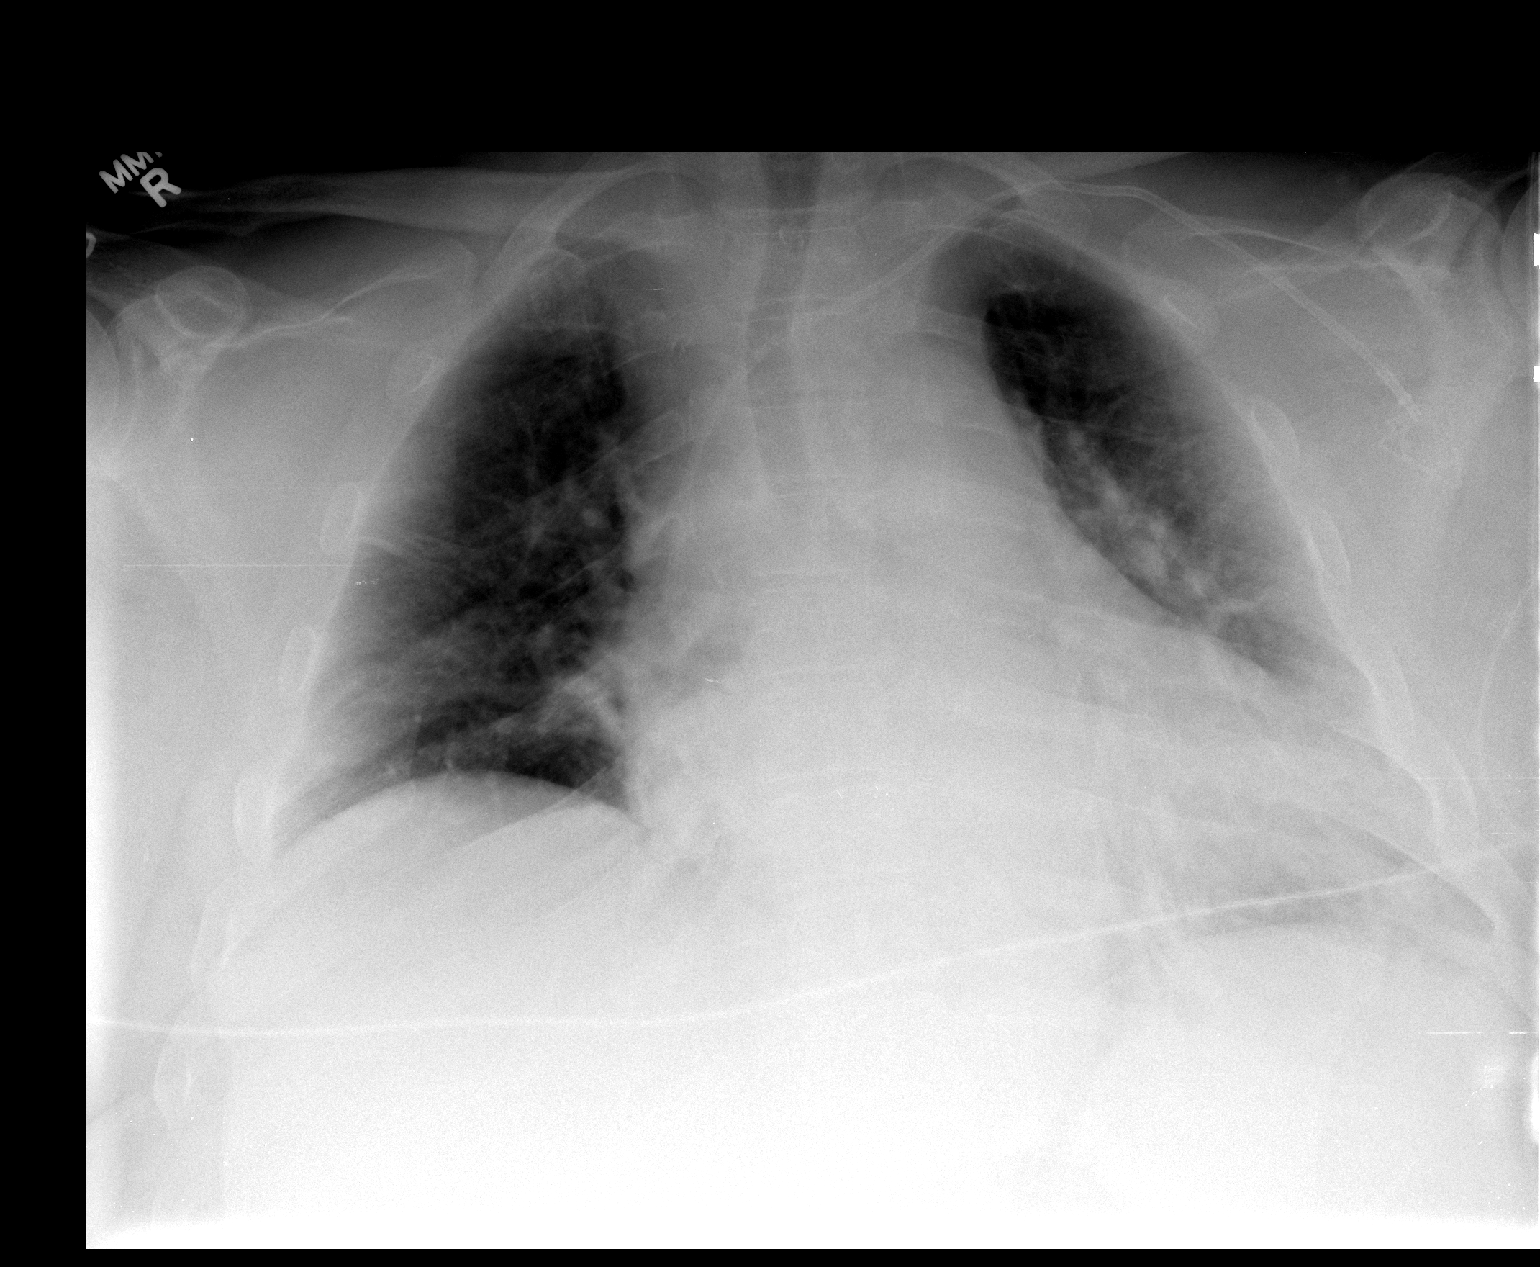

[1 of 1 positions shown; findings below may reference images not displayed]

FINDINGS: Power port tip appears in good position in the superior vena cava in
the AP projection. Prominent superior mediastinum is due to
prominent mediastinal fat as demonstrated on the prior CT scan. No
pneumothorax. Minimal atelectasis at the left lung base. No acute
osseous abnormality.
IMPRESSION: Port-A-Cath in good position. No pneumothorax. Slight atelectasis at
the left base.

## 2016-09-28 ENCOUNTER — Other Ambulatory Visit: Payer: Self-pay | Admitting: Nurse Practitioner

## 2016-10-17 ENCOUNTER — Telehealth: Payer: Self-pay | Admitting: Oncology

## 2016-10-17 NOTE — Telephone Encounter (Signed)
Pt wife called to schedule pt missed appt. Gave pt next available date 1/26 at 8 am

## 2016-10-28 ENCOUNTER — Ambulatory Visit (HOSPITAL_BASED_OUTPATIENT_CLINIC_OR_DEPARTMENT_OTHER): Payer: Federal, State, Local not specified - PPO

## 2016-10-28 ENCOUNTER — Ambulatory Visit (HOSPITAL_BASED_OUTPATIENT_CLINIC_OR_DEPARTMENT_OTHER): Payer: Federal, State, Local not specified - PPO | Admitting: Oncology

## 2016-10-28 ENCOUNTER — Telehealth: Payer: Self-pay | Admitting: *Deleted

## 2016-10-28 ENCOUNTER — Telehealth: Payer: Self-pay | Admitting: Oncology

## 2016-10-28 VITALS — BP 140/93 | HR 78 | Temp 98.1°F | Resp 18 | Ht 70.0 in | Wt 318.3 lb

## 2016-10-28 DIAGNOSIS — C187 Malignant neoplasm of sigmoid colon: Secondary | ICD-10-CM

## 2016-10-28 DIAGNOSIS — M109 Gout, unspecified: Secondary | ICD-10-CM

## 2016-10-28 DIAGNOSIS — Z85038 Personal history of other malignant neoplasm of large intestine: Secondary | ICD-10-CM

## 2016-10-28 DIAGNOSIS — I10 Essential (primary) hypertension: Secondary | ICD-10-CM

## 2016-10-28 LAB — CBC WITH DIFFERENTIAL/PLATELET
BASO%: 0.7 % (ref 0.0–2.0)
Basophils Absolute: 0 10*3/uL (ref 0.0–0.1)
EOS%: 4.7 % (ref 0.0–7.0)
Eosinophils Absolute: 0.3 10*3/uL (ref 0.0–0.5)
HEMATOCRIT: 47.5 % (ref 38.4–49.9)
HEMOGLOBIN: 15.9 g/dL (ref 13.0–17.1)
LYMPH#: 1.8 10*3/uL (ref 0.9–3.3)
LYMPH%: 27.5 % (ref 14.0–49.0)
MCH: 27.9 pg (ref 27.2–33.4)
MCHC: 33.5 g/dL (ref 32.0–36.0)
MCV: 83.2 fL (ref 79.3–98.0)
MONO#: 0.4 10*3/uL (ref 0.1–0.9)
MONO%: 6.5 % (ref 0.0–14.0)
NEUT#: 3.9 10*3/uL (ref 1.5–6.5)
NEUT%: 60.6 % (ref 39.0–75.0)
Platelets: 186 10*3/uL (ref 140–400)
RBC: 5.71 10*6/uL (ref 4.20–5.82)
RDW: 13.2 % (ref 11.0–14.6)
WBC: 6.5 10*3/uL (ref 4.0–10.3)

## 2016-10-28 LAB — COMPREHENSIVE METABOLIC PANEL
ALBUMIN: 3.9 g/dL (ref 3.5–5.0)
ALK PHOS: 146 U/L (ref 40–150)
ALT: 31 U/L (ref 0–55)
AST: 20 U/L (ref 5–34)
Anion Gap: 9 mEq/L (ref 3–11)
BUN: 16.1 mg/dL (ref 7.0–26.0)
CALCIUM: 9.6 mg/dL (ref 8.4–10.4)
CO2: 28 mEq/L (ref 22–29)
Chloride: 102 mEq/L (ref 98–109)
Creatinine: 0.9 mg/dL (ref 0.7–1.3)
EGFR: >90 mL/min/{1.73_m2} (ref >90)
Glucose: 134 mg/dl (ref 70–140)
Potassium: 4.4 mEq/L (ref 3.5–5.1)
Sodium: 140 mEq/L (ref 136–145)
Total Bilirubin: 0.29 mg/dL (ref 0.20–1.20)
Total Protein: 7.5 g/dL (ref 6.4–8.3)

## 2016-10-28 LAB — CEA (IN HOUSE-CHCC): CEA (CHCC-IN HOUSE): 2.57 ng/mL (ref 0.00–5.00)

## 2016-10-28 NOTE — Telephone Encounter (Signed)
-----   Message from Ladell Pier, MD sent at 10/28/2016  3:54 PM EST ----- Please call patient, cea is normal

## 2016-10-28 NOTE — Progress Notes (Signed)
  [place] OFFICE PROGRESS NOTE   Diagnosis: Colon cancer  INTERVAL HISTORY:   Mr. Turpin was last seen at [facility] in November 2015. He missed subsequent follow-up visits. He no longer has a primary physician. He reports a good appetite. He is working. No difficulty with bowel function. He has mild intermittent tingling in the feet. No other neuropathy symptoms. He reports developing multiple ulcerations over the lower leg bilaterally following chemotherapy. He was evaluated by dermatology and diagnosed with "vasculitis ". The ulcers took months to heal.  He has not undergone a surveillance colonoscopy.  Objective:  Vital signs in last 24 hours:  Blood pressure (!) 140/93, pulse 78, temperature 98.1 F (36.7 C), temperature source Oral, resp. rate 18, height '5\' 10"'$  (1.778 m), weight (!) 318 lb 4.8 oz (144.4 kg), SpO2 96 %.    HEENT: Neck without mass Lymphatics: No cervical, supraclavicular, axillary, or inguinal nodes Resp: Lungs clear bilaterally Cardio: Regular rate and rhythm GI: Obese, no hepatosplenomegaly, no mass Vascular: No leg edema  Skin: Mild confluent erythema over the trunk, hyperpigmented flat plaques over the pretibial area bilaterally. No ulceration.   Lab Results:  Lab Results  Component Value Date   WBC 6.5 10/28/2016   HGB 15.9 10/28/2016   HCT 47.5 10/28/2016   MCV 83.2 10/28/2016   PLT 186 10/28/2016   NEUTROABS 3.9 10/28/2016   CEA-2.57  Medications: I have reviewed the patient's current medications.  Assessment/Plan: 1. 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 The tumor returned microsatellite stable with no loss of mismatch repair protein expression.  Cycle 1 adjuvant CAPOX beginning 09/18/2013.   Cycle 7 CAPOX 01/22/2014   Cycle 8 CAPOX 02/12/2014 (oxaliplatin deleted).  CT scans chest, abdomen and pelvis 08/15/2014 with no  evidence of metastatic disease. 2. Port-A-Cath placement 09/16/2013. Port-A-Cath removed 05/14/2014. 3. Gout. 4. History of anxiety maintained on Tegretol. 5. Hypertension. 6. Delayed nausea. Improved with Aloxi/Emend 7. Rash-the rash occurred following cycle 7 CAPOX. It is possible the rash is related to capecitabine. Improved.  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    Disposition:  Mr. Cloe has a history of stage III colon cancer dating to November 2014. He completed adjuvant CAPOX chemotherapy in May 2015. He remains in clinical remission. He has been lost to follow-up over the past 2 years. He will be scheduled for a surveillance CT evaluation. I referred him to Dr. Marcello Moores for a surveillance colonoscopy.  It is unclear whether the "vasculitis "was related to the course of chemotherapy. The ulcerations at the pretibial areas have healed.  He currently does not have a primary care physician. We made a referral to the [facility] clinic.  Mr. Pollio will return for an office visit and CEA in 6 months.  30 minutes were spent with the patient today. The majority of the time was used for counseling and coordination of care.  Betsy Coder, MD  10/28/2016  4:12 PM

## 2016-10-28 NOTE — Telephone Encounter (Signed)
Appointments scheduled per 12/6 LOS. Patient given AVS report and calendars with future scheduled appointments. °

## 2016-11-09 ENCOUNTER — Telehealth: Payer: Self-pay

## 2016-11-09 NOTE — Telephone Encounter (Addendum)
Patients wife called stating pt still needs appt for PCP and colonoscopy. Thinks pt has bronchitis and requesting visit to be done at the same time. Pt is scheduled 11/11/16 at [facility], colonoscopy still needs scheduled- message sent to HIM. Attempted to call patients wife back with no answer.

## 2016-11-11 ENCOUNTER — Ambulatory Visit: Payer: Federal, State, Local not specified - PPO | Admitting: Family Medicine

## 2016-11-12 ENCOUNTER — Other Ambulatory Visit: Payer: Self-pay | Admitting: Oncology

## 2016-11-12 DIAGNOSIS — C187 Malignant neoplasm of sigmoid colon: Secondary | ICD-10-CM

## 2016-11-15 ENCOUNTER — Ambulatory Visit (HOSPITAL_BASED_OUTPATIENT_CLINIC_OR_DEPARTMENT_OTHER)
Admission: RE | Admit: 2016-11-15 | Discharge: 2016-11-15 | Disposition: A | Discharge status: Home / Self Care | Payer: Federal, State, Local not specified - PPO | Source: Ambulatory Visit | Attending: Oncology | Admitting: Oncology

## 2016-11-15 ENCOUNTER — Ambulatory Visit (HOSPITAL_COMMUNITY): Payer: Federal, State, Local not specified - PPO

## 2016-11-15 DIAGNOSIS — R590 Localized enlarged lymph nodes: Secondary | ICD-10-CM | POA: Insufficient documentation

## 2016-11-15 DIAGNOSIS — R911 Solitary pulmonary nodule: Secondary | ICD-10-CM | POA: Diagnosis not present

## 2016-11-15 DIAGNOSIS — C187 Malignant neoplasm of sigmoid colon: Secondary | ICD-10-CM | POA: Insufficient documentation

## 2016-11-15 DIAGNOSIS — C189 Malignant neoplasm of colon, unspecified: Secondary | ICD-10-CM | POA: Diagnosis not present

## 2016-11-15 MED ORDER — IOPAMIDOL (ISOVUE-300) INJECTION 61%
100.0000 mL | Freq: Once | INTRAVENOUS | Status: AC | PRN
  Administered 2016-11-15: 100 mL via INTRAVENOUS

## 2016-11-18 ENCOUNTER — Encounter: Payer: Self-pay | Admitting: Family Medicine

## 2016-11-18 ENCOUNTER — Ambulatory Visit (INDEPENDENT_AMBULATORY_CARE_PROVIDER_SITE_OTHER): Payer: Federal, State, Local not specified - PPO | Admitting: Family Medicine

## 2016-11-18 VITALS — BP 148/94 | HR 83 | Temp 98.1°F | Ht 66.73 in | Wt 321.6 lb

## 2016-11-18 DIAGNOSIS — Z1159 Encounter for screening for other viral diseases: Secondary | ICD-10-CM | POA: Diagnosis not present

## 2016-11-18 DIAGNOSIS — M1A9XX0 Chronic gout, unspecified, without tophus (tophi): Secondary | ICD-10-CM

## 2016-11-18 DIAGNOSIS — Z0000 Encounter for general adult medical examination without abnormal findings: Secondary | ICD-10-CM

## 2016-11-18 DIAGNOSIS — R030 Elevated blood-pressure reading, without diagnosis of hypertension: Secondary | ICD-10-CM | POA: Diagnosis not present

## 2016-11-18 DIAGNOSIS — Z114 Encounter for screening for human immunodeficiency virus [HIV]: Secondary | ICD-10-CM

## 2016-11-18 DIAGNOSIS — F5231 Female orgasmic disorder: Secondary | ICD-10-CM

## 2016-11-18 DIAGNOSIS — Z6841 Body Mass Index (BMI) 40.0 and over, adult: Secondary | ICD-10-CM | POA: Diagnosis not present

## 2016-11-18 DIAGNOSIS — Z125 Encounter for screening for malignant neoplasm of prostate: Secondary | ICD-10-CM | POA: Diagnosis not present

## 2016-11-18 DIAGNOSIS — G4730 Sleep apnea, unspecified: Secondary | ICD-10-CM | POA: Diagnosis not present

## 2016-11-18 DIAGNOSIS — Z87898 Personal history of other specified conditions: Secondary | ICD-10-CM | POA: Diagnosis not present

## 2016-11-18 DIAGNOSIS — Z23 Encounter for immunization: Secondary | ICD-10-CM

## 2016-11-18 DIAGNOSIS — F5232 Male orgasmic disorder: Secondary | ICD-10-CM

## 2016-11-18 DIAGNOSIS — IMO0002 Reserved for concepts with insufficient information to code with codable children: Secondary | ICD-10-CM

## 2016-11-18 MED ORDER — ALLOPURINOL 300 MG PO TABS: 300.0000 mg | ORAL_TABLET | Freq: Every day | ORAL | 3 refills | Status: DC

## 2016-11-18 NOTE — Progress Notes (Signed)
Pre visit review using our clinic review tool, if applicable. No additional management support is needed unless otherwise documented below in the visit note. 

## 2016-11-18 NOTE — Patient Instructions (Addendum)
I have put in two referral- nutrition and sleep clinic. You will receive a call regarding appointments.  Please schedule a lab only visit to have fasting labs. I will notify you via mychart with the results when I get them.  Please follow up in 6 month to check on how your weight loss is going and to discuss any other issues.   Keeping you healthy  Get these tests  Blood pressure- Have your blood pressure checked once a year by your healthcare provider.  Normal blood pressure is 120/80  Weight- Have your body mass index (BMI) calculated to screen for obesity.  BMI is a measure of body fat based on height and weight. You can also calculate your own BMI at ViewBanking.si.  Cholesterol- Have your cholesterol checked every year.  Diabetes- Have your blood sugar checked regularly if you have high blood pressure, high cholesterol, have a family history of diabetes or if you are overweight.  Screening for Colon Cancer- Colonoscopy starting at age 90.  Screening may begin sooner depending on your family history and other health conditions. Follow up colonoscopy as directed by your Gastroenterologist.  Screening for Prostate Cancer- Both blood work (PSA) and a rectal exam help screen for Prostate Cancer.  Screening begins at age 49 with African-American men and at age 15 with Caucasian men.  Screening may begin sooner depending on your family history.  Take these medicines  Aspirin- One aspirin daily can help prevent Heart disease and Stroke.  Flu shot- Every fall.  Tetanus- Every 10 years.  Zostavax- Once after the age of 71 to prevent Shingles.  Pneumonia shot- Once after the age of 55; if you are younger than 48, ask your healthcare provider if you need a Pneumonia shot.  Take these steps  Don't smoke- If you do smoke, talk to your doctor about quitting.  For tips on how to quit, go to www.smokefree.gov or call 1-800-QUIT-NOW.  Be physically active- Exercise 5 days a week for  at least 30 minutes.  If you are not already physically active start slow and gradually work up to 30 minutes of moderate physical activity.  Examples of moderate activity include walking briskly, mowing the yard, dancing, swimming, bicycling, etc.  Eat a healthy diet- Eat a variety of healthy food such as fruits, vegetables, low fat milk, low fat cheese, yogurt, lean meant, poultry, fish, beans, tofu, etc. For more information go to www.thenutritionsource.org  Drink alcohol in moderation- Limit alcohol intake to less than two drinks a day. Never drink and drive.  Dentist- Brush and floss twice daily; visit your dentist twice a year.  Depression- Your emotional health is as important as your physical health. If you're feeling down, or losing interest in things you would normally enjoy please talk to your healthcare provider.  Eye exam- Visit your eye doctor every year.  Safe sex- If you may be exposed to a sexually transmitted infection, use a condom.  Seat belts- Seat belts can save your life; always wear one.  Smoke/Carbon Monoxide detectors- These detectors need to be installed on the appropriate level of your home.  Replace batteries at least once a year.  Skin cancer- When out in the sun, cover up and use sunscreen 15 SPF or higher.  Violence- If anyone is threatening you, please tell your healthcare provider.  Living Will/ Health care power of attorney- Speak with your healthcare provider and family.

## 2016-11-18 NOTE — Progress Notes (Signed)
Subjective:    Patient ID: John Hubbard, male    DOB: Feb 26, 1961, 56 y.o.   MRN: [id number]  HPI This is a 56 yo male who presents today to establish care. Previously saw Dr. Rex Kras. Does not remember when he had last physical. Sees Dr. Ammie Dalton for f/u of colon cancer. Had CMP, CEA and CBC completed 3 weeks ago. 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 Enjoys his job, not too stressful. Hobbies include exercise, snow ski, cooking/good food. Plays keyboards.   Last CPE- unsure PSA- not sure if he has had Colonoscopy- 10/23/14  Tdap- overdue, will have today Flu- declines Dental- over due, needs to find dentist in network Eye- not recent Exercise- walks  Has a history of gout and has been on allopurinol. Gout was in right and left great toes. Thinks that gout was related to hospitalization and antibiotic. Does not recall any food triggers. Would like to stay on allopurinol.   Is at heaviest weight. Busy job and sits more. Is trying to eat healthier and exercise more. He and his wife work llong days, makes meal planning difficult. Drinks a protein shake for breakfast (whey/veggie protein, milk, 1/2 banana) is very hungry by lunch time.   Wife concerned about sleep apnea. States that he snores frequently and loudly and has periods of not breathing. Sleeps from 10-5:45. Sleeps well. Occasionally tired during the day.    Past Medical History:  Diagnosis Date  . Anxiety   . Cancer ([place])    hx colon cancer, surgery, chemotherapy -last 5'15  . Gout   . History of bipolar disorder   . History of colon cancer   . History of gout    10-17-14 no recent issues  . Hypertension    no meds   . Numbness and tingling in hands    Past Surgical History:  Procedure Laterality Date  . COLONOSCOPY Bilateral 07/24/2013   Procedure: COLONOSCOPY;  Surgeon: [name], MD;  Location: [facility];  Service: Endoscopy;  Laterality: Bilateral;  . COLONOSCOPY WITH PROPOFOL  N/A 10/23/2014   Procedure: COLONOSCOPY WITH PROPOFOL;  Surgeon: [name], MD;  Location: [facility];  Service: Endoscopy;  Laterality: N/A;  . HERNIA REPAIR  [id number]   Umbilical Hernia Repair  . LAPAROSCOPIC PARTIAL COLECTOMY N/A 08/14/2013   Procedure: LAPAROSCOPIC PARTIAL COLECTOMY wound vac placement;  Surgeon: [name], MD;  Location: [facility];  Service: General;  Laterality: N/A;  . PORT-A-CATH REMOVAL N/A 05/14/2014   Procedure: REMOVAL PORT-A-CATH;  Surgeon: [name], MD;  Location: [facility];  Service: General;  Laterality: N/A;  . PORTACATH PLACEMENT Left 09/16/2013   Procedure: INSERTION PORT-A-CATH;  Surgeon: [name], MD;  Location: [facility];  Service: General;  Laterality: Left;  dressing change on lower abdominal wound   Family History  Problem Relation Age of Onset  . Heart disease Father    Social History  Substance Use Topics  . Smoking status: Never Smoker  . Smokeless tobacco: Never Used  . Alcohol use Yes     Comment: rare      Review of Systems  Constitutional: Positive for unexpected weight change. Negative for chills, fatigue and fever.  HENT: Positive for dental problem (needs some dental work) and rhinorrhea (occasional, intermittent).   Eyes: Positive for redness.  Respiratory: Positive for cough (resolving) and chest tightness (once every 3-4 months, lasts about 1 minute. Entire upper chest.Not brought on by exercise. Usually after prolonged sitting. ).  Negative for shortness of breath.   Cardiovascular: Negative for chest pain, palpitations and leg swelling.  Gastrointestinal: Negative for abdominal pain, blood in stool, constipation, diarrhea and nausea.  Endocrine: Negative for polydipsia, polyphagia and polyuria.  Genitourinary: Negative for difficulty urinating, dysuria and frequency.       Is able to have an erection, but is not able to have an orgasim. Has tried cialis with little improvement. Still has normal  desire.   Musculoskeletal: Negative for back pain and neck pain.  Skin: Positive for color change (lower legs darker after vasculitis).  Neurological: Negative for dizziness and headaches.  Hematological: Negative for adenopathy. Does not bruise/bleed easily.  Psychiatric/Behavioral: Negative for dysphoric mood and sleep disturbance. The patient is not nervous/anxious.        During college was diagnosed with bipolar. No depression or anxiety.        Objective:   Physical Exam  Constitutional: He is oriented to person, place, and time. He appears well-developed and well-nourished. No distress.  Morbidly obese.   HENT:  Head: Normocephalic and atraumatic.  Right Ear: External ear normal.  Left Ear: External ear normal.  Nose: Nose normal.  Mouth/Throat: Oropharynx is clear and moist. No oropharyngeal exudate.  Eyes: Conjunctivae are normal. Pupils are equal, round, and reactive to light. Right eye exhibits no discharge. Left eye exhibits no discharge.  Mildly injected sclera.   Neck: Normal range of motion. Neck supple.  Cardiovascular: Normal rate, regular rhythm and normal heart sounds.   Pulmonary/Chest: Effort normal and breath sounds normal.  Abdominal: Soft. Bowel sounds are normal. There is no tenderness. There is no rebound and no guarding. Hernia confirmed negative in the right inguinal area and confirmed negative in the left inguinal area.  Large, midline umbilical hernia, visible with abdominal flexion.   Genitourinary: Testes normal and penis normal. Circumcised. No penile erythema. No discharge found.  Musculoskeletal: He exhibits edema (1+ pretibial edema).  Lymphadenopathy:    He has no cervical adenopathy.       Right: No inguinal adenopathy present.       Left: No inguinal adenopathy present.  Neurological: He is alert and oriented to person, place, and time.  Skin: Skin is warm and dry. He is not diaphoretic.  Lower extremities with chronic hyperpigmentation.    Psychiatric: He has a normal mood and affect. His behavior is normal. Judgment and thought content normal.  Vitals reviewed.     BP (!) 148/94 (BP Location: Right Arm, Patient Position: Sitting, Cuff Size: Normal)   Pulse 83   Temp 98.1 F (36.7 C) (Oral)   Ht 5' 6.73" (1.695 m)   Wt (!) 321 lb 9.6 oz (145.9 kg)   SpO2 95%   BMI 50.78 kg/m  Wt Readings from Last 3 Encounters:  11/18/16 (!) 321 lb 9.6 oz (145.9 kg)  10/28/16 (!) 318 lb 4.8 oz (144.4 kg)  10/23/14 290 lb (131.5 kg)   BP Readings from Last 3 Encounters:  11/18/16 (!) 148/94  10/28/16 (!) 140/93  10/23/14 (!) 151/97       Assessment & Plan:  1. Annual physical exam - Discussed and encouraged healthy lifestyle choices- adequate sleep, regular exercise, stress management and healthy food choices.   2. BMI 50.0-59.9, adult ([place]) - provided basic dietary guidance, encouraged him to eat regular meals, increase vegetables and fruits - Ambulatory referral to diabetic education - Lipid panel; Future - Hemoglobin A1c; Future  3. History of snoring - Ambulatory referral to Sleep Studies  4. Sleep apnea in adult - Ambulatory referral to Sleep Studies  5. Chronic gout involving toe without tophus, unspecified cause, unspecified laterality - has been without flares while on allopurinol and we discussed a trial off, he wishes to continue currently because he is going into a busy work season - allopurinol (ZYLOPRIM) 300 MG tablet; Take 1 tablet (300 mg total) by mouth daily.  Dispense: 90 tablet; Refill: 3  6. Need for Tdap vaccination - Tdap vaccine greater than or equal to 7yo IM  7. Elevated blood pressure reading - will recheck at follow up, discussed importance of weight loss and avoiding excessive sodium intake  8. Orgasm dysfunction - will try to get urology records, may need second opinon  9. Screening for HIV without presence of risk factors - HIV antibody; Future  10. Encounter for hepatitis C  screening test for low risk patient - Hepatitis C antibody; Future  11. Screening for prostate cancer - PSA; Future   Clarene Reamer, FNP-BC  [facility]  11/18/2016 5:22 PM

## 2016-11-21 ENCOUNTER — Encounter: Payer: Self-pay | Admitting: Family Medicine

## 2016-11-22 ENCOUNTER — Other Ambulatory Visit: Payer: Self-pay | Admitting: Oncology

## 2016-11-22 DIAGNOSIS — C187 Malignant neoplasm of sigmoid colon: Secondary | ICD-10-CM

## 2016-11-28 ENCOUNTER — Ambulatory Visit: Payer: Federal, State, Local not specified - PPO | Admitting: Family Medicine

## 2016-11-28 ENCOUNTER — Telehealth: Payer: Self-pay | Admitting: Oncology

## 2016-11-28 NOTE — Telephone Encounter (Signed)
lvm to inform pt of 3/9 appt at 0915 per LOS

## 2016-12-05 ENCOUNTER — Encounter (HOSPITAL_COMMUNITY)
Admission: RE | Admit: 2016-12-05 | Discharge: 2016-12-05 | Disposition: A | Discharge status: Home / Self Care | Payer: Federal, State, Local not specified - PPO | Source: Ambulatory Visit | Attending: Oncology | Admitting: Oncology

## 2016-12-05 DIAGNOSIS — C187 Malignant neoplasm of sigmoid colon: Secondary | ICD-10-CM | POA: Insufficient documentation

## 2016-12-05 DIAGNOSIS — R911 Solitary pulmonary nodule: Secondary | ICD-10-CM | POA: Diagnosis not present

## 2016-12-05 LAB — GLUCOSE, CAPILLARY: Glucose-Capillary: 130 mg/dL — ABNORMAL HIGH (ref 65–99)

## 2016-12-05 MED ORDER — FLUDEOXYGLUCOSE F - 18 (FDG) INJECTION
17.2300 | Freq: Once | INTRAVENOUS | Status: AC | PRN
  Administered 2016-12-05: 17.23 via INTRAVENOUS

## 2016-12-09 ENCOUNTER — Ambulatory Visit (HOSPITAL_BASED_OUTPATIENT_CLINIC_OR_DEPARTMENT_OTHER): Payer: Federal, State, Local not specified - PPO | Admitting: Nurse Practitioner

## 2016-12-09 ENCOUNTER — Telehealth: Payer: Self-pay | Admitting: Nurse Practitioner

## 2016-12-09 ENCOUNTER — Telehealth: Payer: Self-pay

## 2016-12-09 VITALS — BP 169/96 | HR 84 | Temp 98.3°F | Resp 20 | Wt 318.6 lb

## 2016-12-09 DIAGNOSIS — Z85038 Personal history of other malignant neoplasm of large intestine: Secondary | ICD-10-CM | POA: Diagnosis not present

## 2016-12-09 DIAGNOSIS — M109 Gout, unspecified: Secondary | ICD-10-CM

## 2016-12-09 DIAGNOSIS — I10 Essential (primary) hypertension: Secondary | ICD-10-CM | POA: Diagnosis not present

## 2016-12-09 DIAGNOSIS — C189 Malignant neoplasm of colon, unspecified: Secondary | ICD-10-CM

## 2016-12-09 NOTE — Telephone Encounter (Signed)
Gave patient AVS and schedule per 12/09/2016 los. Called Dr. Ardis Hughs office and spoke with Colletta Maryland, referral was in work que and sent to Dr. Garwin Brothers to follow up on when appt can be scheduled.

## 2016-12-09 NOTE — Progress Notes (Addendum)
[place] OFFICE PROGRESS NOTE   Diagnosis:  Colon cancer  INTERVAL HISTORY:   John Hubbard returns as scheduled. He overall feels well. He denies pain. He has a persistent cough. He reports the cough is related to "bronchitis".  Objective:  Vital signs in last 24 hours:  Blood pressure (!) 169/96, pulse 84, temperature 98.3 F (36.8 C), temperature source Oral, resp. rate 20, weight (!) 318 lb 9.6 oz (144.5 kg), SpO2 94 %.    HEENT: No thrush or ulcers. Resp: Lungs clear bilaterally. Cardio: Regular rate and rhythm. GI: Abdomen is soft and nontender. No hepatomegaly. Vascular: Trace bilateral pitting lower leg edema. Chronic stasis changes. No areas of active ulceration at the lower legs. There are several areas of scarring/healed ulceration. Skin: Mild confluent erythema over the abdominal wall.    Lab Results:  Lab Results  Component Value Date   WBC 6.5 10/28/2016   HGB 15.9 10/28/2016   HCT 47.5 10/28/2016   MCV 83.2 10/28/2016   PLT 186 10/28/2016   NEUTROABS 3.9 10/28/2016    Imaging:  No results found.  Medications: I have reviewed the patient's current medications.  Assessment/Plan: 1. 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 The tumor returned microsatellite stable with no loss of mismatch repair protein expression.  Cycle 1 adjuvant CAPOX beginning 09/18/2013.   Cycle 7 CAPOX 01/22/2014   Cycle 8 CAPOX 02/12/2014 (oxaliplatin deleted).  CT scans chest, abdomen and pelvis 08/15/2014 with no evidence of metastatic disease.  CT scans 11/15/2016-interval development of mediastinal adenopathy. Enlarging nodule within right upper lobe. No mass or adenopathy identified within the abdomen or pelvis.  PET scan 12/05/2016-FDG avid seen in the mediastinum and hila. Nodule in the medial right upper lobe increased since 2015, no abnormal FDG uptake in the region  of the nodule. Epicardial node inferior to the liver dome FDG avid. Porta hepatis and portacaval nodes FDG avid as well. Also FDG avid node anterior to the right side of the crus. 2. Port-A-Cath placement 09/16/2013. Port-A-Cath removed 05/14/2014. 3. Gout. 4. History of anxiety maintained on Tegretol. 5. Hypertension. 6. Delayed nausea. Improved with Aloxi/Emend 7. Rash-the rash occurred following cycle 7 CAPOX. It is possible the rash is related to capecitabine. Improved.  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   Disposition: John Hubbard appears stable. We reviewed the PET scan result from 12/05/2016. He and his wife understand the PET scan shows increased activity involving lymph nodes in the chest and upper abdomen. They further understand that this could indicate metastatic recurrence of the colon cancer versus another malignancy such as lymphoma versus infection versus other etiology such as sarcoidosis. Dr. Benay Spice recommends a referral to Dr. Ardis Hughs for EUS/biopsy of an abdominal lymph node. John Hubbard is in agreement with this plan. We will schedule a return visit on 12/20/2016 to review the biopsy result. He will contact the office in the interim with any problems.  Patient seen with Dr. Benay Spice. CT and PET images reviewed on the computer with John Hubbard and his wife. 30 minutes were spent face-to-face at today's visit with the majority of that time involved in counseling/coordination of care.    Ned Card ANP/GNP-BC   12/09/2016  11:08 AM  This was a shared visit with Ned Card. I presented his case at the GI tumor conference 12/07/2016. We discussed the CT and  PET findings with John Hubbard and his wife. We reviewed the images. We discussed the differential diagnosis. He agrees to an EUS biopsy of an abdominal lymph node with Dr. Ardis Hughs.  Julieanne Manson, M.D.

## 2016-12-09 NOTE — Telephone Encounter (Signed)
-----   Message from [name], MD sent at 12/09/2016 10:26 AM EST ----- Happy to.  Yigit Norkus, He needs upper EUS, radial +/- linear, next Thursday with MAC secation at [facility].  For abdominal adenopathy, h/o colon cancer. Thanks  dj  ----- Message ----- From: Ladell Pier, MD Sent: 12/09/2016  10:23 AM To: [name], MD  History of colon cancer with chest/abdominal lymphadenopathy, PET-positive, presented at conference 12/07/2016  I reviewed the PET with him today. Can you schedule him for an EUS biopsy?  Thanks,  [employer]

## 2016-12-13 ENCOUNTER — Telehealth: Payer: Self-pay | Admitting: *Deleted

## 2016-12-13 ENCOUNTER — Other Ambulatory Visit: Payer: Self-pay

## 2016-12-13 DIAGNOSIS — R599 Enlarged lymph nodes, unspecified: Secondary | ICD-10-CM

## 2016-12-13 DIAGNOSIS — Z85038 Personal history of other malignant neoplasm of large intestine: Secondary | ICD-10-CM

## 2016-12-13 DIAGNOSIS — R591 Generalized enlarged lymph nodes: Secondary | ICD-10-CM

## 2016-12-13 NOTE — Telephone Encounter (Signed)
Call from patient's wife.  "He was seen last Friday and told a procedure needs to be done on Thursday.  Nothing is on MyChart about this procedure.  We've taken time off work.  Will this happen on Thursday?"    Work in progress per [employer].  Provided Dr. Olena Heckle office number [phone].  No further questions.

## 2016-12-13 NOTE — Telephone Encounter (Signed)
EUS scheduled, pt instructed and medications reviewed.  Patient instructions mailed to home.  Patient to call with any questions or concerns.  

## 2016-12-14 ENCOUNTER — Encounter (HOSPITAL_COMMUNITY): Payer: Self-pay | Admitting: *Deleted

## 2016-12-15 ENCOUNTER — Ambulatory Visit (HOSPITAL_COMMUNITY): Payer: Federal, State, Local not specified - PPO | Admitting: Anesthesiology

## 2016-12-15 ENCOUNTER — Encounter (HOSPITAL_COMMUNITY)
Admission: RE | Disposition: A | Discharge status: Home / Self Care | Payer: Self-pay | Source: Ambulatory Visit | Attending: Gastroenterology

## 2016-12-15 ENCOUNTER — Ambulatory Visit (HOSPITAL_COMMUNITY)
Admission: RE | Admit: 2016-12-15 | Discharge: 2016-12-15 | Disposition: A | Discharge status: Home / Self Care | Payer: Federal, State, Local not specified - PPO | Source: Ambulatory Visit | Attending: Gastroenterology | Admitting: Gastroenterology

## 2016-12-15 ENCOUNTER — Encounter (HOSPITAL_COMMUNITY): Payer: Self-pay

## 2016-12-15 DIAGNOSIS — F419 Anxiety disorder, unspecified: Secondary | ICD-10-CM | POA: Insufficient documentation

## 2016-12-15 DIAGNOSIS — Z6841 Body Mass Index (BMI) 40.0 and over, adult: Secondary | ICD-10-CM | POA: Insufficient documentation

## 2016-12-15 DIAGNOSIS — Z9049 Acquired absence of other specified parts of digestive tract: Secondary | ICD-10-CM | POA: Diagnosis not present

## 2016-12-15 DIAGNOSIS — Z85038 Personal history of other malignant neoplasm of large intestine: Secondary | ICD-10-CM | POA: Diagnosis not present

## 2016-12-15 DIAGNOSIS — M109 Gout, unspecified: Secondary | ICD-10-CM | POA: Insufficient documentation

## 2016-12-15 DIAGNOSIS — C187 Malignant neoplasm of sigmoid colon: Secondary | ICD-10-CM | POA: Insufficient documentation

## 2016-12-15 DIAGNOSIS — I10 Essential (primary) hypertension: Secondary | ICD-10-CM | POA: Diagnosis not present

## 2016-12-15 DIAGNOSIS — Z9221 Personal history of antineoplastic chemotherapy: Secondary | ICD-10-CM | POA: Diagnosis not present

## 2016-12-15 DIAGNOSIS — R05 Cough: Secondary | ICD-10-CM | POA: Diagnosis not present

## 2016-12-15 DIAGNOSIS — F319 Bipolar disorder, unspecified: Secondary | ICD-10-CM | POA: Diagnosis not present

## 2016-12-15 DIAGNOSIS — R591 Generalized enlarged lymph nodes: Secondary | ICD-10-CM

## 2016-12-15 DIAGNOSIS — R590 Localized enlarged lymph nodes: Secondary | ICD-10-CM | POA: Diagnosis not present

## 2016-12-15 DIAGNOSIS — R599 Enlarged lymph nodes, unspecified: Secondary | ICD-10-CM

## 2016-12-15 HISTORY — PX: EUS: SHX5427

## 2016-12-15 HISTORY — DX: Other complications of anesthesia, initial encounter: T88.59XA

## 2016-12-15 HISTORY — DX: Personal history of other medical treatment: Z92.89

## 2016-12-15 HISTORY — DX: Adverse effect of unspecified anesthetic, initial encounter: T41.45XA

## 2016-12-15 LAB — GLUCOSE, CAPILLARY: Glucose-Capillary: 162 mg/dL — ABNORMAL HIGH (ref 65–99)

## 2016-12-15 SURGERY — UPPER ENDOSCOPIC ULTRASOUND (EUS) LINEAR
Anesthesia: General

## 2016-12-15 MED ORDER — MIDAZOLAM HCL 2 MG/2ML IJ SOLN
INTRAMUSCULAR | Status: AC
  Filled 2016-12-15: qty 2

## 2016-12-15 MED ORDER — ONDANSETRON HCL 4 MG/2ML IJ SOLN
INTRAMUSCULAR | Status: DC | PRN
  Administered 2016-12-15: 4 mg via INTRAVENOUS

## 2016-12-15 MED ORDER — SUCCINYLCHOLINE CHLORIDE 200 MG/10ML IV SOSY
PREFILLED_SYRINGE | INTRAVENOUS | Status: DC | PRN
  Administered 2016-12-15: 160 mg via INTRAVENOUS

## 2016-12-15 MED ORDER — LIDOCAINE 2% (20 MG/ML) 5 ML SYRINGE
INTRAMUSCULAR | Status: DC | PRN
  Administered 2016-12-15: 40 mg via INTRAVENOUS

## 2016-12-15 MED ORDER — SUGAMMADEX SODIUM 500 MG/5ML IV SOLN
INTRAVENOUS | Status: AC
  Filled 2016-12-15: qty 5

## 2016-12-15 MED ORDER — LACTATED RINGERS IV SOLN
INTRAVENOUS | Status: DC | PRN
  Administered 2016-12-15 (×2): via INTRAVENOUS

## 2016-12-15 MED ORDER — SUCCINYLCHOLINE CHLORIDE 200 MG/10ML IV SOSY
PREFILLED_SYRINGE | INTRAVENOUS | Status: AC
  Filled 2016-12-15: qty 10

## 2016-12-15 MED ORDER — LACTATED RINGERS IV SOLN
INTRAVENOUS | Status: DC
  Administered 2016-12-15: 1000 mL via INTRAVENOUS

## 2016-12-15 MED ORDER — ONDANSETRON HCL 4 MG/2ML IJ SOLN
INTRAMUSCULAR | Status: AC
  Filled 2016-12-15: qty 2

## 2016-12-15 MED ORDER — PROPOFOL 10 MG/ML IV BOLUS
INTRAVENOUS | Status: AC
  Filled 2016-12-15: qty 40

## 2016-12-15 MED ORDER — PROPOFOL 10 MG/ML IV BOLUS
INTRAVENOUS | Status: DC | PRN
  Administered 2016-12-15: 250 mg via INTRAVENOUS
  Administered 2016-12-15 (×4): 20 mg via INTRAVENOUS

## 2016-12-15 MED ORDER — SODIUM CHLORIDE 0.9 % IV SOLN: INTRAVENOUS | Status: DC

## 2016-12-15 NOTE — Anesthesia Procedure Notes (Signed)
Procedure Name: Intubation Date/Time: 12/15/2016 10:50 AM Performed by: Cynda Familia Pre-anesthesia Checklist: Patient identified, Emergency Drugs available, Suction available and Patient being monitored Patient Re-evaluated:Patient Re-evaluated prior to inductionOxygen Delivery Method: Circle System Utilized Preoxygenation: Pre-oxygenation with 100% oxygen Intubation Type: IV induction, Rapid sequence and Cricoid Pressure applied Ventilation: Mask ventilation without difficulty Laryngoscope Size: Miller and 2 Grade View: Grade I Tube type: Oral Number of attempts: 1 Airway Equipment and Method: Stylet Placement Confirmation: ETT inserted through vocal cords under direct vision,  positive ETCO2 and breath sounds checked- equal and bilateral Secured at: 22 cm Tube secured with: Tape Dental Injury: Teeth and Oropharynx as per pre-operative assessment  Comments: Smooth IV induction Tobias Alexander--- intubation AM CRNA atraumatic--- front teeth with irregular surfaces prior to the laryngoscopy--- teeth and moth as preop -- bite black by endo staff   bilat BS Tobias Alexander

## 2016-12-15 NOTE — H&P (View-Only) (Signed)
John Hubbard   Diagnosis:  Colon cancer  INTERVAL HISTORY:   John Hubbard returns as scheduled. He overall feels well. He denies pain. He has a persistent cough. He reports the cough is related to "bronchitis".  Objective:  Vital signs in last 24 hours:  Blood pressure (!) 169/96, pulse 84, temperature 98.3 F (36.8 C), temperature source Oral, resp. rate 20, weight (!) 318 lb 9.6 oz (144.5 kg), SpO2 94 %.    HEENT: No thrush or ulcers. Resp: Lungs clear bilaterally. Cardio: Regular rate and rhythm. GI: Abdomen is soft and nontender. No hepatomegaly. Vascular: Trace bilateral pitting lower leg edema. Chronic stasis changes. No areas of active ulceration at the lower legs. There are several areas of scarring/healed ulceration. Skin: Mild confluent erythema over the abdominal wall.    Lab Results:  Lab Results  Component Value Date   WBC 6.5 10/28/2016   HGB 15.9 10/28/2016   HCT 47.5 10/28/2016   MCV 83.2 10/28/2016   PLT 186 10/28/2016   NEUTROABS 3.9 10/28/2016    Imaging:  No results found.  Medications: I have reviewed the patient's current medications.  Assessment/Plan: 1. 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 The tumor returned microsatellite stable with no loss of mismatch repair protein expression.  Cycle 1 adjuvant CAPOX beginning 09/18/2013.   Cycle 7 CAPOX 01/22/2014   Cycle 8 CAPOX 02/12/2014 (oxaliplatin deleted).  CT scans chest, abdomen and pelvis 08/15/2014 with no evidence of metastatic disease.  CT scans 11/15/2016-interval development of mediastinal adenopathy. Enlarging nodule within right upper lobe. No mass or adenopathy identified within the abdomen or pelvis.  PET scan 12/05/2016-FDG avid seen in the mediastinum and hila. Nodule in the medial right upper lobe increased since 2015, no abnormal FDG uptake in the region  of the nodule. Epicardial node inferior to the liver dome FDG avid. Porta hepatis and portacaval nodes FDG avid as well. Also FDG avid node anterior to the right side of the crus. 2. Port-A-Cath placement 09/16/2013. Port-A-Cath removed 05/14/2014. 3. Gout. 4. History of anxiety maintained on Tegretol. 5. Hypertension. 6. Delayed nausea. Improved with Aloxi/Emend 7. Rash-the rash occurred following cycle 7 CAPOX. It is possible the rash is related to capecitabine. Improved.  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   Disposition: John Hubbard appears stable. We reviewed the PET scan result from 12/05/2016. He and his wife understand the PET scan shows increased activity involving lymph nodes in the chest and upper abdomen. They further understand that this could indicate metastatic recurrence of the colon cancer versus another malignancy such as lymphoma versus infection versus other etiology such as sarcoidosis. Dr. Benay Spice recommends a referral to Dr. Ardis Hughs for EUS/biopsy of an abdominal lymph node. John Hubbard is in agreement with this plan. We will schedule a return visit on 12/20/2016 to review the biopsy result. He will contact the office in the interim with any problems.  Patient seen with Dr. Benay Spice. CT and PET images reviewed on the computer with John Hubbard and his wife. 30 minutes were spent face-to-face at today's visit with the majority of that time involved in counseling/coordination of care.    Ned Card ANP/GNP-BC   12/09/2016  11:08 AM  This was a shared visit with Ned Card. I presented his case at the GI tumor conference 12/07/2016. We discussed the CT and  PET findings with John Hubbard and his wife. We reviewed the images. We discussed the differential diagnosis. He agrees to an EUS biopsy of an abdominal lymph node with Dr. Ardis Hughs.  Julieanne Manson, M.D.

## 2016-12-15 NOTE — Op Note (Signed)
[facility] Patient Name: John Hubbard Procedure Date: 12/15/2016 MRN: [id number] Attending MD: [name] , MD Date of Birth: [birth year] CSN: [id number] Age: 56 Admit Type: Outpatient Procedure:                Upper EUS Indications:              Lymphadenopathy on CT scan, Adenopathy seen on                            chest CT; 2014 node + colon cancer, treated with                            surgery and adjuvant chemo. Recent imaging shows                            mediastinal, porto/caval (and other) adenopathy. Providers:                [name], MD, Cleda Daub, RN, Cletis Athens, Technician Referring MD:             Julieanne Manson, MD Medicines:                General Anesthesia Complications:            No immediate complications. Estimated blood loss:                            None. Estimated Blood Loss:     Estimated blood loss: none. Procedure:                Pre-Anesthesia Assessment:                           - Prior to the procedure, a History and Physical                            was performed, and patient medications and                            allergies were reviewed. The patient's tolerance of                            previous anesthesia was also reviewed. The risks                            and benefits of the procedure and the sedation                            options and risks were discussed with the patient.                            All questions were answered, and informed consent  was obtained. Prior Anticoagulants: The patient has                            taken no previous anticoagulant or antiplatelet                            agents. ASA Grade Assessment: II - A patient with                            mild systemic disease. After reviewing the risks                            and benefits, the patient was deemed in                            satisfactory  condition to undergo the procedure.                           After obtaining informed consent, the endoscope was                            passed under direct vision. Throughout the                            procedure, the patient's blood pressure, pulse, and                            oxygen saturations were monitored continuously. The                            [id number] ([id number]) scope was introduced through                            the mouth, and advanced to the second part of                            duodenum. The upper EUS was accomplished without                            difficulty. The patient tolerated the procedure                            well. Findings:      Endoscopic Finding :      The examined esophagus was endoscopically normal.      The entire examined stomach was endoscopically normal.      The examined duodenum was endoscopically normal.      Endosonographic Finding :      1. One abnormal lymph node was visualized in the portocaval region. It       measured 20 mm in maximal cross-sectional diameter. The node was       triangular, hypoechoic and had well defined margins. Fine needle       aspiration for cytology was performed. Color Doppler imaging was       utilized prior to needle puncture  to confirm a lack of significant       vascular structures within the needle path. Four passes were made with       the 25 gauge needle using a transduodenal approach. A stylet was used. A       cytotechnologist was present to evaluate the adequacy of the specimen.       Final cytology results are pending.      2. I could not appreciate the mediastinal adenopathy described on recent       PET/CT scan      3. Limited views of liver, pancreas, spleen, portal and splenic vessels       were all normal. Impression:               - One abnormal lymph node was visualized in the                            portocaval region. Fine needle aspiration performed.                            - I could not appreciate the mediastinal, hilar or                            epicardial adenopathy described on recent PET/CT. Moderate Sedation:      N/A- Per Anesthesia Care Recommendation:           - Discharge patient to home (ambulatory).                           - Await cytology results. Procedure Code(s):        --- Professional ---                           [phone], Esophagogastroduodenoscopy, flexible,                            transoral; with transendoscopic ultrasound-guided                            intramural or transmural fine needle                            aspiration/biopsy(s), (includes endoscopic                            ultrasound examination limited to the esophagus,                            stomach or duodenum, and adjacent structures) Diagnosis Code(s):        --- Professional ---                           I89.9, Noninfective disorder of lymphatic vessels                            and lymph nodes, unspecified  R59.1, Generalized enlarged lymph nodes CPT copyright 2016 [employer]. All rights reserved. The codes documented in this report are preliminary and upon coder review may  be revised to meet current compliance requirements. [name], MD 12/15/2016 12:29:33 PM This report has been signed electronically. Number of Addenda: 0

## 2016-12-15 NOTE — Interval H&P Note (Signed)
History and Physical Interval Note:  12/15/2016 9:55 AM  John Asp  has presented today for surgery, with the diagnosis of abdominal adenopathy, h/o colon cancer  The various methods of treatment have been discussed with the patient and family. After consideration of risks, benefits and other options for treatment, the patient has consented to  Procedure(s): UPPER ENDOSCOPIC ULTRASOUND (EUS) LINEAR (N/A) as a surgical intervention .  The patient's history has been reviewed, patient examined, no change in status, stable for surgery.  I have reviewed the patient's chart and labs.  Questions were answered to the patient's satisfaction.     [name]

## 2016-12-15 NOTE — Anesthesia Postprocedure Evaluation (Addendum)
Anesthesia Post Note  Patient: John Hubbard  Procedure(s) Performed: Procedure(s) (LRB): UPPER ENDOSCOPIC ULTRASOUND (EUS) LINEAR (N/A)  Patient location during evaluation: PACU Anesthesia Type: General Level of consciousness: sedated Pain management: pain level controlled Vital Signs Assessment: post-procedure vital signs reviewed and stable Respiratory status: spontaneous breathing and respiratory function stable Cardiovascular status: stable Anesthetic complications: no       Last Vitals:  Vitals:   12/15/16 1428 12/15/16 1430  BP: (!) 155/84   Pulse: 96 99  Resp: 12 15  Temp:      Last Pain:  Vitals:   12/15/16 1415  PainSc: 0-No pain                 Khoi Hamberger DANIEL

## 2016-12-15 NOTE — Transfer of Care (Signed)
Immediate Anesthesia Transfer of Care Note  Patient: John Hubbard  Procedure(s) Performed: Procedure(s): UPPER ENDOSCOPIC ULTRASOUND (EUS) LINEAR (N/A)  Patient Location: PACU  Anesthesia Type:General  Level of Consciousness: awake and sedated  Airway & Oxygen Therapy: Patient Spontanous Breathing and Patient connected to face mask oxygen  Post-op Assessment: Report given to RN and Post -op Vital signs reviewed and stable  Post vital signs: Reviewed and stable  Last Vitals:  Vitals:   12/15/16 0945  BP: (!) 174/94  Pulse: 81  Resp: (!) 22  Temp: 36.7 C    Last Pain: There were no vitals filed for this visit.       Complications: No apparent anesthesia complications

## 2016-12-15 NOTE — Discharge Instructions (Signed)

## 2016-12-15 NOTE — Anesthesia Preprocedure Evaluation (Signed)
Anesthesia Evaluation  Patient identified by MRN, date of birth, ID band Patient awake    Reviewed: Allergy & Precautions, NPO status , Patient's Chart, lab work & pertinent test results  History of Anesthesia Complications Negative for: history of anesthetic complications  Airway Mallampati: III  TM Distance: >3 FB Neck ROM: Full    Dental no notable dental hx. (+) Dental Advisory Given   Pulmonary neg pulmonary ROS,    Pulmonary exam normal        Cardiovascular negative cardio ROS Normal cardiovascular exam     Neuro/Psych PSYCHIATRIC DISORDERS Anxiety Bipolar Disorder negative neurological ROS     GI/Hepatic Neg liver ROS,   Endo/Other  Morbid obesity  Renal/GU negative Renal ROS     Musculoskeletal   Abdominal   Peds  Hematology   Anesthesia Other Findings   Reproductive/Obstetrics                             Anesthesia Physical Anesthesia Plan  ASA: III  Anesthesia Plan: MAC   Post-op Pain Management:    Induction:   Airway Management Planned: Natural Airway and Nasal Cannula  Additional Equipment:   Intra-op Plan:   Post-operative Plan:   Informed Consent: I have reviewed the patients History and Physical, chart, labs and discussed the procedure including the risks, benefits and alternatives for the proposed anesthesia with the patient or authorized representative who has indicated his/her understanding and acceptance.   Dental advisory given  Plan Discussed with: CRNA and Anesthesiologist  Anesthesia Plan Comments:         Anesthesia Quick Evaluation

## 2016-12-20 ENCOUNTER — Institutional Professional Consult (permissible substitution): Payer: Federal, State, Local not specified - PPO | Admitting: Neurology

## 2016-12-20 ENCOUNTER — Ambulatory Visit (HOSPITAL_BASED_OUTPATIENT_CLINIC_OR_DEPARTMENT_OTHER): Payer: Federal, State, Local not specified - PPO | Admitting: Oncology

## 2016-12-20 ENCOUNTER — Telehealth: Payer: Self-pay | Admitting: Oncology

## 2016-12-20 ENCOUNTER — Encounter: Payer: Self-pay | Admitting: *Deleted

## 2016-12-20 VITALS — BP 152/86 | HR 85 | Temp 98.3°F | Resp 18 | Wt 322.4 lb

## 2016-12-20 DIAGNOSIS — R911 Solitary pulmonary nodule: Secondary | ICD-10-CM

## 2016-12-20 DIAGNOSIS — I10 Essential (primary) hypertension: Secondary | ICD-10-CM | POA: Diagnosis not present

## 2016-12-20 DIAGNOSIS — C189 Malignant neoplasm of colon, unspecified: Secondary | ICD-10-CM

## 2016-12-20 DIAGNOSIS — F419 Anxiety disorder, unspecified: Secondary | ICD-10-CM | POA: Diagnosis not present

## 2016-12-20 DIAGNOSIS — R599 Enlarged lymph nodes, unspecified: Secondary | ICD-10-CM

## 2016-12-20 DIAGNOSIS — Z85038 Personal history of other malignant neoplasm of large intestine: Secondary | ICD-10-CM

## 2016-12-20 NOTE — Telephone Encounter (Signed)
Appointments scheduled per 3.20.18 LOS. Patient given AVS report and calendars with future scheduled appointments.  °

## 2016-12-20 NOTE — Progress Notes (Signed)
  [facility] OFFICE PROGRESS NOTE   Diagnosis: Colon cancer  INTERVAL HISTORY:   John Hubbard returns as scheduled. He underwent an EUS biopsy of a 20 mm portacaval node by Dr. Ardis Hughs on 12/15/2016. Mediastinal adenopathy was not noted. The pathology returned with scant lymphoid material. No malignant cells were identified.  He reports mild abdominal soreness following the procedure. He otherwise feels well.  Objective:  Vital signs in last 24 hours:  Blood pressure (!) 152/86, pulse 85, temperature 98.3 F (36.8 C), temperature source Oral, resp. rate 18, weight (!) 322 lb 7 oz (146.3 kg), SpO2 94 %.    Lymphatics: No cervical or supraclavicular nodes Resp: Lungs clear bilaterally Cardio: Regular rate and rhythm GI: No hepatosplenomegaly, no mass, nontender Vascular: No leg edema    Medications: I have reviewed the patient's current medications.  Assessment/Plan: 1. 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 The tumor returned microsatellite stable with no loss of mismatch repair protein expression.  Cycle 1 adjuvant CAPOX beginning 09/18/2013.   Cycle 7 CAPOX 01/22/2014   Cycle 8 CAPOX 02/12/2014 (oxaliplatin deleted).  CT scans chest, abdomen and pelvis 08/15/2014 with no evidence of metastatic disease.  CT scans 11/15/2016-interval development of mediastinal adenopathy. Enlarging nodule within right upper lobe. No mass or adenopathy identified within the abdomen or pelvis.  PET scan 12/05/2016-FDG avid seen in the mediastinum and hila. Nodule in the medial right upper lobe increased since 2015, no abnormal FDG uptake in the region of the nodule. Epicardial node inferior to the liver dome FDG avid. Porta hepatis and portacaval nodes FDG avid as well. Also FDG avid node anterior to the right side of the crus.  EUS biopsy of a portacaval node on  12/15/2016-nondiagnostic 2. Port-A-Cath placement 09/16/2013. Port-A-Cath removed 05/14/2014. 3. Gout. 4. History of anxiety maintained on Tegretol. 5. Hypertension. 6. Delayed nausea. Improved with Aloxi/Emend 7. Rash-the rash occurred following cycle 7 CAPOX. It is possible the rash is related to capecitabine. Improved.  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    Disposition:  John Hubbard appears well. The EUS biopsy of a portacaval node was nondiagnostic. I discussed the case with Dr. Ardis Hughs. He reports it was very difficult to locate and biopsy this lymph node. I discussed the differential diagnosis with John Hubbard and his wife. He understands the negative biopsy does not mean the lymph node is not involved with a malignant process. The differential diagnosis includes metastatic colon cancer, lymphoma, another metastatic tumor, and a benign process such as sarcoidosis.  I will make a referral to [facility] asked to consider a biopsy via bronchoscopy/EBUS or a mediastinoscopy.  John Hubbard will return for an office visit on 01/16/2017.  Betsy Coder, MD  12/20/2016  5:29 PM

## 2016-12-20 NOTE — Progress Notes (Signed)
Oncology Nurse Navigator Documentation  Oncology Nurse Navigator Flowsheets 12/20/2016  Navigator Location [facility]  Navigator Encounter Type Other/I met Mr and Ms. Margart Sickles.  Per Dr. Benay Spice he would like patient to be seen by Dr. Servando Snare or Dr. Roxan Hockey for tissue DX of lung mass and adenopathy.  I gave patient a map and business card for [facility] office.  I also contacted new patient coordinator, Jenny Reichmann at [facility] with referral.    Patient Visit Type MedOnc  Treatment Phase Abnormal Scans  Barriers/Navigation Needs Coordination of Care  Interventions Coordination of Care  Coordination of Care Appts  Acuity Level 2  Acuity Level 2 Educational needs;Assistance expediting appointments  Time Spent with Patient 30

## 2016-12-23 ENCOUNTER — Other Ambulatory Visit: Payer: Self-pay

## 2016-12-23 ENCOUNTER — Encounter: Payer: Self-pay | Admitting: Cardiothoracic Surgery

## 2016-12-23 ENCOUNTER — Institutional Professional Consult (permissible substitution) (INDEPENDENT_AMBULATORY_CARE_PROVIDER_SITE_OTHER): Payer: Federal, State, Local not specified - PPO | Admitting: Cardiothoracic Surgery

## 2016-12-23 VITALS — BP 150/83 | HR 90 | Resp 20 | Ht 70.0 in | Wt 315.0 lb

## 2016-12-23 DIAGNOSIS — R591 Generalized enlarged lymph nodes: Secondary | ICD-10-CM | POA: Diagnosis not present

## 2016-12-23 DIAGNOSIS — R918 Other nonspecific abnormal finding of lung field: Secondary | ICD-10-CM

## 2016-12-23 DIAGNOSIS — R599 Enlarged lymph nodes, unspecified: Secondary | ICD-10-CM

## 2016-12-23 NOTE — Progress Notes (Signed)
[address]       [place], [address]             [phone]                    John Hubbard [place] Medical Record #[id number] Date of Birth: 1961/09/13  Referring: Ladell Pier, MD Primary Care: Briscoe Deutscher, DO  Chief Complaint:    Chief Complaint  Patient presents with  . Lung Mass    surgical eval, Chest/ABD/Pelvis CT 11/16/16, PET Scan 12/05/16    History of Present Illness:    John Hubbard 56 y.o. male is seen in the office  today for mediastinal adenopathy  with history of stage T3N1 colon cancer 08/2013. Recent ct of achest and pet scan done. attemp at transesophageal biopsy of abdominal nodes was not diagnostic. Patient denies chest symptoms.    Current Activity/ Functional Status:  Patient is independent with mobility/ambulation, transfers, ADL's, IADL's.   Zubrod Score: At the time of surgery this patient's most appropriate activity status/level should be described as: [x]     0    Normal activity, no symptoms []     1    Restricted in physical strenuous activity but ambulatory, able to do out light work []     2    Ambulatory and capable of self care, unable to do work activities, up and about               >50 % of waking hours                              []     3    Only limited self care, in bed greater than 50% of waking hours []     4    Completely disabled, no self care, confined to bed or chair []     5    Moribund   Past Medical History:  Diagnosis Date  . Anxiety   . Cancer ([place])    hx colon cancer, surgery, chemotherapy -last 5'15  . Complication of anesthesia    WOKE UP DURING 1 COLONSCOPY  . Gout   . History of bipolar disorder    NO CURRENY BIPOLAR MEDS  . History of blood transfusion 2015  . History of colon cancer   . History of gout    10-17-14 no recent issues  . Numbness and tingling of both feet     Past Surgical History:  Procedure Laterality Date  . COLONOSCOPY Bilateral 07/24/2013   Procedure:  COLONOSCOPY;  Surgeon: [name], MD;  Location: [facility];  Service: Endoscopy;  Laterality: Bilateral;  . COLONOSCOPY WITH PROPOFOL N/A 10/23/2014   Procedure: COLONOSCOPY WITH PROPOFOL;  Surgeon: [name], MD;  Location: [facility];  Service: Endoscopy;  Laterality: N/A;  . EUS N/A 12/15/2016   Procedure: UPPER ENDOSCOPIC ULTRASOUND (EUS) LINEAR;  Surgeon: [name], MD;  [facility];  Service: Endoscopy;  Laterality: N/A;  . HERNIA REPAIR  01/01/01   Umbilical Hernia Repair  . LAPAROSCOPIC PARTIAL COLECTOMY N/A 08/14/2013   Procedure: LAPAROSCOPIC PARTIAL COLECTOMY wound vac placement;  Surgeon: [name], MD;  Location: [facility];  Service: General;  Laterality: N/A;  . PORT-A-CATH REMOVAL N/A 05/14/2014   Procedure: REMOVAL PORT-A-CATH;  Surgeon: [name], MD;  Location: [facility];  Service: General;  Laterality: N/A;  . PORTACATH PLACEMENT Left 09/16/2013  Procedure: INSERTION PORT-A-CATH;  Surgeon: [name], MD;  Location: [facility];  Service: General;  Laterality: Left;  dressing change on lower abdominal wound    Family History  Problem Relation Age of Onset  . Heart disease Father     Social History   Social History  . Marital status: Married    Spouse name: N/A  . Number of children: N/A  . Years of education: N/A   Occupational History  . Not on file.   Social History Main Topics  . Smoking status: Never Smoker  . Smokeless tobacco: Never Used  . Alcohol use Yes     Comment: rare  . Drug use: No  . Sexual activity: Not on file   Other Topics Concern  . Not on file   Social History Narrative  . No narrative on file    History  Smoking Status  . Never Smoker  Smokeless Tobacco  . Never Used    History  Alcohol Use  . Yes    Comment: rare     Allergies  Allergen Reactions  . Penicillins Hives    Has patient had a PCN reaction causing immediate rash, facial/tongue/throat swelling, SOB or  lightheadedness with hypotension: No Has patient had a PCN reaction causing severe rash involving mucus membranes or skin necrosis: Yes Has patient had a PCN reaction that required hospitalization Yes Has patient had a PCN reaction occurring within the last 10 years: No If all of the above answers are "NO", then may proceed with Cephalosporin use.     Current Outpatient Prescriptions  Medication Sig Dispense Refill  . allopurinol (ZYLOPRIM) 300 MG tablet Take 1 tablet (300 mg total) by mouth daily. (Patient taking differently: Take 300 mg by mouth every evening. ) 90 tablet 3  . carbamazepine (TEGRETOL) 200 MG tablet Take 200 mg by mouth 2 (two) times daily.    . colchicine 0.6 MG tablet Take 0.6 mg by mouth 2 (two) times daily as needed (Gout).      No current facility-administered medications for this visit.       Review of Systems:     Cardiac Review of Systems: Y or N  Chest Pain [ n   ]  Resting SOB [n   ] Exertional SOB  [n  ]  Orthopnea [ n ]   Pedal Edema [ n  ]    Palpitations [n ] Syncope  [n ]   Presyncope [ n  ]  General Review of Systems: [Y] = yes [  ]=no Constitional: recent weight change [  ];  Wt loss over the last 3 months [   ] anorexia [  ]; fatigue [  ]; nausea [  ]; night sweats [  ]; fever [  ]; or chills [  ];          Dental: poor dentition[  ]; Last Dentist visit:   Eye : blurred vision [  ]; diplopia [   ]; vision changes [  ];  Amaurosis fugax[  ]; Resp: cough [n  ];  wheezing[ n];  hemoptysis[n]; shortness of breath[ n ]; paroxysmal nocturnal dyspnea[  ]; dyspnea on exertion[  ]; or orthopnea[  ];  GI:  gallstones[  ], vomiting[  ];  dysphagia[  ]; melena[  ];  hematochezia [nsthesias[  ];  difficulty walking[n ];  Psych:depression[  ]; anxiety[  ];  Endocrine: diabetes[n ];  thyroid dysfunction[  ];  Immunizations: Flu up to date [  ];  Pneumococcal up to date [  ];  Other:  Physical Exam: BP (!) 150/83   Pulse 90   Resp 20   Ht 5\' 10"  (1.778 m)    Wt (!) 315 lb (142.9 kg)   SpO2 97% Comment: RA  BMI 45.20 kg/m   PHYSICAL EXAMINATION: General appearance: alert, cooperative and no distress Head: Normocephalic, without obvious abnormality, atraumatic Neck: no adenopathy, no carotid bruit, no JVD, supple, symmetrical, trachea midline and thyroid not enlarged, symmetric, no tenderness/mass/nodules Lymph nodes: Cervical, supraclavicular, and axillary nodes normal. Resp: clear to auscultation bilaterally Back: symmetric, no curvature. ROM normal. No CVA tenderness. Cardio: regular rate and rhythm, S1, S2 normal, no murmur, click, rub or gallop GI: soft, non-tender; bowel sounds normal; no masses,  no organomegaly Extremities: extremities normal, atraumatic, no cyanosis or edema and Homans sign is negative, no sign of DVT Neurologic: Grossly normal  Diagnostic Studies & Laboratory data:     Recent Radiology Findings:   Nm Pet Image Initial (pi) Skull Base To Thigh  Result Date: 12/05/2016 CLINICAL DATA:  New adenopathy in the chest and new pulmonary nodule with history of colon cancer diagnosed in 2014. This is the patient's first PET-CT. EXAM: NUCLEAR MEDICINE PET SKULL BASE TO THIGH TECHNIQUE: 17.2 mCi F-18 FDG was injected intravenously. Full-ring PET imaging was performed from the skull base to thigh after the radiotracer. CT data was obtained and used for attenuation correction and anatomic localization. FASTING BLOOD GLUCOSE:  Value: 130 mg/dl COMPARISON:  CT scan November 15, 2016 FINDINGS: NECK There is some muscular uptake around the upper cervical spine. No convincing evidence of FDG avid disease seen in the neck. Uptake in the oropharynx is symmetric and likely physiologic. CHEST FDG avid nodes are seen in the mediastinum and hila. The representative right paratracheal node on CT image 69 demonstrates a maximum SUV of 7. The nodule in the medial right upper lobe is seen on series 4, image 75. By report, this nodule has grown since 2015  which is suspicious. However, there is no abnormal FDG uptake in this region of the nodule, likely simply too small to characterize with PET imaging. ABDOMEN/PELVIS There is an epicardial nodes seen on series 4, image 94, inferior to the liver dome which is FDG avid. The maximum SUV is 7.2. Porta hepatis and portacaval nodes are FDG avid as well. There is also an FDG avid node anterior to the right side of the crus on series 4, image 117. The representative portacaval node seen on CT image 123 demonstrates a maximum SUV of 19.8. No other FDG-avid malignancy seen in the abdomen or pelvis. A focus of uptake in the sigmoid colon is likely physiologic. SKELETON No focal hypermetabolic activity to suggest skeletal metastasis. IMPRESSION: 1. FDG avid metastatic disease in the mediastinum and hila. FDG avid metastatic nodes in the porta hepatis and portacaval region. There is also a metastatic node just anterior to the right crus in the upper abdomen and another FDG avid metastatic epicardial lymph node. No other convincing evidence of metastatic disease elsewhere. 2. The growing nodule in the medial right upper lobe is suspicious but too small to characterize with PET imaging. Electronically Signed   By: Dorise Bullion III M.D   On: 12/05/2016 16:32    I have independently reviewed the above radiology studies  and reviewed the findings with the patient.   Recent Lab Findings: Lab Results  Component Value Date   WBC 6.5 10/28/2016   HGB 15.9 10/28/2016  HCT 47.5 10/28/2016   PLT 186 10/28/2016   GLUCOSE 134 10/28/2016   ALT 31 10/28/2016   AST 20 10/28/2016   NA 140 10/28/2016   K 4.4 10/28/2016   CL 96 08/17/2013   CREATININE 0.9 10/28/2016   BUN 16.1 10/28/2016   CO2 28 10/28/2016      Assessment / Plan:   FDG avid mediastinal adenopathy and bilaterial hila- concern for metastatic  disease but pattern may be lymphoma rather then metastatic colon cancer I have recommended proceeding with  Bronchoscopy/EBUS possible Mediastinoscopyy to confirm tissue diagnosis . Risks and options discussed with the patient. Plan early next week.       I  spent 40 minutes counseling the patient face to face and 50% or more the  time was spent in counseling and coordination of care. The total time spent in the appointment was 60 minutes.  [name] MD      [address] [place] [address] Office [phone]   Beeper [phone]  12/23/2016 3:35 PM

## 2016-12-27 ENCOUNTER — Encounter (HOSPITAL_COMMUNITY): Payer: Self-pay | Admitting: *Deleted

## 2016-12-27 NOTE — Progress Notes (Signed)
   12/27/16 1737  OBSTRUCTIVE SLEEP APNEA  Have you ever been diagnosed with sleep apnea through a sleep study? No  Do you snore loudly (loud enough to be heard through closed doors)?  1  Do you often feel tired, fatigued, or sleepy during the daytime (such as falling asleep during driving or talking to someone)? 0  Has anyone observed you stop breathing during your sleep? 1  Do you have, or are you being treated for high blood pressure? 0  BMI more than 35 kg/m2? 1  Age > 50 (1-yes) 1  Neck circumference greater than:Male 16 inches or larger, Male 17inches or larger? 1  Male Gender (Yes=1) 1  Obstructive Sleep Apnea Score 6

## 2016-12-27 NOTE — Progress Notes (Signed)
Pt denies any acute cardiopulmonary issues. Pt denies being under the care of a cardiologist. Pt denies having a stress test, echo and cardiac cath. Pt denies having a recent chest x ray, EKG and labs. Pt made aware to stop taking Aspirin, vitamins, fish oil and herbal medications. Do not take any NSAIDs ie: Ibuprofen, Advil, Naproxen, BC and Goody Powder or any medication containing Aspirin. Pt verbalized understanding of all pre-op instructions.

## 2016-12-28 ENCOUNTER — Ambulatory Visit (HOSPITAL_COMMUNITY): Payer: Federal, State, Local not specified - PPO | Admitting: Certified Registered Nurse Anesthetist

## 2016-12-28 ENCOUNTER — Ambulatory Visit (HOSPITAL_COMMUNITY)
Admission: RE | Admit: 2016-12-28 | Discharge: 2016-12-28 | Disposition: A | Discharge status: Home / Self Care | Payer: Federal, State, Local not specified - PPO | Source: Ambulatory Visit | Attending: Cardiothoracic Surgery | Admitting: Cardiothoracic Surgery

## 2016-12-28 ENCOUNTER — Encounter (HOSPITAL_COMMUNITY): Payer: Self-pay | Admitting: Urology

## 2016-12-28 ENCOUNTER — Encounter (HOSPITAL_COMMUNITY)
Admission: RE | Disposition: A | Discharge status: Home / Self Care | Payer: Self-pay | Source: Ambulatory Visit | Attending: Cardiothoracic Surgery

## 2016-12-28 ENCOUNTER — Ambulatory Visit (HOSPITAL_COMMUNITY): Payer: Federal, State, Local not specified - PPO

## 2016-12-28 DIAGNOSIS — R918 Other nonspecific abnormal finding of lung field: Secondary | ICD-10-CM | POA: Diagnosis not present

## 2016-12-28 DIAGNOSIS — Z880 Allergy status to penicillin: Secondary | ICD-10-CM | POA: Diagnosis not present

## 2016-12-28 DIAGNOSIS — Z9049 Acquired absence of other specified parts of digestive tract: Secondary | ICD-10-CM | POA: Diagnosis not present

## 2016-12-28 DIAGNOSIS — Z9221 Personal history of antineoplastic chemotherapy: Secondary | ICD-10-CM | POA: Insufficient documentation

## 2016-12-28 DIAGNOSIS — R599 Enlarged lymph nodes, unspecified: Secondary | ICD-10-CM

## 2016-12-28 DIAGNOSIS — R590 Localized enlarged lymph nodes: Secondary | ICD-10-CM | POA: Insufficient documentation

## 2016-12-28 DIAGNOSIS — M109 Gout, unspecified: Secondary | ICD-10-CM | POA: Diagnosis not present

## 2016-12-28 DIAGNOSIS — F419 Anxiety disorder, unspecified: Secondary | ICD-10-CM | POA: Insufficient documentation

## 2016-12-28 DIAGNOSIS — R591 Generalized enlarged lymph nodes: Secondary | ICD-10-CM

## 2016-12-28 DIAGNOSIS — F319 Bipolar disorder, unspecified: Secondary | ICD-10-CM | POA: Insufficient documentation

## 2016-12-28 DIAGNOSIS — R200 Anesthesia of skin: Secondary | ICD-10-CM | POA: Diagnosis not present

## 2016-12-28 DIAGNOSIS — I517 Cardiomegaly: Secondary | ICD-10-CM | POA: Diagnosis not present

## 2016-12-28 DIAGNOSIS — Z8249 Family history of ischemic heart disease and other diseases of the circulatory system: Secondary | ICD-10-CM | POA: Insufficient documentation

## 2016-12-28 DIAGNOSIS — C189 Malignant neoplasm of colon, unspecified: Secondary | ICD-10-CM | POA: Insufficient documentation

## 2016-12-28 DIAGNOSIS — I888 Other nonspecific lymphadenitis: Secondary | ICD-10-CM | POA: Diagnosis not present

## 2016-12-28 HISTORY — PX: VIDEO BRONCHOSCOPY WITH ENDOBRONCHIAL ULTRASOUND: SHX6177

## 2016-12-28 HISTORY — PX: MEDIASTINOSCOPY: SHX5086

## 2016-12-28 LAB — COMPREHENSIVE METABOLIC PANEL
ALK PHOS: 128 U/L — AB (ref 38–126)
ALT: 27 U/L (ref 17–63)
ANION GAP: 8 (ref 5–15)
AST: 21 U/L (ref 15–41)
Albumin: 3.8 g/dL (ref 3.5–5.0)
BILIRUBIN TOTAL: 0.5 mg/dL (ref 0.3–1.2)
BUN: 18 mg/dL (ref 6–20)
CALCIUM: 9.2 mg/dL (ref 8.9–10.3)
CO2: 29 mmol/L (ref 22–32)
CREATININE: 0.84 mg/dL (ref 0.61–1.24)
Chloride: 101 mmol/L (ref 101–111)
Glucose, Bld: 146 mg/dL — ABNORMAL HIGH (ref 65–99)
Potassium: 4.2 mmol/L (ref 3.5–5.1)
SODIUM: 138 mmol/L (ref 135–145)
TOTAL PROTEIN: 7.2 g/dL (ref 6.5–8.1)

## 2016-12-28 LAB — TYPE AND SCREEN
ABO/RH(D): O POS
Antibody Screen: NEGATIVE

## 2016-12-28 LAB — GLUCOSE, CAPILLARY: Glucose-Capillary: 225 mg/dL — ABNORMAL HIGH (ref 65–99)

## 2016-12-28 LAB — ABO/RH: ABO/RH(D): O POS

## 2016-12-28 LAB — CBC
HCT: 47.7 % (ref 39.0–52.0)
HEMOGLOBIN: 16 g/dL (ref 13.0–17.0)
MCH: 28.4 pg (ref 26.0–34.0)
MCHC: 33.5 g/dL (ref 30.0–36.0)
MCV: 84.6 fL (ref 78.0–100.0)
Platelets: 189 10*3/uL (ref 150–400)
RBC: 5.64 MIL/uL (ref 4.22–5.81)
RDW: 12.8 % (ref 11.5–15.5)
WBC: 6.7 10*3/uL (ref 4.0–10.5)

## 2016-12-28 LAB — SURGICAL PCR SCREEN
MRSA, PCR: NEGATIVE
Staphylococcus aureus: NEGATIVE

## 2016-12-28 LAB — PROTIME-INR
INR: 0.94
PROTHROMBIN TIME: 12.6 s (ref 11.4–15.2)

## 2016-12-28 LAB — APTT: aPTT: 26 seconds (ref 24–36)

## 2016-12-28 SURGERY — BRONCHOSCOPY, WITH EBUS
Anesthesia: General

## 2016-12-28 MED ORDER — MUPIROCIN 2 % EX OINT: 1.0000 "application " | TOPICAL_OINTMENT | Freq: Once | CUTANEOUS | Status: DC

## 2016-12-28 MED ORDER — TRAMADOL HCL 50 MG PO TABS: 50.0000 mg | ORAL_TABLET | Freq: Four times a day (QID) | ORAL | 0 refills | Status: DC | PRN

## 2016-12-28 MED ORDER — HYDROMORPHONE HCL 1 MG/ML IJ SOLN: 0.2500 mg | INTRAMUSCULAR | Status: DC | PRN

## 2016-12-28 MED ORDER — FENTANYL CITRATE (PF) 100 MCG/2ML IJ SOLN
INTRAMUSCULAR | Status: DC | PRN
  Administered 2016-12-28 (×2): 50 ug via INTRAVENOUS

## 2016-12-28 MED ORDER — LIDOCAINE 2% (20 MG/ML) 5 ML SYRINGE
INTRAMUSCULAR | Status: DC | PRN
  Administered 2016-12-28: 100 mg via INTRAVENOUS

## 2016-12-28 MED ORDER — SUCCINYLCHOLINE CHLORIDE 200 MG/10ML IV SOSY
PREFILLED_SYRINGE | INTRAVENOUS | Status: DC | PRN
  Administered 2016-12-28: 140 mg via INTRAVENOUS

## 2016-12-28 MED ORDER — ROCURONIUM BROMIDE 50 MG/5ML IV SOSY
PREFILLED_SYRINGE | INTRAVENOUS | Status: AC
  Filled 2016-12-28: qty 5

## 2016-12-28 MED ORDER — MUPIROCIN 2 % EX OINT
TOPICAL_OINTMENT | CUTANEOUS | Status: AC
  Administered 2016-12-28: 1
  Filled 2016-12-28: qty 22

## 2016-12-28 MED ORDER — SUGAMMADEX SODIUM 200 MG/2ML IV SOLN
INTRAVENOUS | Status: DC | PRN
  Administered 2016-12-28: 285 mg via INTRAVENOUS

## 2016-12-28 MED ORDER — DEXAMETHASONE SODIUM PHOSPHATE 10 MG/ML IJ SOLN
INTRAMUSCULAR | Status: DC | PRN
  Administered 2016-12-28: 10 mg via INTRAVENOUS

## 2016-12-28 MED ORDER — DEXTROSE 5 % IV SOLN
INTRAVENOUS | Status: DC | PRN
  Administered 2016-12-28: 25 ug/min via INTRAVENOUS

## 2016-12-28 MED ORDER — SUGAMMADEX SODIUM 500 MG/5ML IV SOLN
INTRAVENOUS | Status: AC
  Filled 2016-12-28: qty 5

## 2016-12-28 MED ORDER — PROPOFOL 10 MG/ML IV BOLUS
INTRAVENOUS | Status: AC
  Filled 2016-12-28: qty 20

## 2016-12-28 MED ORDER — ROCURONIUM BROMIDE 100 MG/10ML IV SOLN
INTRAVENOUS | Status: DC | PRN
  Administered 2016-12-28 (×3): 10 mg via INTRAVENOUS
  Administered 2016-12-28: 50 mg via INTRAVENOUS
  Administered 2016-12-28: 20 mg via INTRAVENOUS
  Administered 2016-12-28 (×2): 10 mg via INTRAVENOUS
  Administered 2016-12-28 (×3): 20 mg via INTRAVENOUS

## 2016-12-28 MED ORDER — LACTATED RINGERS IV SOLN
INTRAVENOUS | Status: DC
  Administered 2016-12-28 (×2): via INTRAVENOUS

## 2016-12-28 MED ORDER — MIDAZOLAM HCL 2 MG/2ML IJ SOLN
INTRAMUSCULAR | Status: AC
  Filled 2016-12-28: qty 2

## 2016-12-28 MED ORDER — CHLORHEXIDINE GLUCONATE 4 % EX LIQD: 60.0000 mL | Freq: Once | CUTANEOUS | Status: DC

## 2016-12-28 MED ORDER — ONDANSETRON HCL 4 MG/2ML IJ SOLN
INTRAMUSCULAR | Status: AC
  Filled 2016-12-28: qty 2

## 2016-12-28 MED ORDER — LIDOCAINE 2% (20 MG/ML) 5 ML SYRINGE
INTRAMUSCULAR | Status: AC
  Filled 2016-12-28: qty 5

## 2016-12-28 MED ORDER — ALBUTEROL SULFATE HFA 108 (90 BASE) MCG/ACT IN AERS
INHALATION_SPRAY | RESPIRATORY_TRACT | Status: DC | PRN
  Administered 2016-12-28: 6 via RESPIRATORY_TRACT

## 2016-12-28 MED ORDER — PHENYLEPHRINE 40 MCG/ML (10ML) SYRINGE FOR IV PUSH (FOR BLOOD PRESSURE SUPPORT)
PREFILLED_SYRINGE | INTRAVENOUS | Status: DC | PRN
  Administered 2016-12-28 (×2): 120 ug via INTRAVENOUS

## 2016-12-28 MED ORDER — EPINEPHRINE PF 1 MG/ML IJ SOLN
INTRAMUSCULAR | Status: AC
  Filled 2016-12-28: qty 1

## 2016-12-28 MED ORDER — VANCOMYCIN HCL 10 G IV SOLR
1500.0000 mg | INTRAVENOUS | Status: AC
  Administered 2016-12-28: 1500 mg via INTRAVENOUS
  Filled 2016-12-28 (×2): qty 1500

## 2016-12-28 MED ORDER — PROPOFOL 10 MG/ML IV BOLUS
INTRAVENOUS | Status: DC | PRN
  Administered 2016-12-28: 50 mg via INTRAVENOUS
  Administered 2016-12-28: 250 mg via INTRAVENOUS

## 2016-12-28 MED ORDER — ONDANSETRON HCL 4 MG/2ML IJ SOLN
INTRAMUSCULAR | Status: DC | PRN
  Administered 2016-12-28: 4 mg via INTRAVENOUS

## 2016-12-28 MED ORDER — 0.9 % SODIUM CHLORIDE (POUR BTL) OPTIME
TOPICAL | Status: DC | PRN
Start: 2016-12-28 — End: 2016-12-28
  Administered 2016-12-28 (×2): 1000 mL

## 2016-12-28 MED ORDER — FENTANYL CITRATE (PF) 250 MCG/5ML IJ SOLN
INTRAMUSCULAR | Status: AC
  Filled 2016-12-28: qty 5

## 2016-12-28 MED ORDER — PHENYLEPHRINE 40 MCG/ML (10ML) SYRINGE FOR IV PUSH (FOR BLOOD PRESSURE SUPPORT)
PREFILLED_SYRINGE | INTRAVENOUS | Status: AC
  Filled 2016-12-28: qty 10

## 2016-12-28 MED ORDER — EPHEDRINE 5 MG/ML INJ
INTRAVENOUS | Status: AC
  Filled 2016-12-28: qty 10

## 2016-12-28 SURGICAL SUPPLY — 52 items
BLADE SURG 10 STRL SS (BLADE) ×2 IMPLANT
BRUSH CYTOL CELLEBRITY 1.5X140 (MISCELLANEOUS) IMPLANT
CANISTER SUCT 3000ML PPV (MISCELLANEOUS) ×2 IMPLANT
CLIP TI MEDIUM 6 (CLIP) IMPLANT
CONT SPEC 4OZ CLIKSEAL STRL BL (MISCELLANEOUS) ×8 IMPLANT
COVER BACK TABLE 60X90IN (DRAPES) ×2 IMPLANT
COVER DOME SNAP 22 D (MISCELLANEOUS) ×2 IMPLANT
COVER SURGICAL LIGHT HANDLE (MISCELLANEOUS) ×2 IMPLANT
DERMABOND ADVANCED (GAUZE/BANDAGES/DRESSINGS) ×1
DERMABOND ADVANCED .7 DNX12 (GAUZE/BANDAGES/DRESSINGS) ×1 IMPLANT
DRAPE LAPAROTOMY T 102X78X121 (DRAPES) ×2 IMPLANT
DRSG AQUACEL AG ADV 3.5X14 (GAUZE/BANDAGES/DRESSINGS) IMPLANT
ELECT CAUTERY BLADE 6.4 (BLADE) ×2 IMPLANT
ELECT REM PT RETURN 9FT ADLT (ELECTROSURGICAL) ×2
ELECTRODE REM PT RTRN 9FT ADLT (ELECTROSURGICAL) ×1 IMPLANT
FORCEPS BIOP RJ4 1.8 (CUTTING FORCEPS) IMPLANT
FORCEPS BIOP SPYBITE 1.2X286 (FORCEP) ×2 IMPLANT
GAUZE SPONGE 4X4 12PLY STRL (GAUZE/BANDAGES/DRESSINGS) ×4 IMPLANT
GAUZE SPONGE 4X4 16PLY XRAY LF (GAUZE/BANDAGES/DRESSINGS) ×2 IMPLANT
GLOVE BIO SURGEON STRL SZ 6.5 (GLOVE) ×4 IMPLANT
GOWN STRL REUS W/ TWL LRG LVL3 (GOWN DISPOSABLE) ×2 IMPLANT
GOWN STRL REUS W/TWL LRG LVL3 (GOWN DISPOSABLE) ×2
HEMOSTAT SURGICEL 2X14 (HEMOSTASIS) IMPLANT
KIT BASIN OR (CUSTOM PROCEDURE TRAY) ×2 IMPLANT
KIT CLEAN ENDO COMPLIANCE (KITS) ×6 IMPLANT
KIT ROOM TURNOVER OR (KITS) ×4 IMPLANT
MARKER SKIN DUAL TIP RULER LAB (MISCELLANEOUS) ×2 IMPLANT
NEEDLE BIOPSY TRANSBRONCH 21G (NEEDLE) IMPLANT
NEEDLE BLUNT 18X1 FOR OR ONLY (NEEDLE) IMPLANT
NEEDLE EBUS SONO TIP PENTAX (NEEDLE) ×2 IMPLANT
NS IRRIG 1000ML POUR BTL (IV SOLUTION) ×4 IMPLANT
OIL SILICONE PENTAX (PARTS (SERVICE/REPAIRS)) ×2 IMPLANT
PACK SURGICAL SETUP 50X90 (CUSTOM PROCEDURE TRAY) ×2 IMPLANT
PAD ARMBOARD 7.5X6 YLW CONV (MISCELLANEOUS) ×8 IMPLANT
PENCIL BUTTON HOLSTER BLD 10FT (ELECTRODE) ×2 IMPLANT
SPONGE INTESTINAL PEANUT (DISPOSABLE) IMPLANT
STAPLER VISISTAT 35W (STAPLE) IMPLANT
SUT VIC AB 3-0 SH 18 (SUTURE) ×2 IMPLANT
SUT VICRYL 4-0 PS2 18IN ABS (SUTURE) ×2 IMPLANT
SWAB COLLECTION DEVICE MRSA (MISCELLANEOUS) IMPLANT
SYR 10ML LL (SYRINGE) ×2 IMPLANT
SYR 20CC LL (SYRINGE) ×4 IMPLANT
SYR 20ML ECCENTRIC (SYRINGE) ×2 IMPLANT
TOWEL GREEN STERILE (TOWEL DISPOSABLE) ×4 IMPLANT
TOWEL GREEN STERILE FF (TOWEL DISPOSABLE) ×4 IMPLANT
TOWEL OR 17X24 6PK STRL BLUE (TOWEL DISPOSABLE) ×4 IMPLANT
TOWEL OR 17X26 10 PK STRL BLUE (TOWEL DISPOSABLE) ×2 IMPLANT
TRAP SPECIMEN MUCOUS 40CC (MISCELLANEOUS) ×2 IMPLANT
TUBE ANAEROBIC SPECIMEN COL (MISCELLANEOUS) IMPLANT
TUBE CONNECTING 12X1/4 (SUCTIONS) ×4 IMPLANT
TUBE CONNECTING 20X1/4 (TUBING) ×2 IMPLANT
WATER STERILE IRR 1000ML POUR (IV SOLUTION) ×4 IMPLANT

## 2016-12-28 NOTE — H&P (Signed)
[address]       [place] [address]             [phone]                    John Hubbard  Medical Record #[id number] Date of Birth: 20-Jul-1961  Referring:Dr Benay Spice Primary Care: Briscoe Deutscher, DO  Chief Complaint:    Mediastinal adenopathy   History of Present Illness:    John Hubbard 56 y.o. male is seen in the office  today for mediastinal adenopathy  with history of stage T3N1 colon cancer 08/2013. Recent ct of achest and pet scan done. Attemp at transesophageal biopsy of abdominal nodes was not diagnostic. Patient denies chest symptoms.    Current Activity/ Functional Status:  Patient is independent with mobility/ambulation, transfers, ADL's, IADL's.   Zubrod Score: At the time of surgery this patient's most appropriate activity status/level should be described as: [x]     0    Normal activity, no symptoms []     1    Restricted in physical strenuous activity but ambulatory, able to do out light work []     2    Ambulatory and capable of self care, unable to do work activities, up and about               >50 % of waking hours                              []     3    Only limited self care, in bed greater than 50% of waking hours []     4    Completely disabled, no self care, confined to bed or chair []     5    Moribund   Past Medical History:  Diagnosis Date  . Anxiety   . Cancer ([place])    hx colon cancer, surgery, chemotherapy -last 5'15  . Complication of anesthesia    WOKE UP DURING 1 COLONSCOPY; diffficult wake up  . Gout   . History of bipolar disorder    NO CURRENY BIPOLAR MEDS  . History of blood transfusion 2015  . History of colon cancer   . History of gout    10-17-14 no recent issues  . Numbness and tingling of both feet     Past Surgical History:  Procedure Laterality Date  . COLONOSCOPY Bilateral 07/24/2013   Procedure: COLONOSCOPY;  Surgeon: [name], MD;  [facility];  Service: Endoscopy;   Laterality: Bilateral;  . COLONOSCOPY WITH PROPOFOL N/A 10/23/2014   Procedure: COLONOSCOPY WITH PROPOFOL;  Surgeon: [name], MD;  [facility];  Service: Endoscopy;  Laterality: N/A;  . EUS N/A 12/15/2016   Procedure: UPPER ENDOSCOPIC ULTRASOUND (EUS) LINEAR;  Surgeon: [name], MD;  [facility];  Service: Endoscopy;  Laterality: N/A;  . HERNIA REPAIR  2/56/38   Umbilical Hernia Repair  . LAPAROSCOPIC PARTIAL COLECTOMY N/A 08/14/2013   Procedure: LAPAROSCOPIC PARTIAL COLECTOMY wound vac placement;  Surgeon: [name], MD;  [facility];  Service: General;  Laterality: N/A;  . PORT-A-CATH REMOVAL N/A 05/14/2014   Procedure: REMOVAL PORT-A-CATH;  Surgeon: [name], MD;  [facility];  Service: General;  Laterality: N/A;  . PORTACATH PLACEMENT Left 09/16/2013   Procedure: INSERTION PORT-A-CATH;  Surgeon: [name], MD;  Location: [facility];  Service: General;  Laterality: Left;  dressing change on lower abdominal wound    Family History  Problem Relation Age of Onset  . Heart disease Father     Social History   Social History  . Marital status: Married    Spouse name: N/A  . Number of children: N/A  . Years of education: N/A   Occupational History  . Not on file.   Social History Main Topics  . Smoking status: Never Smoker  . Smokeless tobacco: Never Used  . Alcohol use Yes     Comment: rare  . Drug use: No  . Sexual activity: Not on file   Other Topics Concern  . Not on file   Social History Narrative  . No narrative on file    History  Smoking Status  . Never Smoker  Smokeless Tobacco  . Never Used    History  Alcohol Use  . Yes    Comment: rare     Allergies  Allergen Reactions  . Penicillins Hives and Other (See Comments)    Has patient had a PCN reaction causing immediate rash, facial/tongue/throat swelling, SOB or lightheadedness with hypotension: No Has patient had a PCN reaction  causing SEVERE RASH INVOLVING MUCUS MEMBRANES or SKIN NECROSIS: #  #  #  YES  #  #  #  Has patient had a PCN reaction that required hospitalization #  #  #  YES  #  #  #  Has patient had a PCN reaction occurring within the last 10 years: No     Current Facility-Administered Medications  Medication Dose Route Frequency Provider Last Rate Last Dose  . chlorhexidine (HIBICLENS) 4 % liquid 4 application  60 mL Topical Once [name], MD       And  . Derrill Memo ON 12/29/2016] chlorhexidine (HIBICLENS) 4 % liquid 4 application  60 mL Topical Once [name], MD      . lactated ringers infusion   Intravenous Continuous Belinda Block, MD 10 mL/hr at 12/28/16 1056    . lactated ringers infusion   Intravenous Continuous Belinda Block, MD 10 mL/hr at 12/28/16 1056    . vancomycin (VANCOCIN) 1,500 mg in sodium chloride 0.9 % 500 mL IVPB  1,500 mg Intravenous To SS-Surg [name], MD          Review of Systems:     Cardiac Review of Systems: Y or N  Chest Pain [ n   ]  Resting SOB [n   ] Exertional SOB  [n  ]  Orthopnea [ n ]   Pedal Edema [ n  ]    Palpitations [n ] Syncope  [n ]   Presyncope [ n  ]  General Review of Systems: [Y] = yes [  ]=no Constitional: recent weight change [  ];  Wt loss over the last 3 months [   ] anorexia [  ]; fatigue [  ]; nausea [  ]; night sweats [  ]; fever [  ]; or chills [  ];          Dental: poor dentition[  ]; Last Dentist visit:   Eye : blurred vision [  ]; diplopia [   ]; vision changes [  ];  Amaurosis fugax[  ]; Resp: cough [n  ];  wheezing[ n];  hemoptysis[n]; shortness of breath[ n ]; paroxysmal nocturnal dyspnea[  ]; dyspnea on exertion[  ]; or orthopnea[  ];  GI:  gallstones[  ], vomiting[  ];  dysphagia[  ]; melena[  ];  hematochezia [nsthesias[  ];  difficulty walking[n ];  Psych:depression[  ]; anxiety[  ];  Endocrine: diabetes[n ];  thyroid dysfunction[  ];  Immunizations: Flu up to date [  ]; Pneumococcal up to date [   ];  Other:  Physical Exam: BP (!) 172/103   Pulse 84   Temp 98.4 F (36.9 C) (Oral)   Resp (!) 24   SpO2 96%   PHYSICAL EXAMINATION: General appearance: alert, cooperative and no distress Head: Normocephalic, without obvious abnormality, atraumatic Neck: no adenopathy, no carotid bruit, no JVD, supple, symmetrical, trachea midline and thyroid not enlarged, symmetric, no tenderness/mass/nodules Lymph nodes: Cervical, supraclavicular, and axillary nodes normal. Resp: clear to auscultation bilaterally Back: symmetric, no curvature. ROM normal. No CVA tenderness. Cardio: regular rate and rhythm, S1, S2 normal, no murmur, click, rub or gallop GI: soft, non-tender; bowel sounds normal; no masses,  no organomegaly Extremities: extremities normal, atraumatic, no cyanosis or edema and Homans sign is negative, no sign of DVT Neurologic: Grossly normal  Diagnostic Studies & Laboratory data:     Recent Radiology Findings:   Nm Pet Image Initial (pi) Skull Base To Thigh  Result Date: 12/05/2016 CLINICAL DATA:  New adenopathy in the chest and new pulmonary nodule with history of colon cancer diagnosed in 2014. This is the patient's first PET-CT. EXAM: NUCLEAR MEDICINE PET SKULL BASE TO THIGH TECHNIQUE: 17.2 mCi F-18 FDG was injected intravenously. Full-ring PET imaging was performed from the skull base to thigh after the radiotracer. CT data was obtained and used for attenuation correction and anatomic localization. FASTING BLOOD GLUCOSE:  Value: 130 mg/dl COMPARISON:  CT scan November 15, 2016 FINDINGS: NECK There is some muscular uptake around the upper cervical spine. No convincing evidence of FDG avid disease seen in the neck. Uptake in the oropharynx is symmetric and likely physiologic. CHEST FDG avid nodes are seen in the mediastinum and hila. The representative right paratracheal node on CT image 69 demonstrates a maximum SUV of 7. The nodule in the medial right upper lobe is seen on series 4,  image 75. By report, this nodule has grown since 2015 which is suspicious. However, there is no abnormal FDG uptake in this region of the nodule, likely simply too small to characterize with PET imaging. ABDOMEN/PELVIS There is an epicardial nodes seen on series 4, image 94, inferior to the liver dome which is FDG avid. The maximum SUV is 7.2. Porta hepatis and portacaval nodes are FDG avid as well. There is also an FDG avid node anterior to the right side of the crus on series 4, image 117. The representative portacaval node seen on CT image 123 demonstrates a maximum SUV of 19.8. No other FDG-avid malignancy seen in the abdomen or pelvis. A focus of uptake in the sigmoid colon is likely physiologic. SKELETON No focal hypermetabolic activity to suggest skeletal metastasis. IMPRESSION: 1. FDG avid metastatic disease in the mediastinum and hila. FDG avid metastatic nodes in the porta hepatis and portacaval region. There is also a metastatic node just anterior to the right crus in the upper abdomen and another FDG avid metastatic epicardial lymph node. No other convincing evidence of metastatic disease elsewhere. 2. The growing nodule in the medial right upper lobe is suspicious but too small to characterize with PET imaging. Electronically Signed   By: Dorise Bullion III M.D   On: 12/05/2016 16:32    I have independently reviewed the above radiology studies  and  reviewed the findings with the patient.   Recent Lab Findings: Lab Results  Component Value Date   WBC 6.5 10/28/2016   HGB 15.9 10/28/2016   HCT 47.5 10/28/2016   PLT 186 10/28/2016   GLUCOSE 134 10/28/2016   ALT 31 10/28/2016   AST 20 10/28/2016   NA 140 10/28/2016   K 4.4 10/28/2016   CL 96 08/17/2013   CREATININE 0.9 10/28/2016   BUN 16.1 10/28/2016   CO2 28 10/28/2016      Assessment / Plan:   FDG avid mediastinal adenopathy and bilaterial hila- concern for metastatic  disease but pattern may be lymphoma rather then metastatic  colon cancer I have recommended proceeding with Bronchoscopy/EBUS possible Mediastinoscopyy to confirm tissue diagnosis .  The goals risks and alternatives of the planned surgical procedure Procedure(s): VIDEO BRONCHOSCOPY WITH ENDOBRONCHIAL ULTRASOUND, possible Mediastinoscopy (N/A) MEDIASTINOSCOPY (N/A)  have been discussed with the patient in detail. The risks of the procedure including death, infection, stroke, myocardial infarction, bleeding, blood transfusion have all been discussed specifically.  I have quoted [name] a 1% of perioperative mortality and a complication rate as high as 10 %. The patient's questions have been answered.[name] is willing  to proceed with the planned procedure.    [name] MD      [address] [place] [address] Office [phone]   Beeper [phone]  12/28/2016 11:03 AM

## 2016-12-28 NOTE — Discharge Instructions (Signed)
Flexible Bronchoscopy, Care After °This sheet gives you information about how to care for yourself after your procedure. Your health care provider may also give you more specific instructions. If you have problems or questions, contact your health care provider. °What can I expect after the procedure? °After the procedure, it is common to have the following symptoms for 24-48 hours: °· A cough that is worse than it was before the procedure. °· A low-grade fever. °· A sore throat or hoarse voice. °· Small streaks of blood in the mucus from your lungs (sputum), if tissue samples were removed (biopsy). ° °Follow these instructions at home: °Eating and drinking °· Do not eat or drink anything (including water) for 2 hours after your procedure, or until your numbing medicine (local anesthetic) has worn off. Having a numb throat increases your risk of burning yourself or choking. °· After your numbness is gone and your cough and gag reflexes have returned, you may start eating only soft foods and slowly drinking liquids. °· The day after the procedure, return to your normal diet. °Driving °· Do not drive for 24 hours if you were given a medicine to help you relax (sedative). °· Do not drive or use heavy machinery while taking prescription pain medicine. °General instructions °· Take over-the-counter and prescription medicines only as told by your health care provider. °· Return to your normal activities as told by your health care provider. Ask your health care provider what activities are safe for you. °· Do not use any products that contain nicotine or tobacco, such as cigarettes and e-cigarettes. If you need help quitting, ask your health care provider. °· Keep all follow-up visits as told by your health care provider. This is important, especially if you had a biopsy taken. °Get help right away if: °· You have shortness of breath that gets worse. °· You become light-headed or feel like you might faint. °· You have  chest pain. °· You cough up more than a small amount of blood. °· The amount of blood you cough up increases. °Summary °· Common symptoms in the 24-48 hours following a flexible bronchoscopy include cough, low-grade fever, sore throat or hoarse voice, and blood-streaked mucus from the lungs (if you had a biopsy). °· Do not eat or drink anything (including water) for 2 hours after your procedure, or until your local anesthetic has worn off. You can return to your normal diet the day after the procedure. °· Get help right away if you develop worsening shortness of breath, have chest pain, become light-headed, or cough up more than a small amount of blood. °This information is not intended to replace advice given to you by your health care provider. Make sure you discuss any questions you have with your health care provider. °Document Released: 04/08/2005 Document Revised: 10/07/2016 Document Reviewed: 10/07/2016 °Elsevier Interactive Patient Education © 2017 [employer] ° °

## 2016-12-28 NOTE — Anesthesia Procedure Notes (Signed)
Procedure Name: Intubation Date/Time: 12/28/2016 11:25 AM Performed by: Trixie Deis A Pre-anesthesia Checklist: Patient identified, Emergency Drugs available, Suction available and Patient being monitored Patient Re-evaluated:Patient Re-evaluated prior to inductionOxygen Delivery Method: Circle System Utilized Preoxygenation: Pre-oxygenation with 100% oxygen Intubation Type: IV induction Ventilation: Mask ventilation without difficulty and Oral airway inserted - appropriate to patient size Laryngoscope Size: Glidescope and 4 Grade View: Grade I Tube type: Oral Number of attempts: 1 Airway Equipment and Method: Stylet,  Oral airway and Bite block Placement Confirmation: ETT inserted through vocal cords under direct vision,  positive ETCO2 and breath sounds checked- equal and bilateral Secured at: 23 cm Tube secured with: Tape Dental Injury: Teeth and Oropharynx as per pre-operative assessment  Comments: DL with glidescope due to morbid obesity, TMD <3 fb, small mouth opening. Large amount of redundant tissue in airway but able to visualize cords well. OETT passed without issue. VSS. Bite block in place.

## 2016-12-28 NOTE — Transfer of Care (Signed)
Immediate Anesthesia Transfer of Care Note  Patient: John Hubbard  Procedure(s) Performed: Procedure(s): VIDEO BRONCHOSCOPY WITH ENDOBRONCHIAL ULTRASOUND (N/A) MEDIASTINOSCOPY (N/A)  Patient Location: PACU  Anesthesia Type:General  Level of Consciousness: awake, alert  and oriented  Airway & Oxygen Therapy: Patient Spontanous Breathing, Patient connected to face mask oxygen and bilat nasal trumpets. RT at bedside fitting for CPAP  Post-op Assessment: Report given to RN, Post -op Vital signs reviewed and stable and Patient moving all extremities  Post vital signs: Reviewed and stable  Last Vitals:  Vitals:   12/28/16 1053  BP: (!) 172/103  Pulse: 84  Resp: (!) 24  Temp: 36.9 C    Last Pain:  Vitals:   12/28/16 1053  TempSrc: Oral      Patients Stated Pain Goal: 6 (25/00/37 0488)  Complications: No apparent anesthesia complications

## 2016-12-28 NOTE — Anesthesia Preprocedure Evaluation (Addendum)
Anesthesia Evaluation  Patient identified by MRN, date of birth, ID band Patient awake    Reviewed: Allergy & Precautions, NPO status , Patient's Chart, lab work & pertinent test results  Airway Mallampati: II  TM Distance: <3 FB Neck ROM: Full    Dental  (+) Teeth Intact, Dental Advisory Given   Pulmonary neg pulmonary ROS,    breath sounds clear to auscultation       Cardiovascular negative cardio ROS   Rhythm:Regular Rate:Normal     Neuro/Psych    GI/Hepatic negative GI ROS, Neg liver ROS,   Endo/Other  negative endocrine ROS  Renal/GU negative Renal ROS     Musculoskeletal   Abdominal   Peds  Hematology   Anesthesia Other Findings   Reproductive/Obstetrics                            Anesthesia Physical Anesthesia Plan  ASA: III  Anesthesia Plan: General   Post-op Pain Management:    Induction: Intravenous  Airway Management Planned: Oral ETT and Video Laryngoscope Planned  Additional Equipment:   Intra-op Plan:   Post-operative Plan: Possible Post-op intubation/ventilation  Informed Consent: I have reviewed the patients History and Physical, chart, labs and discussed the procedure including the risks, benefits and alternatives for the proposed anesthesia with the patient or authorized representative who has indicated his/her understanding and acceptance.   Dental advisory given  Plan Discussed with: CRNA, Anesthesiologist and Surgeon  Anesthesia Plan Comments:        Anesthesia Quick Evaluation

## 2016-12-28 NOTE — Brief Op Note (Signed)
12/28/2016  2:34 PM  PATIENT:  John Hubbard  56 y.o. male  PRE-OPERATIVE DIAGNOSIS:  Lung Mass, Adenopathy  POST-OPERATIVE DIAGNOSIS:  Lung Mass, Adenopathy- frozen section "burned out granuloma"  PROCEDURE:  Procedure(s): VIDEO BRONCHOSCOPY WITH ENDOBRONCHIAL ULTRASOUND (N/A) MEDIASTINOSCOPY (N/A) and biopsy  SURGEON:  Surgeon(s) and Role:    * [name], MD - Primary   ANESTHESIA:   general  EBL:  No intake/output data recorded.  BLOOD ADMINISTERED:none  DRAINS: none   LOCAL MEDICATIONS USED:  NONE  SPECIMEN:  Source of Specimen:  # 7 and 4R lymphnodes   DISPOSITION OF SPECIMEN:  PATHOLOGY  COUNTS:  YES   DICTATION: .Dragon Dictation  PLAN OF CARE: Discharge to home after PACU  PATIENT DISPOSITION:  PACU - hemodynamically stable.   Delay start of Pharmacological VTE agent (>24hrs) due to surgical blood loss or risk of bleeding: yes

## 2016-12-28 NOTE — Anesthesia Postprocedure Evaluation (Signed)
Anesthesia Post Note  Patient: John Hubbard  Procedure(s) Performed: Procedure(s) (LRB): VIDEO BRONCHOSCOPY WITH ENDOBRONCHIAL ULTRASOUND (N/A) MEDIASTINOSCOPY (N/A)  Patient location during evaluation: PACU Anesthesia Type: General Level of consciousness: awake Pain management: pain level controlled Vital Signs Assessment: post-procedure vital signs reviewed and stable Respiratory status: spontaneous breathing Cardiovascular status: stable Anesthetic complications: no       Last Vitals:  Vitals:   12/28/16 1759 12/28/16 1800  BP:    Pulse: 95   Resp: 10   Temp:  36.5 C    Last Pain:  Vitals:   12/28/16 1745  TempSrc:   PainSc: 0-No pain                 Lovelle Deitrick

## 2016-12-29 ENCOUNTER — Encounter (HOSPITAL_COMMUNITY): Payer: Self-pay | Admitting: Cardiothoracic Surgery

## 2016-12-29 LAB — ACID FAST SMEAR (AFB, MYCOBACTERIA): Acid Fast Smear: NEGATIVE

## 2016-12-29 NOTE — Op Note (Signed)
NAME:  ROOK, MAUE NO.:  MEDICAL RECORD NO.:  [id number]  LOCATION:                                 FACILITY:  PHYSICIAN:  Lanelle Bal, MD    DATE OF BIRTH:  08-Jun-1961  DATE OF PROCEDURE:  12/28/2016 DATE OF DISCHARGE:                              OPERATIVE REPORT   PREOPERATIVE DIAGNOSIS:  Mediastinal adenopathy.  POSTOPERATIVE DIAGNOSIS:  Mediastinal adenopathy.  SURGICAL PROCEDURE:  Bronchoscopy with EBUS needle.  Biopsy of #7 and 4R lymph nodes.  Mediastinoscopy with mediastinal lymph node biopsy.  SURGEON:  Lanelle Bal, MD  BRIEF HISTORY:  The patient is a 56 year old male, who 3 years previously had undergone treatment for colon cancer on recent CT of the chest.  The patient had evidence of mediastinal and abdominal adenopathy.  A PET scan was performed, showing hypermetabolic areas in the mediastinum, hilum and porta hepatis.  Attempts of biopsy with transesophageal biopsy were unsuccessful and the patient was referred for biopsy of mediastinal lymph nodes.  Risks and options were discussed with the patient in detail.  He agreed and signed informed consent.  DESCRIPTION OF PROCEDURE:  The patient underwent general endotracheal anesthesia.  Single-lumen endotracheal tube was placed.  Appropriate time-out was performed and then we proceeded with bronchoscopy with a 2.8-mm fiberoptic bronchoscope.  The endobronchial tree to the subsegmental level in the both left and right tracheobronchial tree without significant endobronchial lesions.  EBUS scope was then placed and #7 nodes and 4R nodes were identified with the scope and needle aspirations were performed in both of these areas.  Initial smears showed scant material and no definitive diagnosis.  We then decided as we had discussed with the patient preoperatively proceeding with mediastinoscopy.  The patient was positioned appropriately and the neck was prepped with Betadine and  draped in usual sterile manner by incision after second time-out was performed.  Small transverse incision was made in the suprasternal notch and dissection carried down to the prevertebral fascia, which was then bluntly dissected.  Video mediastinoscope was then introduced into the mediastinum down to the main carina.  The fatty tissue in this area was gently dissected away and mass of obviously abnormal firm nodes were observed along the 4R lymph nodes.  The area was aspirated.  We then used a small biopsy forceps and obtained tissue as the capsule of this area was removed. The larger biopsy forceps were then used.  Initial biopsies were submitted to Pathology and information returned from the pathologist status "burned out granuloma."  Additional specimens were taken and sent for permanent section with instructions after the frozen section findings to also culture of the material.  With the operative field hemostatic, the cervical fascia was then closed with interrupted 0 Vicryl, running 2-0 Vicryl in the subcutaneous tissue and a 3-0 subcuticular stitch.  Dermabond was applied.  The patient was awakened and extubated in the operating room and transferred to the recovery room for postoperative care.  Blood loss was minimal.  Sponge and needle counts were reported as correct at the completion of procedure.     Lanelle Bal, MD  EG/MEDQ  D:  12/28/2016  T:  12/28/2016  Job:  [id number]

## 2016-12-30 ENCOUNTER — Telehealth: Payer: Self-pay | Admitting: *Deleted

## 2016-12-30 NOTE — Telephone Encounter (Signed)
I spoke with Dr. Servando Snare. He states I can update patient biopsy showed no malignancy at this time.  I was unable to reach patient.

## 2016-12-30 NOTE — Telephone Encounter (Signed)
Oncology Nurse Navigator Documentation  Oncology Nurse Navigator Flowsheets 12/30/2016  Navigator Location [facility]  Navigator Encounter Type Telephone/Dr. Benay Spice reviewed pathology.  He asked that I call patient with an update. I was unable to reach patient.   Telephone Outgoing Call  Treatment Phase Abnormal Scans  Barriers/Navigation Needs Education  Education Other  Interventions Education  Acuity Level 1  Time Spent with Patient 15

## 2017-01-02 ENCOUNTER — Telehealth: Payer: Self-pay | Admitting: *Deleted

## 2017-01-02 LAB — AEROBIC/ANAEROBIC CULTURE W GRAM STAIN (SURGICAL/DEEP WOUND): Culture: NO GROWTH

## 2017-01-02 NOTE — Telephone Encounter (Signed)
Oncology Nurse Navigator Documentation  Oncology Nurse Navigator Flowsheets 01/02/2017  Navigator Location [facility]  Navigator Encounter Type Telephone  Telephone Outgoing Call/per Dr. Servando Snare and Dr. Benay Spice they would like me to update John Hubbard on his pathology.  I called today and spoke with John Hubbard.  I updated him on pathology, cytology, and that cultures will take up to 6 weeks to get completed results back.  John Hubbard was thankful for the updated information.   Treatment Phase Abnormal Scans  Barriers/Navigation Needs Education  Education Other  Interventions Education  Acuity Level 1  Acuity Level 1 Minimal follow up required  Time Spent with Patient 15

## 2017-01-10 ENCOUNTER — Encounter: Payer: Federal, State, Local not specified - PPO | Admitting: Cardiothoracic Surgery

## 2017-01-13 ENCOUNTER — Encounter: Payer: Federal, State, Local not specified - PPO | Admitting: Cardiothoracic Surgery

## 2017-01-16 ENCOUNTER — Ambulatory Visit (HOSPITAL_BASED_OUTPATIENT_CLINIC_OR_DEPARTMENT_OTHER): Payer: Federal, State, Local not specified - PPO | Admitting: Oncology

## 2017-01-16 ENCOUNTER — Telehealth: Payer: Self-pay | Admitting: Oncology

## 2017-01-16 VITALS — BP 138/91 | HR 81 | Temp 98.2°F | Resp 20 | Wt 319.3 lb

## 2017-01-16 DIAGNOSIS — Z85038 Personal history of other malignant neoplasm of large intestine: Secondary | ICD-10-CM | POA: Diagnosis not present

## 2017-01-16 DIAGNOSIS — I10 Essential (primary) hypertension: Secondary | ICD-10-CM

## 2017-01-16 DIAGNOSIS — C189 Malignant neoplasm of colon, unspecified: Secondary | ICD-10-CM

## 2017-01-16 NOTE — Telephone Encounter (Signed)
Gave patient AVS and calender per 4/16 los. Per [facility] okay to cancel 7/20

## 2017-01-16 NOTE — Progress Notes (Signed)
[facility] OFFICE PROGRESS NOTE   Diagnosis: Colon cancer  INTERVAL HISTORY:   John Hubbard returns as scheduled. He underwent a bronchoscopy and mediastinoscopy by Dr. Servando Snare on 12/28/2016. Needle biopsies were obtained from level 7 and level 4R nodes. Level 4R nodes were aspirated and biopsied. Cultures for fungus returned negative, the AFB stain was negative, and the AFB culture is pending. The cytology from level VII and 4 nodes revealed no malignant cells. The surgical pathology [id number]) revealed an acellular specimen from the level VII node, and 2 biopsies from a level IV node revealed a "fibrotic lymph node "with no evidence of malignancy. The findings were felt to potentially represent old granulomas. Stains for microorganisms were negative.  He continues to have an intermittent cough. No other complaint. He is establishing with a primary physician and is scheduled for a sleep study.                  Objective:  Vital signs in last 24 hours:  There were no vitals taken for this visit.    HEENT: Healed surgical incision at the lower anterior neck Lymphatics: No cervical or supraclavicular nodes Resp: Few rhonchi at the left posterior base, no respiratory distress Cardio: Regular rate and rhythm GI: No hepatosplenomegaly, nontender Vascular: Chronic stasis change at the lower leg bilaterally with brown discoloration and scarred areas   Lab Results:  Lab Results  Component Value Date   WBC 6.7 12/28/2016   HGB 16.0 12/28/2016   HCT 47.7 12/28/2016   MCV 84.6 12/28/2016   PLT 189 12/28/2016   NEUTROABS 3.9 10/28/2016     Medications: I have reviewed the patient's current medications.  Assessment/Plan: 1. 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 The tumor returned microsatellite stable with no loss of mismatch repair protein expression.  Cycle 1 adjuvant CAPOX  beginning 09/18/2013.   Cycle 7 CAPOX 01/22/2014   Cycle 8 CAPOX 02/12/2014 (oxaliplatin deleted).  Surveillance colonoscopy January 2016  CT scans chest, abdomen and pelvis 08/15/2014 with no evidence of metastatic disease.  CT scans 11/15/2016-interval development of mediastinal adenopathy. Enlarging nodule within right upper lobe. No mass or adenopathy identified within the abdomen or pelvis.  PET scan 12/05/2016-FDG avid seen in the mediastinum and hila. Nodule in the medial right upper lobe increased since 2015, no abnormal FDG uptake in the region of the nodule. Epicardial node inferior to the liver dome FDG avid. Porta hepatis and portacaval nodes FDG avid as well. Also FDG avid node anterior to the right side of the crus.  EUS biopsy of a portacaval node on 12/15/2016-nondiagnostic  Bronchoscopy/mediastinoscopy 12/28/2016-biopsies of level 7 and leve l4R nodes negative for malignancy. Changes consistent with a fibrotic lymph node-potentially old granulomas identified on the level 7 node 2. Gout. 3. History of anxiety maintained on Tegretol. 4. Hypertension. 5. Delayed nausea. Improved with Aloxi/Emend 6. Rash-the rash occurred following cycle 7 CAPOX. It is possible the rash is related to capecitabine. Improved.  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      Disposition:  John Hubbard appears stable. I discussed the pathology findings with him. There was no evidence of metastatic colon cancer on biopsy of mediastinal lymph nodes. The enlarged hypermetabolic lymph nodes may represent an old infection or sarcoidosis. We will check a ACE level when he returns for an office visit and CEA  in 3 months.   I will present his case at the GI tumor conference to discuss the indication for surveillance of the hypermetabolic lymph nodes and small right upper lobe  nodule.    Betsy Coder, MD  01/16/2017  4:32 PM

## 2017-01-18 LAB — CULTURE, FUNGUS WITHOUT SMEAR

## 2017-01-23 ENCOUNTER — Ambulatory Visit (INDEPENDENT_AMBULATORY_CARE_PROVIDER_SITE_OTHER): Payer: Federal, State, Local not specified - PPO | Admitting: Cardiothoracic Surgery

## 2017-01-23 ENCOUNTER — Encounter: Payer: Self-pay | Admitting: Cardiothoracic Surgery

## 2017-01-23 VITALS — BP 154/100 | HR 88 | Resp 20 | Ht 70.0 in | Wt 315.0 lb

## 2017-01-23 DIAGNOSIS — Z9889 Other specified postprocedural states: Secondary | ICD-10-CM

## 2017-01-23 DIAGNOSIS — R918 Other nonspecific abnormal finding of lung field: Secondary | ICD-10-CM

## 2017-01-23 DIAGNOSIS — R591 Generalized enlarged lymph nodes: Secondary | ICD-10-CM | POA: Diagnosis not present

## 2017-01-23 DIAGNOSIS — R599 Enlarged lymph nodes, unspecified: Secondary | ICD-10-CM

## 2017-01-23 NOTE — Progress Notes (Signed)
[address]       [place] [address]             [phone]      John Hubbard [facility] Medical Record #[id number] Date of Birth: 1961-04-08  Referring: Ladell Pier, MD Primary Care: Briscoe Deutscher, DO  Chief Complaint:   POST OP FOLLOW UP 12/28/2016 OPERATIVE REPORT PREOPERATIVE DIAGNOSIS:  Mediastinal adenopathy. POSTOPERATIVE DIAGNOSIS:  Mediastinal adenopathy. SURGICAL PROCEDURE:  Bronchoscopy with EBUS needle.  Biopsy of #7 and 4R lymph nodes.  Mediastinoscopy with mediastinal lymph node biopsy. SURGEON:  Lanelle Bal, MD  Path: Diagnosis 1. Lymph node, biopsy, Level 7 - ESSENTIALLY ACELLULAR SPECIMEN. 2. Lymph node, biopsy, 4R - CONSISTENT WITH FIBROTIC LYMPH NODE. - THERE IS NO EVIDENCE OF MALIGNANCY. - SEE COMMENT. 3. Lymph node, biopsy, 4R #2 - CONSISTENT WITH FIBROTIC LYMPH NODE. - THERE IS NO EVIDENCE OF MALIGNANCY. - SEE COMMENT. Microscopic Comment 2. The findings may represent old/chronic granulomas. 3. The findings may represent old/chronic granulomas. AFB, GMS and PAS-F stains are negative for the presence of microorganisms. Please also see the patient's concurrent microbiology specimens.   History of Present Illness:          Past Medical History:  Diagnosis Date  . Anxiety   . Cancer ([place])    hx colon cancer, surgery, chemotherapy -last 5'15  . Complication of anesthesia    WOKE UP DURING 1 COLONSCOPY; diffficult wake up  . Gout   . History of bipolar disorder    NO CURRENY BIPOLAR MEDS  . History of blood transfusion 2015  . History of colon cancer   . History of gout    10-17-14 no recent issues  . Numbness and tingling of both feet      History  Smoking Status  . Never Smoker  Smokeless Tobacco  . Never Used    History  Alcohol Use  . Yes    Comment: rare     Allergies  Allergen Reactions  . Penicillins Hives and Other (See Comments)    Has patient had a PCN reaction causing  immediate rash, facial/tongue/throat swelling, SOB or lightheadedness with hypotension: No Has patient had a PCN reaction causing SEVERE RASH INVOLVING MUCUS MEMBRANES or SKIN NECROSIS: #  #  #  YES  #  #  #  Has patient had a PCN reaction that required hospitalization #  #  #  YES  #  #  #  Has patient had a PCN reaction occurring within the last 10 years: No     Current Outpatient Prescriptions  Medication Sig Dispense Refill  . allopurinol (ZYLOPRIM) 300 MG tablet Take 1 tablet (300 mg total) by mouth daily. (Patient taking differently: Take 300 mg by mouth every evening. ) 90 tablet 3  . carbamazepine (TEGRETOL) 200 MG tablet Take 400 mg by mouth at bedtime.     . colchicine 0.6 MG tablet Take 0.6 mg by mouth 2 (two) times daily as needed (Gout).     . traMADol (ULTRAM) 50 MG tablet Take 1 tablet (50 mg total) by mouth every 6 (six) hours as needed for severe pain. (Patient not taking: Reported on 01/23/2017) 20 tablet 0   No current facility-administered medications for this visit.        Physical Exam: BP (!) 154/100   Pulse 88   Resp 20   Ht 5\' 10"  (1.778 m)   Wt (!) 315 lb (142.9 kg)  SpO2 97%   BMI 45.20 kg/m   General appearance: alert, cooperative and morbidly obese Neurologic: intact Heart: regular rate and rhythm, S1, S2 normal, no murmur, click, rub or gallop Lungs: diminished breath sounds bibasilar Abdomen: soft, non-tender; bowel sounds normal; no masses,  no organomegaly Extremities: extremities normal, atraumatic, no cyanosis or edema and Homans sign is negative, no sign of DVT Wound: Neck incision is well-healed   Diagnostic Studies & Laboratory data:     Recent Radiology Findings:  Dg Chest Port 1 View  Result Date: 12/28/2016 CLINICAL DATA:  Adenopathy, post bronchoscopy. EXAM: PORTABLE CHEST 1 VIEW COMPARISON:  Chest radiograph December 28, 2016 at 0916 hours FINDINGS: Cardiac silhouette appears moderately enlarged, in part projectional due to AP  technique. Widened mediastinum. Pulmonary vascular congestion with bibasilar strandy densities. No pleural effusion. No pneumothorax. Large body habitus. Osseous structures are nonsuspicious. IMPRESSION: Cardiomegaly and widened mediastinum accentuated by AP technique. Pulmonary vascular congestion.  Bibasilar atelectasis. Electronically Signed   By: Elon Alas M.D.   On: 12/28/2016 16:26    Recent Lab Findings: Lab Results  Component Value Date   WBC 6.7 12/28/2016   HGB 16.0 12/28/2016   HCT 47.7 12/28/2016   PLT 189 12/28/2016   GLUCOSE 146 (H) 12/28/2016   ALT 27 12/28/2016   AST 21 12/28/2016   NA 138 12/28/2016   K 4.2 12/28/2016   CL 101 12/28/2016   CREATININE 0.84 12/28/2016   BUN 18 12/28/2016   CO2 29 12/28/2016   INR 0.94 12/28/2016   Micro: Results for orders placed or performed during the hospital encounter of 12/28/16  Surgical pcr screen     Status: None   Collection Time: 12/28/16 12:59 PM  Result Value Ref Range Status   MRSA, PCR NEGATIVE NEGATIVE Final   Staphylococcus aureus NEGATIVE NEGATIVE Final    Comment:        The Xpert SA Assay (FDA approved for NASAL specimens in patients over 68 years of age), is one component of a comprehensive surveillance program.  Test performance has been validated by [facility] for patients greater than or equal to 34 year old. It is not intended to diagnose infection nor to guide or monitor treatment.   Culture, fungus without smear     Status: None   Collection Time: 12/28/16  2:29 PM  Result Value Ref Range Status   Specimen Description LYMPH NODE  Final   Special Requests 4R NODE BIOPSY 2  Final   Culture NO FUNGUS ISOLATED AFTER 21 DAYS  Final   Report Status 01/18/2017 FINAL  Final  Aerobic/Anaerobic Culture (surgical/deep wound)     Status: None   Collection Time: 12/28/16  2:29 PM  Result Value Ref Range Status   Specimen Description LYMPH NODE  Final   Special Requests 4R BIOPSY 2  Final   Gram  Stain   Final    FEW WBC PRESENT,BOTH PMN AND MONONUCLEAR NO ORGANISMS SEEN    Culture No growth aerobically or anaerobically.  Final   Report Status 01/02/2017 FINAL  Final  Acid Fast Smear (AFB)     Status: None   Collection Time: 12/28/16  2:29 PM  Result Value Ref Range Status   AFB Specimen Processing Concentration  Final   Acid Fast Smear Negative  Final    Comment: (NOTE) Performed At: [facility] [place], [place] [id number] Lindon Romp MD ZM:[id number]    Source (AFB) LYMPH NODE  Final    Comment:  4R BIOPSY 2      Assessment / Plan:      Patient stable after recent bronchoscopy mediastinoscopy, so far microbiology cultures have been negative. The pathology in the nodes is most consistent with chronic granulomatous disease, no malignancy was identified.  July the patient is to have CEA and ACE testing.   I've not made the patient a follow-up appointment to see me as he's closely followed in the oncology clinic but would recommend a follow-up CT scan of the chest in 8-12 months from his scan in FEB 2018.   [name] MD      [address] [place] [address] Office [phone]   Beeper [phone]  01/23/2017 5:09 PM

## 2017-01-26 ENCOUNTER — Institutional Professional Consult (permissible substitution): Payer: Federal, State, Local not specified - PPO | Admitting: Neurology

## 2017-02-10 LAB — ACID FAST CULTURE WITH REFLEXED SENSITIVITIES (MYCOBACTERIA): Acid Fast Culture: NEGATIVE

## 2017-02-14 ENCOUNTER — Telehealth: Payer: Self-pay

## 2017-02-14 NOTE — Telephone Encounter (Signed)
LM for patient to call back and r/s appt on 5/24, Dr. Rexene Alberts will be out of the office at that time. Left call back number.

## 2017-02-23 ENCOUNTER — Institutional Professional Consult (permissible substitution): Payer: Federal, State, Local not specified - PPO | Admitting: Neurology

## 2017-03-04 NOTE — Addendum Note (Signed)
Addendum  created 03/04/17 1113 by Tykwon Fera, MD   Sign clinical note    

## 2017-04-21 ENCOUNTER — Ambulatory Visit: Payer: Federal, State, Local not specified - PPO | Admitting: Nurse Practitioner

## 2017-04-21 ENCOUNTER — Other Ambulatory Visit: Payer: Federal, State, Local not specified - PPO

## 2017-04-28 ENCOUNTER — Ambulatory Visit (HOSPITAL_BASED_OUTPATIENT_CLINIC_OR_DEPARTMENT_OTHER): Payer: Federal, State, Local not specified - PPO | Admitting: Nurse Practitioner

## 2017-04-28 ENCOUNTER — Telehealth: Payer: Self-pay | Admitting: Oncology

## 2017-04-28 ENCOUNTER — Other Ambulatory Visit (HOSPITAL_BASED_OUTPATIENT_CLINIC_OR_DEPARTMENT_OTHER): Payer: Federal, State, Local not specified - PPO

## 2017-04-28 VITALS — BP 136/85 | HR 85 | Temp 98.6°F | Resp 18 | Ht 70.0 in | Wt 312.3 lb

## 2017-04-28 DIAGNOSIS — C189 Malignant neoplasm of colon, unspecified: Secondary | ICD-10-CM | POA: Diagnosis not present

## 2017-04-28 DIAGNOSIS — C187 Malignant neoplasm of sigmoid colon: Secondary | ICD-10-CM

## 2017-04-28 DIAGNOSIS — Z85038 Personal history of other malignant neoplasm of large intestine: Secondary | ICD-10-CM

## 2017-04-28 NOTE — Telephone Encounter (Signed)
Gave patient avs report and appointments for October  °

## 2017-04-28 NOTE — Progress Notes (Signed)
  John Hubbard OFFICE PROGRESS NOTE   Diagnosis:  Colon cancer  INTERVAL HISTORY:   John Hubbard returns as scheduled. He feels well. No change in bowel habits. No abdominal pain. No nausea or vomiting. He denies fever, cough and shortness of breath. He has a good appetite.  Objective:  Vital signs in last 24 hours:  Blood pressure 136/85, pulse 85, temperature 98.6 F (37 C), temperature source Oral, resp. rate 18, height '5\' 10"'$  (1.778 m), weight (!) 312 lb 4.8 oz (141.7 kg), SpO2 97 %.    HEENT: Neck without mass. Lymphatics: No palpable cervical, supraclavicular, axillary or inguinal lymph nodes. Resp: Lungs clear bilaterally. Cardio: Regular rate and rhythm. GI: No hepatosplenomegaly. Vascular: Trace lower leg edema bilaterally.  Lab Results:  Lab Results  Component Value Date   WBC 6.7 12/28/2016   HGB 16.0 12/28/2016   HCT 47.7 12/28/2016   MCV 84.6 12/28/2016   PLT 189 12/28/2016   NEUTROABS 3.9 10/28/2016    Imaging:  No results found.  Medications: I have reviewed the patient's current medications.  Assessment/Plan: 1. 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 The tumor returned microsatellite stable with no loss of mismatch repair protein expression.  Cycle 1 adjuvant CAPOX beginning 09/18/2013.   Cycle 7 CAPOX 01/22/2014   Cycle 8 CAPOX 02/12/2014 (oxaliplatin deleted).  Surveillance colonoscopy January 2016  CT scans chest, abdomen and pelvis 08/15/2014 with no evidence of metastatic disease.  CT scans 11/15/2016-interval development of mediastinal adenopathy. Enlarging nodule within right upper lobe. No mass or adenopathy identified within the abdomen or pelvis.  PET scan 12/05/2016-FDG avid seen in the mediastinum and hila. Nodule in the medial right upper lobe increased since 2015, no abnormal FDG uptake in the region of the nodule. Epicardial node  inferior to the liver dome FDG avid. Porta hepatis and portacaval nodes FDG avid as well. Also FDG avid node anterior to the right side of the crus.  EUS biopsy of a portacaval node on 12/15/2016-nondiagnostic  Bronchoscopy/mediastinoscopy 12/28/2016-biopsies of level 7 and leve l4R nodes negative for malignancy. Changes consistent with a fibrotic lymph node-potentially old granulomas identified on the level 7 node 2. Gout. He continues allopurinol. 3. History of anxiety maintained on Tegretol. 4. Hypertension. 5. Delayed nausea. Improved with Aloxi/Emend 6. Rash-the rash occurred following cycle 7 CAPOX. It is possible the rash is related to capecitabine. Improved.  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    Disposition: John Hubbard remains in clinical remission from colon cancer. We will follow-up on the CEA from today.  He will return for a follow-up visit and CEA in 3 months. He will contact the office the interim with any problems.      Ned Card ANP/GNP-BC   04/28/2017  3:48 PM

## 2017-04-29 LAB — ANGIOTENSIN CONVERTING ENZYME: Angio Convert Enzyme: 52 U/L (ref 14–82)

## 2017-05-01 LAB — CEA (IN HOUSE-CHCC): CEA (CHCC-In House): 2.84 ng/mL (ref 0.00–5.00)

## 2017-05-03 ENCOUNTER — Telehealth: Payer: Self-pay | Admitting: *Deleted

## 2017-05-03 NOTE — Telephone Encounter (Signed)
Telephone call to patient to advise lab results as directed below. Left message for a return call. 

## 2017-05-03 NOTE — Telephone Encounter (Signed)
-----   Message from Owens Shark, NP sent at 05/03/2017  8:52 AM EDT ----- Please let him know the CEA and ACE levels were normal. With regard to follow-up CT scans Dr. Benay Spice recommends doing CTs at an 8 month interval from the March PET scan which will be November. Follow-up as scheduled.

## 2017-05-18 ENCOUNTER — Ambulatory Visit: Payer: Federal, State, Local not specified - PPO | Admitting: Family Medicine

## 2017-06-02 ENCOUNTER — Ambulatory Visit: Payer: Federal, State, Local not specified - PPO | Admitting: Family Medicine

## 2017-07-18 ENCOUNTER — Telehealth: Payer: Self-pay

## 2017-07-18 NOTE — Telephone Encounter (Signed)
Patient called to cancel appointments. Per 10/16 los

## 2017-07-19 ENCOUNTER — Telehealth: Payer: Self-pay

## 2017-07-19 NOTE — Telephone Encounter (Signed)
Called and left a voice message.Rescheduled patient for earlier day and later time. 10/17per call return

## 2017-07-19 NOTE — Telephone Encounter (Signed)
Changed patient appointment per patient request. Now need to change again based on the time. prefer a late afternoon. Per 10/17 return calls

## 2017-07-20 ENCOUNTER — Other Ambulatory Visit: Payer: Federal, State, Local not specified - PPO

## 2017-07-20 ENCOUNTER — Ambulatory Visit: Payer: Federal, State, Local not specified - PPO | Admitting: Oncology

## 2017-08-01 ENCOUNTER — Telehealth: Payer: Self-pay | Admitting: *Deleted

## 2017-08-01 NOTE — Telephone Encounter (Signed)
Left message on voicemail informing pt Dr. Benay Spice will not be in the office tomorrow afternoon. Requested he call back with days/ times he can come in so we can schedule him at a time Dr. Benay Spice will be here.

## 2017-08-02 ENCOUNTER — Other Ambulatory Visit: Payer: Federal, State, Local not specified - PPO

## 2017-08-02 ENCOUNTER — Ambulatory Visit: Payer: Federal, State, Local not specified - PPO | Admitting: Oncology

## 2017-09-04 ENCOUNTER — Other Ambulatory Visit: Payer: Federal, State, Local not specified - PPO

## 2017-09-04 ENCOUNTER — Ambulatory Visit: Payer: Federal, State, Local not specified - PPO | Admitting: Oncology

## 2017-09-07 ENCOUNTER — Ambulatory Visit: Payer: Federal, State, Local not specified - PPO | Admitting: Oncology

## 2017-09-07 ENCOUNTER — Other Ambulatory Visit: Payer: Federal, State, Local not specified - PPO

## 2017-11-20 ENCOUNTER — Other Ambulatory Visit: Payer: Self-pay | Admitting: Family Medicine

## 2017-11-20 DIAGNOSIS — M1A9XX0 Chronic gout, unspecified, without tophus (tophi): Secondary | ICD-10-CM

## 2017-11-20 NOTE — Telephone Encounter (Signed)
Last OV and Rx 11/18/2016 #90 3R. pls advise

## 2017-12-24 ENCOUNTER — Other Ambulatory Visit: Payer: Self-pay | Admitting: Family Medicine

## 2017-12-24 DIAGNOSIS — M1A9XX0 Chronic gout, unspecified, without tophus (tophi): Secondary | ICD-10-CM

## 2017-12-25 NOTE — Telephone Encounter (Signed)
Last Rx 11/21/17. Last OV 11/2016

## 2018-04-13 ENCOUNTER — Encounter: Payer: Self-pay | Admitting: Physician Assistant

## 2018-04-13 ENCOUNTER — Ambulatory Visit: Payer: Federal, State, Local not specified - PPO | Admitting: Family Medicine

## 2018-04-13 ENCOUNTER — Ambulatory Visit: Payer: Federal, State, Local not specified - PPO | Admitting: Physician Assistant

## 2018-04-13 VITALS — BP 134/86 | HR 79 | Temp 98.7°F | Ht 70.0 in | Wt 303.4 lb

## 2018-04-13 DIAGNOSIS — Z1159 Encounter for screening for other viral diseases: Secondary | ICD-10-CM | POA: Diagnosis not present

## 2018-04-13 DIAGNOSIS — F319 Bipolar disorder, unspecified: Secondary | ICD-10-CM | POA: Diagnosis not present

## 2018-04-13 DIAGNOSIS — M1A9XX0 Chronic gout, unspecified, without tophus (tophi): Secondary | ICD-10-CM | POA: Diagnosis not present

## 2018-04-13 DIAGNOSIS — S81802A Unspecified open wound, left lower leg, initial encounter: Secondary | ICD-10-CM | POA: Diagnosis not present

## 2018-04-13 DIAGNOSIS — I776 Arteritis, unspecified: Secondary | ICD-10-CM | POA: Diagnosis not present

## 2018-04-13 DIAGNOSIS — Z114 Encounter for screening for human immunodeficiency virus [HIV]: Secondary | ICD-10-CM | POA: Diagnosis not present

## 2018-04-13 DIAGNOSIS — G629 Polyneuropathy, unspecified: Secondary | ICD-10-CM

## 2018-04-13 DIAGNOSIS — F419 Anxiety disorder, unspecified: Secondary | ICD-10-CM | POA: Diagnosis not present

## 2018-04-13 DIAGNOSIS — R7309 Other abnormal glucose: Secondary | ICD-10-CM | POA: Diagnosis not present

## 2018-04-13 MED ORDER — CARBAMAZEPINE 200 MG PO TABS: 400.0000 mg | ORAL_TABLET | Freq: Every day | ORAL | 2 refills | Status: DC

## 2018-04-13 MED ORDER — ALLOPURINOL 300 MG PO TABS: 300.0000 mg | ORAL_TABLET | Freq: Every day | ORAL | 1 refills | Status: DC

## 2018-04-13 NOTE — Assessment & Plan Note (Signed)
Controlled. Tegretol 400 mg daily.

## 2018-04-13 NOTE — Assessment & Plan Note (Signed)
Controlled. Currently not on medication for this.

## 2018-04-13 NOTE — Assessment & Plan Note (Addendum)
Repeat serum uric acid today. Will refill allopurinol 300 mg daily.

## 2018-04-13 NOTE — Progress Notes (Deleted)
   John Hubbard is a 57 y.o. male is here for follow up.  History of Present Illness:   HPI: Health Maintenance Due  Topic Date Due  . Hepatitis C Screening  11-16-1960  . HIV Screening  02/08/1976   No flowsheet data found. PMHx, SurgHx, SocialHx, FamHx, Medications, and Allergies were reviewed in the Visit Navigator and updated as appropriate.   Patient Active Problem List   Diagnosis Date Noted  . Adenopathy   . History of colon cancer   . Colon cancer ([place]) 07/31/2013   Social History   Tobacco Use  . Smoking status: Never Smoker  . Smokeless tobacco: Never Used  Substance Use Topics  . Alcohol use: Yes    Comment: rare  . Drug use: No   Current Medications and Allergies:   Current Outpatient Medications:  .  allopurinol (ZYLOPRIM) 300 MG tablet, TAKE 1 TABLET BY MOUTH EVERY DAY, Disp: 30 tablet, Rfl: 0 .  carbamazepine (TEGRETOL) 200 MG tablet, Take 400 mg by mouth at bedtime. , Disp: , Rfl:  .  colchicine 0.6 MG tablet, Take 0.6 mg by mouth 2 (two) times daily as needed (Gout). , Disp: , Rfl:  .  traMADol (ULTRAM) 50 MG tablet, Take 1 tablet (50 mg total) by mouth every 6 (six) hours as needed for severe pain. (Patient not taking: Reported on 01/23/2017), Disp: 20 tablet, Rfl: 0  Allergies  Allergen Reactions  . Penicillins Hives and Other (See Comments)    Has patient had a PCN reaction causing immediate rash, facial/tongue/throat swelling, SOB or lightheadedness with hypotension: No Has patient had a PCN reaction causing SEVERE RASH INVOLVING MUCUS MEMBRANES or SKIN NECROSIS: #  #  #  YES  #  #  #  Has patient had a PCN reaction that required hospitalization #  #  #  YES  #  #  #  Has patient had a PCN reaction occurring within the last 10 years: No    Review of Systems   Pertinent items are noted in the HPI. Otherwise, ROS is negative.  Vitals:  There were no vitals filed for this visit.   There is no height or weight on file to calculate  BMI.  Physical Exam:   Physical Exam Results for orders placed or performed in visit on 04/28/17  Angiotensin converting enzyme  Result Value Ref Range   Angio Convert Enzyme 52 14 - 82 U/L  CEA (IN HOUSE-[facility])  Result Value Ref Range   CEA ([facility]-In House) 2.84 0.00 - 5.00 ng/mL    Assessment and Plan:   There are no diagnoses linked to this encounter.  . Reviewed expectations re: course of current medical issues. . Discussed self-management of symptoms. . Outlined signs and symptoms indicating need for more acute intervention. . Patient verbalized understanding and all questions were answered. Marland Kitchen Health Maintenance issues including appropriate healthy diet, exercise, and smoking avoidance were discussed with patient. . See orders for this visit as documented in the electronic medical record. . Patient received an After Visit Summary.  Briscoe Deutscher, DO [place] [place] 04/13/2018  Future Appointments  Date Time Provider [place]  04/13/2018  3:00 PM Briscoe Deutscher, DO [facility]    *** CMA served as scribe during this visit. History, Physical, and Plan performed by medical provider. The above documentation has been reviewed and is accurate and complete. Briscoe Deutscher, D.O.

## 2018-04-13 NOTE — Assessment & Plan Note (Signed)
Contributing to poor-healing wounds. Continue to monitor.

## 2018-04-13 NOTE — Patient Instructions (Signed)
It was great to see you!  Please tell the front desk that you would like to transfer care to either Dr. Jerline Pain or Inda Coke.  Continue Bactrim. Use bacitracin topical ointment daily.  Wound Care, Adult Taking care of your wound properly can help to prevent pain and infection. It can also help your wound to heal more quickly. How is this treated? Wound care  Follow instructions from your health care provider about how to take care of your wound. Make sure you: ? Wash your hands with soap and water before you change the bandage (dressing). If soap and water are not available, use hand sanitizer. ? Change your dressing as told by your health care provider. ? Leave stitches (sutures), skin glue, or adhesive strips in place. These skin closures may need to stay in place for 2 weeks or longer. If adhesive strip edges start to loosen and curl up, you may trim the loose edges. Do not remove adhesive strips completely unless your health care provider tells you to do that.  Check your wound area every day for signs of infection. Check for: ? More redness, swelling, or pain. ? More fluid or blood. ? Warmth. ? Pus or a bad smell.  Ask your health care provider if you should clean the wound with mild soap and water. Doing this may include: ? Using a clean towel to pat the wound dry after cleaning it. Do not rub or scrub the wound. ? Applying a cream or ointment. Do this only as told by your health care provider. ? Covering the incision with a clean dressing.  Ask your health care provider when you can leave the wound uncovered. Medicines   If you were prescribed an antibiotic medicine, cream, or ointment, take or use the antibiotic as told by your health care provider. Do not stop taking or using the antibiotic even if your condition improves.  Take over-the-counter and prescription medicines only as told by your health care provider. If you were prescribed pain medicine, take it at least  30 minutes before doing any wound care or as told by your health care provider. General instructions  Return to your normal activities as told by your health care provider. Ask your health care provider what activities are safe.  Do not scratch or pick at the wound.  Keep all follow-up visits as told by your health care provider. This is important.  Eat a diet that includes protein, vitamin A, vitamin C, and other nutrient-rich foods. These help the wound heal: ? Protein-rich foods include meat, dairy, beans, nuts, and other sources. ? Vitamin A-rich foods include carrots and dark green, leafy vegetables. ? Vitamin C-rich foods include citrus, tomatoes, and other fruits and vegetables. ? Nutrient-rich foods have protein, carbohydrates, fat, vitamins, or minerals. Eat a variety of healthy foods including vegetables, fruits, and whole grains. Contact a health care provider if:  You received a tetanus shot and you have swelling, severe pain, redness, or bleeding at the injection site.  Your pain is not controlled with medicine.  You have more redness, swelling, or pain around the wound.  You have more fluid or blood coming from the wound.  Your wound feels warm to the touch.  You have pus or a bad smell coming from the wound.  You have a fever or chills.  You are nauseous or you vomit.  You are dizzy. Get help right away if:  You have a red streak going away from your wound.  The edges of the wound open up and separate.  Your wound is bleeding and the bleeding does not stop with gentle pressure.  You have a rash.  You faint.  You have trouble breathing. This information is not intended to replace advice given to you by your health care provider. Make sure you discuss any questions you have with your health care provider. Document Released: 06/28/2008 Document Revised: 05/18/2016 Document Reviewed: 04/05/2016 Elsevier Interactive Patient Education  2017 [employer].

## 2018-04-13 NOTE — Assessment & Plan Note (Signed)
Controlled. Refill tegretol 400 mg daily. Check labs today.

## 2018-04-13 NOTE — Progress Notes (Signed)
John Hubbard is a 57 y.o. male here for a new problem.  I acted as a Education administrator for [employer], PA-C Anselmo Pickler, LPN  History of Present Illness:   Chief Complaint  Patient presents with  . Wound Check    left lowere leg  . Left leg pain    HPI   Wound/Vasculitis/Neuropathy Patient has a history of colon cancer. He states that during his treatment he developed a rash -- per chart review, he had a work-up at dermatology in May 2016 and was diagnosed with vasculitis. He also developed neuropathy.  Two-three weeks ago he developed a small sore on his L lower shin. He went to an urgent care and started Bactrim, he returned to the urgent care and received a second round of Bactrim, states that he is about halfway through his course. He states that the area has been slow to heal. He denies discharge from the area. He is using Bactroban to the area and keeping it covered with a bandage. Denies: fever, chills, purulent discharge, changes in numbness/tingling to area.  Anxiety/Bipolar Disorder States that he takes carbamazepine 400 mg at bedtime. States that he isn't exactly sure why he is on it, does state that it helps with his anxiety. He is about to go out of town and needs a refill soon.  GAD 7 : Generalized Anxiety Score 04/13/2018  Nervous, Anxious, on Edge 0  Control/stop worrying 0  Worry too much - different things 0  Trouble relaxing 0  Restless 0  Easily annoyed or irritable 1  Afraid - awful might happen 1  Total GAD 7 Score 2  Anxiety Difficulty Not difficult at all   Gout Takes allopurinol, 300 mg daily. He is traveling out of town and is need of a refill. He denies recent gout flare. Has not recently required his colchicine.    Past Medical History:  Diagnosis Date  . Anxiety   . Bipolar disorder ([place])   . Colon cancer ([facility])    hx colon cancer, surgery, chemotherapy -last 5'15  . Complication of anesthesia    woke-up during colonoscopy; difficult to wake   . Gout   . History of blood transfusion 2015  . Neuropathy      Social History   Socioeconomic History  . Marital status: Married    Spouse name: Not on file  . Number of children: Not on file  . Years of education: Not on file  . Highest education level: Not on file  Occupational History  . Not on file  Social Needs  . Financial resource strain: Not on file  . Food insecurity:    Worry: Not on file    Inability: Not on file  . Transportation needs:    Medical: Not on file    Non-medical: Not on file  Tobacco Use  . Smoking status: Never Smoker  . Smokeless tobacco: Never Used  Substance and Sexual Activity  . Alcohol use: Yes    Comment: rare  . Drug use: No  . Sexual activity: Not on file  Lifestyle  . Physical activity:    Days per week: Not on file    Minutes per session: Not on file  . Stress: Not on file  Relationships  . Social connections:    Talks on phone: Not on file    Gets together: Not on file    Attends religious service: Not on file    Active member of club or organization: Not on  file    Attends meetings of clubs or organizations: Not on file    Relationship status: Not on file  . Intimate partner violence:    Fear of current or ex partner: Not on file    Emotionally abused: Not on file    Physically abused: Not on file    Forced sexual activity: Not on file  Other Topics Concern  . Not on file  Social History Narrative  . Not on file    Past Surgical History:  Procedure Laterality Date  . COLONOSCOPY Bilateral 07/24/2013   Procedure: COLONOSCOPY;  Surgeon: [name], MD;  Location: [facility];  Service: Endoscopy;  Laterality: Bilateral;  . COLONOSCOPY WITH PROPOFOL N/A 10/23/2014   Procedure: COLONOSCOPY WITH PROPOFOL;  Surgeon: [name], MD;  Location: [facility];  Service: Endoscopy;  Laterality: N/A;  . EUS N/A 12/15/2016   Procedure: UPPER ENDOSCOPIC ULTRASOUND (EUS) LINEAR;  Surgeon: [name], MD;  Location: [facility];  Service: Endoscopy;  Laterality: N/A;  . HERNIA REPAIR  3/50/09   Umbilical Hernia Repair  . LAPAROSCOPIC PARTIAL COLECTOMY N/A 08/14/2013   Procedure: LAPAROSCOPIC PARTIAL COLECTOMY wound vac placement;  Surgeon: [name], MD;  Location: [facility];  Service: General;  Laterality: N/A;  . MEDIASTINOSCOPY N/A 12/28/2016   Procedure: MEDIASTINOSCOPY;  Surgeon: [name], MD;  Location: [facility];  Service: Thoracic;  Laterality: N/A;  . PORT-A-CATH REMOVAL N/A 05/14/2014   Procedure: REMOVAL PORT-A-CATH;  Surgeon: [name], MD;  Location: [facility];  Service: General;  Laterality: N/A;  . PORTACATH PLACEMENT Left 09/16/2013   Procedure: INSERTION PORT-A-CATH;  Surgeon: [name], MD;  Location: [facility];  Service: General;  Laterality: Left;  dressing change on lower abdominal wound  . VIDEO BRONCHOSCOPY WITH ENDOBRONCHIAL ULTRASOUND N/A 12/28/2016   Procedure: VIDEO BRONCHOSCOPY WITH ENDOBRONCHIAL ULTRASOUND;  Surgeon: [name], MD;  Location[facility];  Service: Thoracic;  Laterality: N/A;    Family History  Problem Relation Age of Onset  . Heart disease Father     Allergies  Allergen Reactions  . Penicillins Hives and Other (See Comments)    Has patient had a PCN reaction causing immediate rash, facial/tongue/throat swelling, SOB or lightheadedness with hypotension: No Has patient had a PCN reaction causing SEVERE RASH INVOLVING MUCUS MEMBRANES or SKIN NECROSIS: #  #  #  YES  #  #  #  Has patient had a PCN reaction that required hospitalization #  #  #  YES  #  #  #  Has patient had a PCN reaction occurring within the last 10 years: No     Current Medications:   Current Outpatient Medications:  .  allopurinol (ZYLOPRIM) 300 MG tablet, Take 1 tablet (300 mg total) by mouth daily., Disp: 90 tablet, Rfl: 1 .  carbamazepine (TEGRETOL) 200 MG tablet, Take 2 tablets (400 mg total) by mouth at bedtime., Disp: 60 tablet, Rfl: 2 .  colchicine 0.6  MG tablet, Take 0.6 mg by mouth 2 (two) times daily as needed (Gout). , Disp: , Rfl:  .  mupirocin ointment (BACTROBAN) 2 %, APPLY TO AFFECTED AREA 3 TIMES A DAY AS NEEDED, Disp: , Rfl: 0 .  sulfamethoxazole-trimethoprim (BACTRIM DS,SEPTRA DS) 800-160 MG tablet, , Disp: , Rfl:    Review of Systems:   ROS    Vitals:   Vitals:   04/13/18 1533  BP: 134/86  Pulse: 79  Temp: 98.7 F (37.1 C)  TempSrc: Oral  SpO2: 95%  Weight: (!) 303 lb 6.1 oz (137.6 kg)  Height: 5\' 10"  (1.778 m)     Body mass index is 43.53 kg/m.  Physical Exam:   Physical Exam  Constitutional: He appears well-developed. He is cooperative.  Non-toxic appearance. He does not have a sickly appearance. He does not appear ill. No distress.  Cardiovascular: Normal rate, regular rhythm, S1 normal, S2 normal, normal heart sounds and normal pulses.  No LE edema  Pulmonary/Chest: Effort normal and breath sounds normal.  Neurological: He is alert. GCS eye subscore is 4. GCS verbal subscore is 5. GCS motor subscore is 6.  Skin: Skin is warm, dry and intact.  Approximately quarter-sized area of superficial skin loss to L anterior distal shin. Area with granulation tissue present. No purulent discharge present. No surrounding warmth. Mild erythema around site.  Psychiatric: He has a normal mood and affect. His speech is normal and behavior is normal.  Nursing note and vitals reviewed.   Assessment and Plan:    Problem List Items Addressed This Visit      Cardiovascular and Mediastinum   Vasculitis ([place])    Contributing to poor-healing wounds. Continue to monitor.        Nervous and Auditory   Neuropathy    Controlled. Currently not on medication for this.        Musculoskeletal and Integument   Chronic gout involving toe without tophus    Repeat serum uric acid today. Will refill allopurinol 300 mg daily.      Relevant Medications   allopurinol (ZYLOPRIM) 300 MG tablet   Other Relevant Orders   Uric  acid     Other   Bipolar disorder ([place])    Controlled. Refill tegretol 400 mg daily. Check labs today.      Anxiety    Controlled. Tegretol 400 mg daily.      Relevant Orders   CBC   Comprehensive metabolic panel   Wound of left leg - Primary    Also reviewed by Dr. Jerline Pain. Appears to be healing well. Continue current course of Bactrim. Apply bacitracin (packets provided in office) to area and keep covered. Clean with mild soap. Follow-up in 2-3 weeks with me or Dr. Jerline Pain to assess healing, sooner if needed.      Relevant Orders   CBC    Other Visit Diagnoses    Encounter for screening for other viral diseases       Relevant Orders   Hepatitis C Antibody   Screening for HIV (human immunodeficiency virus)       Relevant Orders   HIV antibody      . Reviewed expectations re: course of current medical issues. . Discussed self-management of symptoms. . Outlined signs and symptoms indicating need for more acute intervention. . Patient verbalized understanding and all questions were answered. . See orders for this visit as documented in the electronic medical record. . Patient received an After-Visit Summary.  CMA or LPN served as scribe during this visit. History, Physical, and Plan performed by medical provider. Documentation and orders reviewed and attested to.   Inda Coke, PA-C

## 2018-04-13 NOTE — Assessment & Plan Note (Signed)
Also reviewed by Dr. Jerline Pain. Appears to be healing well. Continue current course of Bactrim. Apply bacitracin (packets provided in office) to area and keep covered. Clean with mild soap. Follow-up in 2-3 weeks with me or Dr. Jerline Pain to assess healing, sooner if needed.

## 2018-04-16 ENCOUNTER — Telehealth: Payer: Self-pay | Admitting: Family Medicine

## 2018-04-16 ENCOUNTER — Other Ambulatory Visit: Payer: Self-pay | Admitting: Physician Assistant

## 2018-04-16 DIAGNOSIS — R7309 Other abnormal glucose: Secondary | ICD-10-CM

## 2018-04-16 NOTE — Telephone Encounter (Signed)
Copied from [facility] [id number]. Topic: Quick Communication - See Telephone Encounter >> Apr 16, 2018 12:58 PM Hewitt Shorts wrote: Pt is  needing refills on mupirocin and sulfamethoxale-trimethoprim  patient thought he had enough of both meds when he was seen today but he got home and realized he did not   Best number is [phone]    [phone]

## 2018-04-16 NOTE — Telephone Encounter (Signed)
Pt seen for OV today with Inda Coke, PA. Pt requesting a refill of Mupirocin and Sulfamethoxale-trimethoprim [facility] [place].

## 2018-04-17 ENCOUNTER — Encounter: Payer: Self-pay | Admitting: Family Medicine

## 2018-04-17 ENCOUNTER — Ambulatory Visit: Payer: Federal, State, Local not specified - PPO | Admitting: Family Medicine

## 2018-04-17 VITALS — BP 130/82 | HR 83 | Temp 99.3°F | Ht 70.0 in | Wt 299.6 lb

## 2018-04-17 DIAGNOSIS — S81802A Unspecified open wound, left lower leg, initial encounter: Secondary | ICD-10-CM | POA: Diagnosis not present

## 2018-04-17 DIAGNOSIS — R739 Hyperglycemia, unspecified: Secondary | ICD-10-CM | POA: Diagnosis not present

## 2018-04-17 DIAGNOSIS — C189 Malignant neoplasm of colon, unspecified: Secondary | ICD-10-CM | POA: Diagnosis not present

## 2018-04-17 DIAGNOSIS — F319 Bipolar disorder, unspecified: Secondary | ICD-10-CM | POA: Diagnosis not present

## 2018-04-17 DIAGNOSIS — F419 Anxiety disorder, unspecified: Secondary | ICD-10-CM | POA: Diagnosis not present

## 2018-04-17 DIAGNOSIS — E119 Type 2 diabetes mellitus without complications: Secondary | ICD-10-CM | POA: Insufficient documentation

## 2018-04-17 DIAGNOSIS — E1165 Type 2 diabetes mellitus with hyperglycemia: Secondary | ICD-10-CM | POA: Insufficient documentation

## 2018-04-17 LAB — URIC ACID: URIC ACID, SERUM: 5.3 mg/dL (ref 4.0–8.0)

## 2018-04-17 LAB — HIV ANTIBODY (ROUTINE TESTING W REFLEX): HIV: NONREACTIVE

## 2018-04-17 LAB — COMPREHENSIVE METABOLIC PANEL
AG Ratio: 1.7 (calc) (ref 1.0–2.5)
ALKALINE PHOSPHATASE (APISO): 135 U/L — AB (ref 40–115)
ALT: 30 U/L (ref 9–46)
AST: 20 U/L (ref 10–35)
Albumin: 4.6 g/dL (ref 3.6–5.1)
BILIRUBIN TOTAL: 0.4 mg/dL (ref 0.2–1.2)
BUN: 23 mg/dL (ref 7–25)
CALCIUM: 9.5 mg/dL (ref 8.6–10.3)
CO2: 27 mmol/L (ref 20–32)
Chloride: 99 mmol/L (ref 98–110)
Creat: 1.01 mg/dL (ref 0.70–1.33)
Globulin: 2.7 g/dL (calc) (ref 1.9–3.7)
Glucose, Bld: 144 mg/dL — ABNORMAL HIGH (ref 65–99)
Potassium: 4.5 mmol/L (ref 3.5–5.3)
Sodium: 137 mmol/L (ref 135–146)
Total Protein: 7.3 g/dL (ref 6.1–8.1)

## 2018-04-17 LAB — CBC
HEMATOCRIT: 44.5 % (ref 38.5–50.0)
Hemoglobin: 15.1 g/dL (ref 13.2–17.1)
MCH: 27.9 pg (ref 27.0–33.0)
MCHC: 33.9 g/dL (ref 32.0–36.0)
MCV: 82.3 fL (ref 80.0–100.0)
MPV: 10.5 fL (ref 7.5–12.5)
Platelets: 203 10*3/uL (ref 140–400)
RBC: 5.41 10*6/uL (ref 4.20–5.80)
RDW: 12.9 % (ref 11.0–15.0)
WBC: 7 10*3/uL (ref 3.8–10.8)

## 2018-04-17 LAB — HEPATITIS C ANTIBODY
Hepatitis C Ab: NONREACTIVE
SIGNAL TO CUT-OFF: 0.02 (ref <1.00)

## 2018-04-17 LAB — TEST AUTHORIZATION

## 2018-04-17 LAB — HEMOGLOBIN A1C W/OUT EAG: Hgb A1c MFr Bld: 8.3 % of total Hgb — ABNORMAL HIGH (ref <5.7)

## 2018-04-17 MED ORDER — METFORMIN HCL ER 750 MG PO TB24: 750.0000 mg | ORAL_TABLET | Freq: Every day | ORAL | 2 refills | Status: DC

## 2018-04-17 MED ORDER — SULFAMETHOXAZOLE-TRIMETHOPRIM 800-160 MG PO TABS: 1.0000 | ORAL_TABLET | Freq: Two times a day (BID) | ORAL | 0 refills | Status: DC

## 2018-04-17 NOTE — Assessment & Plan Note (Signed)
Stable.  Continue Tegretol 400 mg nightly.

## 2018-04-17 NOTE — Telephone Encounter (Signed)
Spoke to pt, told him he does not need anymore antibiotics after completing present course per [facility]. Pt verbalized understanding. Asked pt if he heard my previous message? Pt said yes and he understands importance of getting in to discuss labs. Asked pt if he can come in this week? Pt said needs late appt. Told him okay, Appt scheduled for today at 4:00 PM with Dr. Jerline Pain. Pt verbalized understanding. Told pt I can send Rx for ointment to pharmacy.Pt said he does not need any has enough. Told pt okay.

## 2018-04-17 NOTE — Assessment & Plan Note (Signed)
Stable.  Continue Tegretol 400 mg daily. 

## 2018-04-17 NOTE — Assessment & Plan Note (Signed)
Seems to be healing.  He will continue with wound care.  No signs of cellulitis today.  I will send in a refill for his Bactrim with strict instruction to not start unless he develops worsening redness or purulence.  Hopefully wound will heal as we improved his blood sugars.  Discussed reasons to return to care.

## 2018-04-17 NOTE — Patient Instructions (Signed)
It was very nice to see you today!  You have an elevated A1c.  This is a 32-month average of your blood sugars.  You are currently in the diabetic range.  It is possible that your recent infection could have elevated this reading.  Please start the metformin. Take one pill daily.  Let me know if you have significant side effects.  Please restart the oral antibiotic if the redness on your leg worsens.   Come back in 3 months to have your A1c rechecked.   Take care, Dr Jerline Pain   Diet Recommendations for Diabetes   Starchy (carb) foods: Bread, rice, pasta, potatoes, corn, cereal, grits, crackers, bagels, muffins, all baked goods.  (Fruits, milk, and yogurt also have carbohydrate, but most of these foods will not spike your blood sugar as the starchy foods will.)  A few fruits do cause high blood sugars; use small portions of bananas (limit to 1/2 at a time), grapes, watermelon, oranges, and most tropical fruits.    Protein foods: Meat, fish, poultry, eggs, dairy foods, and beans such as pinto and kidney beans (beans also provide carbohydrate).   1. Eat at least 3 meals and 1-2 snacks per day. Never go more than 4-5 hours while awake without eating. Eat breakfast within the first hour of getting up.   2. Limit starchy foods to TWO per meal and ONE per snack. ONE portion of a starchy  food is equal to the following:   - ONE slice of bread (or its equivalent, such as half of a hamburger bun).   - 1/2 cup of a "scoopable" starchy food such as potatoes or rice.   - 15 grams of carbohydrate as shown on food label.  3. Include at every meal: a protein food, a carb food, and vegetables and/or fruit.   - Obtain twice the volume of veg's as protein or carbohydrate foods for both lunch and dinner.   - Fresh or frozen veg's are best.   - Keep frozen veg's on hand for a quick vegetable serving.

## 2018-04-17 NOTE — Progress Notes (Signed)
   Subjective:  John Hubbard is a 57 y.o. male who presents today with a chief complaint of hyperglycemia follow up.   HPI:  Hyperglycemia, new problem Patient seen 4 days ago for lower extremity wound. Had blood work at that time which revealed an A1c of 8.3%.  He has never been diagnosed with diabetes in the past.  No reported polyuria polydipsia.  Recently had right lower extremity infection that was treated with 2 courses of antibiotics.  He has never been on any medications in the past.  Left lower leg wound, established problem, improving Patient seen 4 days ago for this.  He has finished his course of Bactrim.  Symptoms seem to be improved.  Anxiety/bipolar disorder Several year history.  Symptoms are stable on carbamazepine 400 mg daily.  No reported side effects to this medication.  ROS: Per HPI  PMH: He reports that he has never smoked. He has never used smokeless tobacco. He reports that he drinks alcohol. He reports that he does not use drugs.  Objective:  Physical Exam: BP 130/82 (BP Location: Left Arm, Patient Position: Sitting, Cuff Size: Large)   Pulse 83   Temp 99.3 F (37.4 C) (Oral)   Ht 5\' 10"  (1.778 m)   Wt 299 lb 9.6 oz (135.9 kg)   SpO2 94%   BMI 42.99 kg/m   Gen: NAD, resting comfortably CV: RRR with no murmurs appreciated Pulm: NWOB, CTAB with no crackles, wheezes, or rhonchi MSK: -Left lower extremity: Approximately 2 cm open wound with granulation tissue present.  Mild amount of surrounding erythema.  Assessment/Plan:  Wound of left leg Seems to be healing.  He will continue with wound care.  No signs of cellulitis today.  I will send in a refill for his Bactrim with strict instruction to not start unless he develops worsening redness or purulence.  Hopefully wound will heal as we improved his blood sugars.  Discussed reasons to return to care.  Colon cancer Stable.  Follows with oncology for this.  Hyperglycemia Patient with elevated A1c in  setting of recent leg infection.  We will start metformin 750 mg daily.  Discussed potential side effects this medication.  He will follow-up with me in 3 months for repeat A1c.  Discussed lifestyle modifications including diabetic diet and regular exercise.  Bipolar disorder ([place]) Stable.  Continue Tegretol 400 mg nightly.  Anxiety Stable.  Continue Tegretol 400 mg daily.   [name]. Jerline Pain, MD 04/17/2018 5:14 PM

## 2018-04-17 NOTE — Assessment & Plan Note (Signed)
Stable.  Follows with oncology for this.

## 2018-04-17 NOTE — Assessment & Plan Note (Signed)
Patient with elevated A1c in setting of recent leg infection.  We will start metformin 750 mg daily.  Discussed potential side effects this medication.  He will follow-up with me in 3 months for repeat A1c.  Discussed lifestyle modifications including diabetic diet and regular exercise.

## 2018-05-03 ENCOUNTER — Ambulatory Visit: Payer: Federal, State, Local not specified - PPO | Admitting: Family Medicine

## 2018-05-04 ENCOUNTER — Telehealth: Payer: Self-pay | Admitting: Family Medicine

## 2018-05-04 NOTE — Telephone Encounter (Signed)
Patient notified of recommendations and will call if he notices any worsening signs of infection.

## 2018-05-04 NOTE — Telephone Encounter (Signed)
Copied from [place] #[id number]. Topic: Inquiry >> May 04, 2018  3:09 PM Oliver Pila B wrote: Reason for CRM: pt called to state that he is having a little pain w/ his leg but pt states the wound is healing nicely w/ no signs of infection; pt was looking as well to get another round of ABX if pcp thought it was needed

## 2018-05-04 NOTE — Telephone Encounter (Signed)
Please advise 

## 2018-05-04 NOTE — Telephone Encounter (Signed)
See note

## 2018-05-04 NOTE — Telephone Encounter (Signed)
Difficult to evaluate without seeing in person. It is normal to have a small amount of redness to the area as part of the healing process.  I would be ok with waiting off the antibiotic for a few days. If he starts having worsening redness, fevers, chills, or another signs of worsening infection he should let us know.  [name]. Jerline Pain, MD 05/04/2018 4:34 PM

## 2018-05-11 ENCOUNTER — Other Ambulatory Visit: Payer: Self-pay | Admitting: Physician Assistant

## 2018-07-10 ENCOUNTER — Other Ambulatory Visit: Payer: Self-pay | Admitting: Family Medicine

## 2018-07-19 ENCOUNTER — Ambulatory Visit: Payer: Federal, State, Local not specified - PPO | Admitting: Family Medicine

## 2018-08-03 ENCOUNTER — Ambulatory Visit: Payer: Federal, State, Local not specified - PPO | Admitting: Family Medicine

## 2018-08-16 ENCOUNTER — Ambulatory Visit: Payer: Federal, State, Local not specified - PPO | Admitting: Family Medicine

## 2018-08-23 DIAGNOSIS — K080 Exfoliation of teeth due to systemic causes: Secondary | ICD-10-CM | POA: Diagnosis not present

## 2018-10-29 ENCOUNTER — Other Ambulatory Visit: Payer: Self-pay | Admitting: Physician Assistant

## 2018-10-29 DIAGNOSIS — M1A9XX0 Chronic gout, unspecified, without tophus (tophi): Secondary | ICD-10-CM

## 2018-10-31 ENCOUNTER — Other Ambulatory Visit: Payer: Self-pay | Admitting: Family Medicine

## 2018-11-16 ENCOUNTER — Telehealth: Payer: Self-pay | Admitting: Oncology

## 2018-11-16 NOTE — Telephone Encounter (Signed)
Patient called to reschedule  °

## 2018-11-29 DIAGNOSIS — K080 Exfoliation of teeth due to systemic causes: Secondary | ICD-10-CM | POA: Diagnosis not present

## 2018-12-31 ENCOUNTER — Telehealth: Payer: Self-pay | Admitting: *Deleted

## 2018-12-31 NOTE — Telephone Encounter (Signed)
VM to cancel his appointments on 01/01/19. Will move out 2 months per additional precautions to protect chemo patients to community. Also sent Mychart message. Scheduling message sent as well.

## 2019-01-01 ENCOUNTER — Inpatient Hospital Stay: Payer: Federal, State, Local not specified - PPO

## 2019-01-01 ENCOUNTER — Inpatient Hospital Stay: Payer: Federal, State, Local not specified - PPO | Admitting: Oncology

## 2019-01-01 ENCOUNTER — Telehealth: Payer: Self-pay | Admitting: Oncology

## 2019-01-01 NOTE — Telephone Encounter (Signed)
Scheduled apt per 3/30 sch message - unable to reach patient - sent letter in the mail

## 2019-01-22 ENCOUNTER — Other Ambulatory Visit: Payer: Self-pay

## 2019-01-22 MED ORDER — CARBAMAZEPINE 200 MG PO TABS: 400.0000 mg | ORAL_TABLET | Freq: Every day | ORAL | 0 refills | Status: DC

## 2019-02-20 ENCOUNTER — Other Ambulatory Visit: Payer: Self-pay | Admitting: *Deleted

## 2019-02-20 DIAGNOSIS — C189 Malignant neoplasm of colon, unspecified: Secondary | ICD-10-CM

## 2019-02-21 ENCOUNTER — Telehealth: Payer: Self-pay | Admitting: Oncology

## 2019-02-21 ENCOUNTER — Inpatient Hospital Stay: Payer: Federal, State, Local not specified - PPO | Attending: Oncology

## 2019-02-21 ENCOUNTER — Inpatient Hospital Stay (HOSPITAL_BASED_OUTPATIENT_CLINIC_OR_DEPARTMENT_OTHER): Payer: Federal, State, Local not specified - PPO | Admitting: Oncology

## 2019-02-21 ENCOUNTER — Other Ambulatory Visit: Payer: Self-pay

## 2019-02-21 ENCOUNTER — Encounter: Payer: Self-pay | Admitting: Oncology

## 2019-02-21 VITALS — BP 153/95 | HR 82 | Temp 97.8°F | Resp 18 | Ht 70.0 in | Wt 298.9 lb

## 2019-02-21 DIAGNOSIS — M109 Gout, unspecified: Secondary | ICD-10-CM

## 2019-02-21 DIAGNOSIS — R21 Rash and other nonspecific skin eruption: Secondary | ICD-10-CM

## 2019-02-21 DIAGNOSIS — E119 Type 2 diabetes mellitus without complications: Secondary | ICD-10-CM | POA: Diagnosis not present

## 2019-02-21 DIAGNOSIS — R110 Nausea: Secondary | ICD-10-CM | POA: Diagnosis not present

## 2019-02-21 DIAGNOSIS — G620 Drug-induced polyneuropathy: Secondary | ICD-10-CM | POA: Diagnosis not present

## 2019-02-21 DIAGNOSIS — I10 Essential (primary) hypertension: Secondary | ICD-10-CM

## 2019-02-21 DIAGNOSIS — C189 Malignant neoplasm of colon, unspecified: Secondary | ICD-10-CM

## 2019-02-21 DIAGNOSIS — F419 Anxiety disorder, unspecified: Secondary | ICD-10-CM | POA: Insufficient documentation

## 2019-02-21 DIAGNOSIS — L98499 Non-pressure chronic ulcer of skin of other sites with unspecified severity: Secondary | ICD-10-CM | POA: Diagnosis not present

## 2019-02-21 DIAGNOSIS — I776 Arteritis, unspecified: Secondary | ICD-10-CM | POA: Diagnosis not present

## 2019-02-21 NOTE — Progress Notes (Signed)
[name]   Diagnosis: Colon cancer  INTERVAL HISTORY:   John Hubbard was last seen at the cancer center in 2018.  He reports feeling well.  Good appetite and energy level.  He lost a significant amount of weight with a new diet, but has gained weight recently.  No shortness of breath.  He has mild tingling in the feet.  This does not interfere with activity. He recently had an ulcer at the left ankle.  He reports being evaluated in urgent care and treated with antibiotics.  The ulcer is healing. He has not undergone a surveillance colonoscopy since 2016.  Objective: Limited physical exam secondary to distancing with the COVID pandemic  Vital signs in last 24 hours:  Blood pressure (!) 153/95, pulse 82, temperature 97.8 F (36.6 C), temperature source Oral, resp. rate 18, height '5\' 10"'  (1.778 m), weight 298 lb 14.4 oz (135.6 kg), SpO2 96 %.    HEENT: Neck without mass Lymphatics: No cervical, supraclavicular, axillary, or inguinal nodes GI: No hepatomegaly, no mass, nontender Vascular: Chronic stasis change of the lower leg bilaterally, trace edema at the left ankle  Skin: Healing superficial ulcer at the lateral left ankle    Lab Results:  Lab Results  Component Value Date   WBC 7.0 04/13/2018   HGB 15.1 04/13/2018   HCT 44.5 04/13/2018   MCV 82.3 04/13/2018   PLT 203 04/13/2018   NEUTROABS 3.9 10/28/2016    CMP  Lab Results  Component Value Date   NA 137 04/13/2018   K 4.5 04/13/2018   CL 99 04/13/2018   CO2 27 04/13/2018   GLUCOSE 144 (H) 04/13/2018   BUN 23 04/13/2018   CREATININE 1.01 04/13/2018   CALCIUM 9.5 04/13/2018   PROT 7.3 04/13/2018   ALBUMIN 3.8 12/28/2016   AST 20 04/13/2018   ALT 30 04/13/2018   ALKPHOS 128 (H) 12/28/2016   BILITOT 0.4 04/13/2018   GFRNONAA >60 12/28/2016   GFRAA >60 12/28/2016    Lab Results  Component Value Date   CEA1 2.84 04/28/2017    Medications: I have reviewed the  patient's current medications.   Assessment/Plan: 1. 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 The tumor returned microsatellite stable with no loss of mismatch repair protein expression.  Cycle 1 adjuvant CAPOX beginning 09/18/2013.   Cycle 7 CAPOX 01/22/2014   Cycle 8 CAPOX 02/12/2014 (oxaliplatin deleted).  Surveillance colonoscopy January 2016  CT scans chest, abdomen and pelvis 08/15/2014 with no evidence of metastatic disease.  CT scans 11/15/2016-interval development of mediastinal adenopathy. Enlarging nodule within right upper lobe. No mass or adenopathy identified within the abdomen or pelvis.  PET scan 12/05/2016-FDG avid seen in the mediastinum and hila. Nodule in the medial right upper lobe increased since 2015, no abnormal FDG uptake in the region of the nodule. Epicardial node inferior to the liver dome FDG avid. Porta hepatis and portacaval nodes FDG avid as well. Also FDG avid node anterior to the right side of the crus.  EUS biopsy of a portacaval node on 12/15/2016-nondiagnostic  Bronchoscopy/mediastinoscopy 12/28/2016-biopsies of level 7 and leve l4R nodes negative for malignancy. Changes consistent with a fibrotic lymph node-potentially old granulomas identified on the level 7 node 2. Gout. He continues allopurinol. 3. History of anxiety maintained on Tegretol. 4. Hypertension. 5. History of delayed nausea. Improved with Aloxi/Emend 6. Rash-the rash occurred following cycle 7 CAPOX. It is possible the  rash was related to capecitabine. Improved.  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  Disposition: [name] is in remission from colon cancer.  He is now greater than 5 years out from diagnosis.  He is overdue for a surveillance colonoscopy.  I will refer him to Dr. Marcello Moores.  He  would like to continue follow-up at the Cancer center.  He will return for an office visit in 1 year. I encouraged him to follow-up with his primary physician for evaluation of hypertension, diabetes, and the skin ulcer.  I recommend he have a chest CT to follow-up on the history of chest lymphadenopathy and a lung nodule.  We will recommend he schedule this for within the next 1 month.  Betsy Coder, MD  02/21/2019  3:13 PM

## 2019-02-21 NOTE — Telephone Encounter (Signed)
Scheduled appt per 5/21 los. °

## 2019-02-22 ENCOUNTER — Telehealth: Payer: Self-pay | Admitting: Medical Oncology

## 2019-02-22 LAB — CEA (IN HOUSE-CHCC): CEA (CHCC-In House): 2.81 ng/mL (ref 0.00–5.00)

## 2019-02-22 NOTE — Telephone Encounter (Signed)
LVM on pts phone - Colonoscopy-Dr Benay Spice made referral to Dr A. Thomas for surveillance colonoscopy .  Lab and Ct Chest  in 2 weeks-He reviewed his chart and said you need a Chest CT to f/u the chest lymph nodes and a lung nodule.  You also need a lab appt the day of the CT scan . The orders are placed.  Pt  should expect a call for the lab and CT appt and a call from Dr A. [place] office for [employer].

## 2019-03-01 ENCOUNTER — Telehealth: Payer: Self-pay | Admitting: *Deleted

## 2019-03-01 NOTE — Telephone Encounter (Signed)
Provided patient with CEA results. Informed him PA is done, so his CT chest can be scheduled now. He requested central scheduling # to call and schedule it himself. Informed him he will need a lab that day as well for renal function clearance. He will call to schedule his scan later in month--going on vacation next week. He will call when scheduled so lab can be moved to same day. Provided him phone # for Dr. [name] so he can reschedule his appointment with her as well.

## 2019-03-01 NOTE — Telephone Encounter (Signed)
-----   Message from Ladell Pier, MD sent at 02/22/2019  9:10 AM EDT ----- Please call patient, cea is normal

## 2019-03-07 ENCOUNTER — Ambulatory Visit (HOSPITAL_COMMUNITY): Payer: Federal, State, Local not specified - PPO

## 2019-03-07 ENCOUNTER — Inpatient Hospital Stay: Payer: Federal, State, Local not specified - PPO | Attending: Oncology

## 2019-04-15 ENCOUNTER — Ambulatory Visit: Payer: Self-pay | Admitting: General Surgery

## 2019-04-15 DIAGNOSIS — Z85038 Personal history of other malignant neoplasm of large intestine: Secondary | ICD-10-CM | POA: Diagnosis not present

## 2019-04-15 NOTE — H&P (Signed)
History of Present Illness John Ruff MD; [birth year]/33[birth year]51 4:06 PM) The patient is a 58 year old male who presents with colorectal cancer. Patient with a history of stage III colon cancer, status post low anterior resection in November 2014. He underwent adjuvant chemotherapy. CT scan shows signs of metastatic disease. He was due for surveillance colonoscopy in 2019. He was referred to me to get this done.  Past Medical History:  Diagnosis Date  . Anxiety   . Bipolar disorder ([place])   . Colon cancer ([facility])    hx colon cancer, surgery, chemotherapy -last 5'15  . Complication of anesthesia    woke-up during colonoscopy; difficult to wake  . Gout   . History of blood transfusion 2015  . Neuropathy    Past Surgical History:  Procedure Laterality Date  . COLONOSCOPY Bilateral 07/24/2013   Procedure: COLONOSCOPY;  Surgeon: John Ruff, MD;  Location: [facility];  Service: Endoscopy;  Laterality: Bilateral;  . COLONOSCOPY WITH PROPOFOL N/A 10/23/2014   Procedure: COLONOSCOPY WITH PROPOFOL;  Surgeon: John Ruff, MD;  Location: [facility];  Service: Endoscopy;  Laterality: N/A;  . EUS N/A 12/15/2016   Procedure: UPPER ENDOSCOPIC ULTRASOUND (EUS) LINEAR;  Surgeon: [name], MD;  Location: [facility];  Service: Endoscopy;  Laterality: N/A;  . HERNIA REPAIR  8/84/16   Umbilical Hernia Repair  . LAPAROSCOPIC PARTIAL COLECTOMY N/A 08/14/2013   Procedure: LAPAROSCOPIC PARTIAL COLECTOMY wound vac placement;  Surgeon: John Ruff, MD;  Location: [facility];  Service: General;  Laterality: N/A;  . MEDIASTINOSCOPY N/A 12/28/2016   Procedure: MEDIASTINOSCOPY;  Surgeon: [name], MD;  Location: [place];  Service: Thoracic;  Laterality: N/A;  . PORT-A-CATH REMOVAL N/A 05/14/2014   Procedure: REMOVAL PORT-A-CATH;  Surgeon: John Ruff, MD;  Location: [facility];  Service: General;  Laterality: N/A;  . PORTACATH PLACEMENT Left 09/16/2013   Procedure: INSERTION PORT-A-CATH;  Surgeon: John Ruff, MD;  Location: [place];  Service: General;  Laterality: Left;  dressing change on lower abdominal wound  . VIDEO BRONCHOSCOPY WITH ENDOBRONCHIAL ULTRASOUND N/A 12/28/2016   Procedure: VIDEO BRONCHOSCOPY WITH ENDOBRONCHIAL ULTRASOUND;  Surgeon: [name], MD;  Location: [facility] OR;  Service: Thoracic;  Laterality: N/A;   Family History  Problem Relation Age of Onset  . Heart disease Father    Social History   Socioeconomic History  . Marital status: Married    Spouse name: Not on file  . Number of children: Not on file  . Years of education: Not on file  . Highest education level: Not on file  Occupational History  . Not on file  Social Needs  . Financial resource strain: Not on file  . Food insecurity    Worry: Not on file    Inability: Not on file  . Transportation needs    Medical: Not on file    Non-medical: Not on file  Tobacco Use  . Smoking status: Never Smoker  . Smokeless tobacco: Never Used  Substance and Sexual Activity  . Alcohol use: Yes    Comment: rare  . Drug use: No  . Sexual activity: Not on file  Lifestyle  . Physical activity    Days per week: Not on file    Minutes per session: Not on file  . Stress: Not on file  Relationships  . Social Herbalist on phone: Not on file    Gets together: Not on file    Attends religious service: Not on  file    Active member of club or organization: Not on file    Attends meetings of clubs or organizations: Not on file    Relationship status: Not on file  . Intimate partner violence    Fear of current or ex partner: Not on file    Emotionally abused: Not on file    Physically abused: Not on file    Forced sexual activity: Not on file  Other Topics Concern  . Not on file  Social History Narrative  . Not on file    Allergies Mammie Lorenzo, LPN; 6/33/3545 6:25 PM) Penicillins Hives.  Medication History Mammie Lorenzo, LPN; 6/38/9373 4:28 PM) TEGretol (200MG Tablet,  476m Oral) Active. Allopurinol (100MG Tablet, Oral) Active. Medications Reconciled  Review of Systems - General ROS: negative for - chills, fatigue or fever Respiratory ROS: no cough, shortness of breath, or wheezing Cardiovascular ROS: no chest pain or dyspnea on exertion Gastrointestinal ROS: no abdominal pain, change in bowel habits, or black or bloody stools    Vitals (Claiborne BillingsDockery LPN; 77/68/115732:62PM) 04/15/2019 3:56 PM Weight: 287.4 lb Height: 70in Body Surface Area: 2.44 m Body Mass Index: 41.24 kg/m  Temp.: 11F(Oral)  Pulse: 98 (Regular)  BP: 148/84 (Sitting, Left Arm, Standard)   Physical Exam (John RuffMD; 70/35/59744:07 PM)  General Mental Status-Alert. General Appearance-Not in acute distress. Build & Nutrition-Well nourished. Posture-Normal posture. Gait-Normal.  Head and Neck Head-normocephalic, atraumatic with no lesions or palpable masses. Trachea-midline.  Chest and Lung Exam Chest and lung exam reveals -on auscultation, normal breath sounds, no adventitious sounds and normal vocal resonance.  Cardiovascular Cardiovascular examination reveals -normal heart sounds, regular rate and rhythm with no murmurs and no digital clubbing, cyanosis, edema, increased warmth or tenderness.  Abdomen Inspection Inspection of the abdomen reveals - No Hernias. Palpation/Percussion Palpation and Percussion of the abdomen reveal - Soft, Non Tender, No Rigidity (guarding), No hepatosplenomegaly and No Palpable abdominal masses.  Neurologic Neurologic evaluation reveals -alert and oriented x 3 with no impairment of recent or remote memory, normal attention span and ability to concentrate, normal sensation and normal coordination.  Musculoskeletal Normal Exam - Bilateral-Upper Extremity Strength Normal and Lower Extremity Strength Normal.      Assessment & Plan (John RuffMD; 71/63/84534:05 PM)  HISTORY OF COLON  CANCER (Z85.038) Impression: 58year old white male who presents to the office today for follow-up. History of stage III colon cancer status post low anterior resection in November 2014. He completed adjuvant chemotherapy and underwent a surveillance colonoscopy in January 2016. She'll follow-up. He is overdue for surveillance colonoscopy. We will get this scheduled for him. Discussed risk of bleeding, perforation, missed pathology and inability to evaluate the entire colon. I believe he understands and wishes to proceed with colonoscopy. We will plan on doing this under [facility], as this is how he had his other colonoscopies and he is high risk for airway issues.

## 2019-04-16 ENCOUNTER — Inpatient Hospital Stay: Payer: Federal, State, Local not specified - PPO | Attending: Oncology

## 2019-04-16 ENCOUNTER — Other Ambulatory Visit: Payer: Self-pay

## 2019-04-16 ENCOUNTER — Ambulatory Visit (HOSPITAL_COMMUNITY)
Admission: RE | Admit: 2019-04-16 | Discharge: 2019-04-16 | Disposition: A | Discharge status: Home / Self Care | Payer: Federal, State, Local not specified - PPO | Source: Ambulatory Visit | Attending: Oncology | Admitting: Oncology

## 2019-04-16 DIAGNOSIS — M109 Gout, unspecified: Secondary | ICD-10-CM | POA: Diagnosis not present

## 2019-04-16 DIAGNOSIS — Z79899 Other long term (current) drug therapy: Secondary | ICD-10-CM | POA: Diagnosis not present

## 2019-04-16 DIAGNOSIS — R202 Paresthesia of skin: Secondary | ICD-10-CM | POA: Diagnosis not present

## 2019-04-16 DIAGNOSIS — G620 Drug-induced polyneuropathy: Secondary | ICD-10-CM | POA: Insufficient documentation

## 2019-04-16 DIAGNOSIS — C189 Malignant neoplasm of colon, unspecified: Secondary | ICD-10-CM

## 2019-04-16 DIAGNOSIS — T451X5A Adverse effect of antineoplastic and immunosuppressive drugs, initial encounter: Secondary | ICD-10-CM | POA: Diagnosis not present

## 2019-04-16 DIAGNOSIS — I10 Essential (primary) hypertension: Secondary | ICD-10-CM | POA: Insufficient documentation

## 2019-04-16 DIAGNOSIS — C187 Malignant neoplasm of sigmoid colon: Secondary | ICD-10-CM | POA: Diagnosis not present

## 2019-04-16 DIAGNOSIS — R911 Solitary pulmonary nodule: Secondary | ICD-10-CM | POA: Diagnosis not present

## 2019-04-16 LAB — BASIC METABOLIC PANEL - CANCER CENTER ONLY
Anion gap: 21 — ABNORMAL HIGH (ref 5–15)
BUN: 18 mg/dL (ref 6–20)
CO2: 19 mmol/L — ABNORMAL LOW (ref 22–32)
Calcium: 9.8 mg/dL (ref 8.9–10.3)
Chloride: 97 mmol/L — ABNORMAL LOW (ref 98–111)
Creatinine: 1.17 mg/dL (ref 0.61–1.24)
GFR, Est AFR Am: >60 mL/min (ref >60)
GFR, Estimated: >60 mL/min (ref >60)
Glucose, Bld: 437 mg/dL — ABNORMAL HIGH (ref 70–99)
Potassium: 4.4 mmol/L (ref 3.5–5.1)
Sodium: 137 mmol/L (ref 135–145)

## 2019-04-16 LAB — BASIC METABOLIC PANEL
BUN: 18 (ref 4–21)
Creatinine: 1.2 (ref 0.6–1.3)
Glucose: 437
Sodium: 137 (ref 137–147)

## 2019-04-16 LAB — CEA (IN HOUSE-CHCC): CEA (CHCC-In House): 3.75 ng/mL (ref 0.00–5.00)

## 2019-04-16 MED ORDER — SODIUM CHLORIDE (PF) 0.9 % IJ SOLN
INTRAMUSCULAR | Status: AC
  Filled 2019-04-16: qty 50

## 2019-04-16 MED ORDER — IOHEXOL 300 MG/ML  SOLN
75.0000 mL | Freq: Once | INTRAMUSCULAR | Status: AC | PRN
  Administered 2019-04-16: 75 mL via INTRAVENOUS

## 2019-04-17 ENCOUNTER — Telehealth: Payer: Self-pay

## 2019-04-17 NOTE — Telephone Encounter (Signed)
Spoke with pt, advised per Dr Benay Spice CEA is normal and to follow-up as scheduled. Advised per Dr Benay Spice glucose is elevated and that pt needs to follow-up with his PCP for diabetes management. Advised pt a copy of labs will be forwarded to PCP. Pt verbalized understanding.

## 2019-04-17 NOTE — Telephone Encounter (Signed)
-----   Message from Ladell Pier, MD sent at 04/16/2019  4:44 PM EDT ----- Please call patient, CEA is normal, follow-up as scheduled Glucose is markedly elevated, copy to primary MD, he needs to follow-up with his primary physician for diabetes management

## 2019-04-18 ENCOUNTER — Telehealth: Payer: Self-pay | Admitting: *Deleted

## 2019-04-18 NOTE — Telephone Encounter (Signed)
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 Benay Spice. Faxed copy to his PCP, Dr. Hetty Blend to follow up.

## 2019-04-20 ENCOUNTER — Other Ambulatory Visit: Payer: Self-pay | Admitting: Family Medicine

## 2019-04-23 ENCOUNTER — Encounter: Payer: Self-pay | Admitting: Family Medicine

## 2019-04-30 ENCOUNTER — Other Ambulatory Visit: Payer: Self-pay | Admitting: *Deleted

## 2019-04-30 DIAGNOSIS — M1A9XX0 Chronic gout, unspecified, without tophus (tophi): Secondary | ICD-10-CM

## 2019-04-30 MED ORDER — ALLOPURINOL 300 MG PO TABS: 300.0000 mg | ORAL_TABLET | Freq: Every day | ORAL | 1 refills | Status: DC

## 2019-06-11 ENCOUNTER — Other Ambulatory Visit (HOSPITAL_COMMUNITY)
Admission: RE | Admit: 2019-06-11 | Discharge: 2019-06-11 | Disposition: A | Discharge status: Home / Self Care | Payer: Federal, State, Local not specified - PPO | Source: Ambulatory Visit | Attending: General Surgery | Admitting: General Surgery

## 2019-06-11 DIAGNOSIS — Z20828 Contact with and (suspected) exposure to other viral communicable diseases: Secondary | ICD-10-CM | POA: Diagnosis not present

## 2019-06-11 DIAGNOSIS — Z01812 Encounter for preprocedural laboratory examination: Secondary | ICD-10-CM | POA: Diagnosis not present

## 2019-06-12 LAB — NOVEL CORONAVIRUS, NAA (HOSP ORDER, SEND-OUT TO REF LAB; TAT 18-24 HRS): SARS-CoV-2, NAA: NOT DETECTED

## 2019-06-12 NOTE — Progress Notes (Signed)
Attempted to obtain medical history via telephone, unable to reach at this time. I left a voicemail to return pre surgical testing department's phone call.  

## 2019-06-13 NOTE — Progress Notes (Signed)
Attempted to obtain medical history via telephone, unable to reach at this time. I left a voicemail to return pre surgical testing department's phone call.  

## 2019-06-13 NOTE — Progress Notes (Signed)
Attempted pre call for procedure 9/11, no answer, generic voicemail so no message left.

## 2019-06-14 ENCOUNTER — Encounter (HOSPITAL_COMMUNITY): Payer: Self-pay | Admitting: *Deleted

## 2019-06-14 ENCOUNTER — Encounter (HOSPITAL_COMMUNITY)
Admission: RE | Disposition: A | Discharge status: Home / Self Care | Payer: Self-pay | Source: Home / Self Care | Attending: General Surgery

## 2019-06-14 ENCOUNTER — Ambulatory Visit (HOSPITAL_COMMUNITY): Payer: Federal, State, Local not specified - PPO | Admitting: Anesthesiology

## 2019-06-14 ENCOUNTER — Ambulatory Visit (HOSPITAL_COMMUNITY)
Admission: RE | Admit: 2019-06-14 | Discharge: 2019-06-14 | Disposition: A | Discharge status: Home / Self Care | Payer: Federal, State, Local not specified - PPO | Attending: General Surgery | Admitting: General Surgery

## 2019-06-14 DIAGNOSIS — Z85038 Personal history of other malignant neoplasm of large intestine: Secondary | ICD-10-CM | POA: Insufficient documentation

## 2019-06-14 DIAGNOSIS — Z79899 Other long term (current) drug therapy: Secondary | ICD-10-CM | POA: Insufficient documentation

## 2019-06-14 DIAGNOSIS — Z08 Encounter for follow-up examination after completed treatment for malignant neoplasm: Secondary | ICD-10-CM | POA: Diagnosis not present

## 2019-06-14 DIAGNOSIS — M109 Gout, unspecified: Secondary | ICD-10-CM | POA: Diagnosis not present

## 2019-06-14 DIAGNOSIS — Z1211 Encounter for screening for malignant neoplasm of colon: Secondary | ICD-10-CM | POA: Diagnosis not present

## 2019-06-14 DIAGNOSIS — G629 Polyneuropathy, unspecified: Secondary | ICD-10-CM | POA: Diagnosis not present

## 2019-06-14 HISTORY — PX: COLONOSCOPY: SHX5424

## 2019-06-14 SURGERY — COLONOSCOPY
Anesthesia: Monitor Anesthesia Care

## 2019-06-14 MED ORDER — PROPOFOL 10 MG/ML IV BOLUS
INTRAVENOUS | Status: AC
  Filled 2019-06-14: qty 40

## 2019-06-14 MED ORDER — PROPOFOL 10 MG/ML IV BOLUS
INTRAVENOUS | Status: AC
  Filled 2019-06-14: qty 20

## 2019-06-14 MED ORDER — SODIUM CHLORIDE 0.9 % IV SOLN: INTRAVENOUS | Status: DC

## 2019-06-14 MED ORDER — LACTATED RINGERS IV SOLN
INTRAVENOUS | Status: DC
  Administered 2019-06-14: 1000 mL via INTRAVENOUS

## 2019-06-14 MED ORDER — PROPOFOL 500 MG/50ML IV EMUL
INTRAVENOUS | Status: DC | PRN
  Administered 2019-06-14: 120 ug/kg/min via INTRAVENOUS

## 2019-06-14 MED ORDER — PROPOFOL 10 MG/ML IV BOLUS
INTRAVENOUS | Status: DC | PRN
  Administered 2019-06-14: 10 mg via INTRAVENOUS
  Administered 2019-06-14 (×5): 20 mg via INTRAVENOUS
  Administered 2019-06-14: 50 mg via INTRAVENOUS

## 2019-06-14 NOTE — Transfer of Care (Signed)
Immediate Anesthesia Transfer of Care Note  Patient: John Hubbard  Procedure(s) Performed: Procedure(s): COLONOSCOPY (N/A)  Patient Location: PACU  Anesthesia Type:MAC  Level of Consciousness:  sedated, patient cooperative and responds to stimulation  Airway & Oxygen Therapy:Patient Spontanous Breathing and Patient connected to face mask oxgen  Post-op Assessment:  Report given to PACU RN and Post -op Vital signs reviewed and stable  Post vital signs:  Reviewed and stable  Last Vitals:  Vitals:   06/14/19 0945  BP: (!) 167/95  Pulse: 72  Resp: 16  Temp: 36.8 C  SpO2: 01%    Complications: No apparent anesthesia complications

## 2019-06-14 NOTE — Discharge Instructions (Signed)
Post Colonoscopy Instructions ° °1. DIET: Follow a light bland diet the first 24 hours after arrival home, such as soup, liquids, crackers, etc.  Be sure to include lots of fluids daily.  Avoid fast food or heavy meals as your are more likely to get nauseated.   °2. You may have some mild rectal bleeding for the first few days after the procedure.  This should get less and less with time.  Resume any blood thinners 2 days after your procedure unless directed otherwise by your physician. °3. Take your usually prescribed home medications unless otherwise directed. °a. If you have any pain, it is helpful to get up and walk around, as it is usually from excess gas. °b. If this is not helpful, you can take an over-the-counter pain medication.  Choose one of the following that works best for you: °i. Naproxen (Aleve, etc)  Two 220mg tabs twice a day °ii. Ibuprofen (Advil, etc) Three 200mg tabs four times a day (every meal & bedtime) °iii. If you still have pain after using one of these, please call the office °4. It is normal to not have a bowel movement for 2-3 days after colonoscopy.   ° °5. ACTIVITIES as tolerated:   °6. You may resume regular (light) daily activities beginning the next day--such as daily self-care, walking, climbing stairs--gradually increasing activities as tolerated.  ° ° °WHEN TO CALL US [phone]: °1. Fever over 101.5 F (38.5 C)  °2. Severe abdominal or chest pain  °3. Large amount of rectal bleeding, passing multiple blood clots  °4. Dizziness or shortness of breath °5. Increasing nausea or vomiting ° ° The clinic staff is available to answer your questions during regular business hours (8:30am-5pm).  Please don’t hesitate to call and ask to speak to one of our nurses for clinical concerns.  ° If you have a medical emergency, go to the nearest emergency room or call 911. ° A surgeon from [facility] is always on call at the hospitals ° ° °[facility] °[address], [address], [place],   [address] ? °MAIN: [phone] ? TOLL FREE: [phone] ?  °FAX [phone] °[email or web address] ° ° °

## 2019-06-14 NOTE — Op Note (Signed)
[facility] Patient Name: John Hubbard Search Procedure Date: 06/14/2019 MRN: [id number] Attending MD: [name] , MD Date of Birth: August 01, 1961 CSN: [id number] Age: 58 Admit Type: Inpatient Procedure:                Colonoscopy Indications:              High risk colon cancer surveillance: Personal                            history of colon cancer Providers:                [name], MD, Cleda Daub, RN, Marguerita Merles, Technician Referring MD:              Medicines:                Propofol per Anesthesia Complications:            No immediate complications. Estimated Blood Loss:     Estimated blood loss: none. Procedure:                Pre-Anesthesia Assessment:                           - Prior to the procedure, a History and Physical                            was performed, and patient medications and                            allergies were reviewed. The patient's tolerance of                            previous anesthesia was also reviewed. The risks                            and benefits of the procedure and the sedation                            options and risks were discussed with the patient.                            All questions were answered, and informed consent                            was obtained. Prior Anticoagulants: The patient has                            taken no previous anticoagulant or antiplatelet                            agents. ASA Grade Assessment: III - A patient with                            severe systemic  disease. After reviewing the risks                            and benefits, the patient was deemed in                            satisfactory condition to undergo the procedure.                           After obtaining informed consent, the colonoscope                            was passed under direct vision. Throughout the                            procedure, the patient's blood  pressure, pulse, and                            oxygen saturations were monitored continuously. The                            [id number] Olympus colonoscope was                            introduced through the anus and advanced to the the                            cecum, identified by appendiceal orifice and                            ileocecal valve. The colonoscopy was technically                            difficult and complex due to post-surgical anatomy,                            poor endoscopic visualization, a tortuous colon and                            the patient's body habitus. Successful completion                            of the procedure was aided by increasing the dose                            of sedation medication, using manual pressure,                            withdrawing and reinserting the scope and using                            scope torsion. The patient tolerated the procedure  well. The quality of the bowel preparation was                            adequate to identify polyps 6 mm and larger in size. Scope In: 10:37:13 AM Scope Out: 11:15:02 AM Scope Withdrawal Time: 0 hours 22 minutes 14 seconds  Total Procedure Duration: 0 hours 37 minutes 49 seconds  Findings:      The perianal and digital rectal examinations were normal. Pertinent       negatives include normal sphincter tone.      The colon (entire examined portion) appeared normal. Estimated blood       loss: none. Impression:               - The entire examined colon is normal.                           - No specimens collected. Moderate Sedation:      See the other procedure note for documentation of moderate sedation with       intraservice time. Recommendation:           - Discharge patient to home (ambulatory).                           - Resume previous diet.                           - Continue present medications.                           -  Repeat colonoscopy in 3 years for surveillance. Procedure Code(s):        --- Professional ---                           [id number], Colorectal cancer screening; colonoscopy on                            individual at high risk Diagnosis Code(s):        --- Professional ---                           [id number], Personal history of other malignant                            neoplasm of large intestine CPT copyright 2019 American Medical Association. All rights reserved. The codes documented in this report are preliminary and upon coder review may  be revised to meet current compliance requirements. [name], MD [name], MD [id number] 11:23:16 AM This report has been signed electronically. Number of Addenda: 0

## 2019-06-14 NOTE — Anesthesia Postprocedure Evaluation (Signed)
Anesthesia Post Note  Patient: John Hubbard  Procedure(s) Performed: COLONOSCOPY (N/A )     Patient location during evaluation: PACU Anesthesia Type: MAC Level of consciousness: awake and alert Pain management: pain level controlled Vital Signs Assessment: post-procedure vital signs reviewed and stable Respiratory status: spontaneous breathing, nonlabored ventilation, respiratory function stable and patient connected to nasal cannula oxygen Cardiovascular status: stable and blood pressure returned to baseline Postop Assessment: no apparent nausea or vomiting Anesthetic complications: no    Last Vitals:  Vitals:   06/14/19 1145 06/14/19 1150  BP:  (!) 150/85  Pulse: 73 72  Resp: 19 20  Temp:    SpO2: 94% 96%    Last Pain:  Vitals:   06/14/19 1150  TempSrc:   PainSc: 0-No pain                 Ryan P Ellender

## 2019-06-14 NOTE — H&P (Signed)
John Hubbard is an 58 y.o. male.   Chief Complaint: Surveillance, h/o colon cancer HPI: 58 y.o. M with h/o stage 3 colon cancer.  He underwent LAR in 2014 and adjuvant chemotherapy afterwards.  His 1 yr f/u scope was negative for any new adenomatous polyps.  He was due to repeat colonoscopy last year.   He has no new symptoms.   Past Medical History:  Diagnosis Date  . Anxiety   . Bipolar disorder ([place])   . Colon cancer ([facility])    hx colon cancer, surgery, chemotherapy -last 5'15  . Complication of anesthesia    woke-up during colonoscopy; difficult to wake  . Gout   . History of blood transfusion 2015  . Neuropathy     Past Surgical History:  Procedure Laterality Date  . COLONOSCOPY Bilateral 07/24/2013   Procedure: COLONOSCOPY;  Surgeon: [name], MD;  Location: [facility];  Service: Endoscopy;  Laterality: Bilateral;  . COLONOSCOPY WITH PROPOFOL N/A 10/23/2014   Procedure: COLONOSCOPY WITH PROPOFOL;  Surgeon: [name], MD;  Location: [facility];  Service: Endoscopy;  Laterality: N/A;  . EUS N/A 12/15/2016   Procedure: UPPER ENDOSCOPIC ULTRASOUND (EUS) LINEAR;  Surgeon: [name], MD;  Location: [facility];  Service: Endoscopy;  Laterality: N/A;  . HERNIA REPAIR  [id number]   Umbilical Hernia Repair  . LAPAROSCOPIC PARTIAL COLECTOMY N/A 08/14/2013   Procedure: LAPAROSCOPIC PARTIAL COLECTOMY wound vac placement;  Surgeon: [name], MD;  Location: [facility];  Service: General;  Laterality: N/A;  . MEDIASTINOSCOPY N/A 12/28/2016   Procedure: MEDIASTINOSCOPY;  Surgeon: [name], MD;  Location: [place];  Service: Thoracic;  Laterality: N/A;  . PORT-A-CATH REMOVAL N/A 05/14/2014   Procedure: REMOVAL PORT-A-CATH;  Surgeon: [name], MD;  Location: [facility];  Service: General;  Laterality: N/A;  . PORTACATH PLACEMENT Left 09/16/2013   Procedure: INSERTION PORT-A-CATH;  Surgeon: [name], MD;  Location: [place];  Service: General;   Laterality: Left;  dressing change on lower abdominal wound  . VIDEO BRONCHOSCOPY WITH ENDOBRONCHIAL ULTRASOUND N/A 12/28/2016   Procedure: VIDEO BRONCHOSCOPY WITH ENDOBRONCHIAL ULTRASOUND;  Surgeon: [name], MD;  Location: [facility];  Service: Thoracic;  Laterality: N/A;    Family History  Problem Relation Age of Onset  . Heart disease Father    Social History:  reports that he has never smoked. He has never used smokeless tobacco. He reports current alcohol use. He reports that he does not use drugs.  Allergies:  Allergies  Allergen Reactions  . Penicillins Hives and Other (See Comments)    Has patient had a PCN reaction causing immediate rash, facial/tongue/throat swelling, SOB or lightheadedness with hypotension: No Has patient had a PCN reaction causing SEVERE RASH INVOLVING MUCUS MEMBRANES or SKIN NECROSIS: #  #  #  YES  #  #  #  Has patient had a PCN reaction that required hospitalization #  #  #  YES  #  #  #  Has patient had a PCN reaction occurring within the last 10 years: No     Medications Prior to Admission  Medication Sig Dispense Refill  . allopurinol (ZYLOPRIM) 300 MG tablet Take 1 tablet (300 mg total) by mouth daily. 90 tablet 1  . carbamazepine (TEGRETOL) 200 MG tablet TAKE 2 TABLETS (400 MG TOTAL) BY MOUTH AT BEDTIME. 180 tablet 0  . cholecalciferol (VITAMIN D3) 25 MCG (1000 UT) tablet Take 1,000 Units by mouth daily.    . Coenzyme  Q10 (COQ10) 100 MG CAPS Take 100 mg by mouth daily.    . Multiple Vitamins-Minerals (MULTIVITAMIN WITH MINERALS) tablet Take 1 tablet by mouth daily.    . vitamin C (ASCORBIC ACID) 500 MG tablet Take 500 mg by mouth daily.    . colchicine 0.6 MG tablet Take 0.6 mg by mouth 2 (two) times daily as needed (Gout).       No results found for this or any previous visit (from the past 48 hour(s)). No results found.  Review of Systems  Constitutional: Negative for chills, fever and weight loss.  HENT: Negative for hearing loss and  tinnitus.   Eyes: Negative for blurred vision and double vision.  Respiratory: Negative for cough and shortness of breath.   Cardiovascular: Negative for chest pain and palpitations.  Gastrointestinal: Negative for abdominal pain, nausea and vomiting.  Genitourinary: Negative for dysuria, frequency and urgency.  Musculoskeletal: Negative for myalgias.  Skin: Negative for itching and rash.  Neurological: Negative for dizziness and headaches.    Blood pressure (!) 167/95, pulse 72, temperature 98.2 F (36.8 C), temperature source Oral, resp. rate 16, height 5\' 10"  (1.778 m), weight 130.2 kg, SpO2 98 %. Physical Exam  Constitutional: He is oriented to person, place, and time. He appears well-developed and well-nourished. No distress.  HENT:  Head: Normocephalic and atraumatic.  Eyes: Pupils are equal, round, and reactive to light. Conjunctivae and EOM are normal.  Neck: Normal range of motion. Neck supple.  Cardiovascular: Normal rate and regular rhythm.  Respiratory: Effort normal. No respiratory distress.  GI: Soft. He exhibits no distension. There is no abdominal tenderness.  Musculoskeletal: Normal range of motion.  Neurological: He is alert and oriented to person, place, and time.  Skin: Skin is warm and dry.     Assessment/Plan 58 y.o. M with h/o colon cancer.  He is s/p colonoscopy in 2016 and was due for repeat colonoscopy last year.  He is here today for surveillance.  Risks include bleeding, perforation, missed pathology and inability to complete procedure.  I believe he understands this and wishes to proceed.    Rosario Adie, MD [id number], 9:57 AM

## 2019-06-14 NOTE — Anesthesia Preprocedure Evaluation (Addendum)
Anesthesia Evaluation  Patient identified by MRN, date of birth, ID band Patient awake    Reviewed: Allergy & Precautions, NPO status , Patient's Chart, lab work & pertinent test results  Airway Mallampati: II  TM Distance: >3 FB Neck ROM: Full    Dental no notable dental hx.    Pulmonary neg pulmonary ROS,    Pulmonary exam normal breath sounds clear to auscultation       Cardiovascular negative cardio ROS Normal cardiovascular exam Rhythm:Regular Rate:Normal     Neuro/Psych PSYCHIATRIC DISORDERS Anxiety Bipolar Disorder negative neurological ROS     GI/Hepatic Neg liver ROS, Colon cancer    Endo/Other  negative endocrine ROS  Renal/GU negative Renal ROS     Musculoskeletal negative musculoskeletal ROS (+)   Abdominal (+) + obese,   Peds  Hematology negative hematology ROS (+) Gout   Anesthesia Other Findings surveillance, history of colon cancer  Reproductive/Obstetrics                            Anesthesia Physical Anesthesia Plan  ASA: II  Anesthesia Plan: MAC   Post-op Pain Management:    Induction: Intravenous  PONV Risk Score and Plan: 1 and Propofol infusion and Treatment may vary due to age or medical condition  Airway Management Planned: Simple Face Mask  Additional Equipment:   Intra-op Plan:   Post-operative Plan:   Informed Consent: I have reviewed the patients History and Physical, chart, labs and discussed the procedure including the risks, benefits and alternatives for the proposed anesthesia with the patient or authorized representative who has indicated his/her understanding and acceptance.     Dental advisory given  Plan Discussed with: CRNA  Anesthesia Plan Comments:        Anesthesia Quick Evaluation

## 2019-06-17 ENCOUNTER — Encounter (HOSPITAL_COMMUNITY): Payer: Self-pay | Admitting: General Surgery

## 2019-07-10 DIAGNOSIS — K080 Exfoliation of teeth due to systemic causes: Secondary | ICD-10-CM | POA: Diagnosis not present

## 2019-07-23 ENCOUNTER — Other Ambulatory Visit: Payer: Self-pay

## 2019-07-23 MED ORDER — CARBAMAZEPINE 200 MG PO TABS: 400.0000 mg | ORAL_TABLET | Freq: Every day | ORAL | 0 refills | Status: DC

## 2019-10-03 ENCOUNTER — Other Ambulatory Visit: Payer: Self-pay | Admitting: Family Medicine

## 2019-10-29 ENCOUNTER — Other Ambulatory Visit: Payer: Self-pay | Admitting: Family Medicine

## 2019-10-29 DIAGNOSIS — M1A9XX0 Chronic gout, unspecified, without tophus (tophi): Secondary | ICD-10-CM

## 2019-12-12 DIAGNOSIS — S83411A Sprain of medial collateral ligament of right knee, initial encounter: Secondary | ICD-10-CM | POA: Diagnosis not present

## 2019-12-12 DIAGNOSIS — M25511 Pain in right shoulder: Secondary | ICD-10-CM | POA: Diagnosis not present

## 2020-02-21 ENCOUNTER — Other Ambulatory Visit: Payer: Self-pay

## 2020-02-21 ENCOUNTER — Inpatient Hospital Stay: Payer: Federal, State, Local not specified - PPO | Attending: Oncology | Admitting: Oncology

## 2020-02-21 VITALS — BP 138/83 | HR 97 | Temp 97.2°F | Resp 17 | Ht 70.0 in | Wt 256.5 lb

## 2020-02-21 DIAGNOSIS — R911 Solitary pulmonary nodule: Secondary | ICD-10-CM | POA: Insufficient documentation

## 2020-02-21 DIAGNOSIS — C189 Malignant neoplasm of colon, unspecified: Secondary | ICD-10-CM

## 2020-02-21 DIAGNOSIS — Z85038 Personal history of other malignant neoplasm of large intestine: Secondary | ICD-10-CM | POA: Diagnosis not present

## 2020-02-21 NOTE — Progress Notes (Signed)
[facility] OFFICE PROGRESS NOTE   Diagnosis: Colon cancer  INTERVAL HISTORY:   Mr. Barco returns as scheduled.  He feels well.  He reports intentional weight loss with exercise and a change in his diet.  No difficulty with bowel function.  He underwent a negative colonoscopy by Dr. Marcello Moores on 06/14/2019.  Objective:  Vital signs in last 24 hours:  Blood pressure 138/83, pulse 97, temperature (!) 97.2 F (36.2 C), temperature source Temporal, resp. rate 17, height '5\' 10"'  (1.778 m), weight 256 lb 8 oz (116.3 kg), SpO2 96 %.    Lymphatics: No cervical, supraclavicular, axillary, or inguinal nodes Resp: Lungs clear bilaterally Cardio: Regular rate and rhythm GI: No hepatosplenomegaly, no mass, nontender Vascular: No leg edema     Lab Results:  Lab Results  Component Value Date   WBC 7.0 04/13/2018   HGB 15.1 04/13/2018   HCT 44.5 04/13/2018   MCV 82.3 04/13/2018   PLT 203 04/13/2018   NEUTROABS 3.9 10/28/2016    CMP  Lab Results  Component Value Date   NA 137 04/16/2019   K 4.4 04/16/2019   CL 97 (L) 04/16/2019   CO2 19 (L) 04/16/2019   GLUCOSE 437 (H) 04/16/2019   BUN 18 04/16/2019   CREATININE 1.17 04/16/2019   CALCIUM 9.8 04/16/2019   PROT 7.3 04/13/2018   ALBUMIN 3.8 12/28/2016   AST 20 04/13/2018   ALT 30 04/13/2018   ALKPHOS 128 (H) 12/28/2016   BILITOT 0.4 04/13/2018   GFRNONAA >60 04/16/2019   GFRAA >60 04/16/2019    Lab Results  Component Value Date   CEA1 3.75 04/16/2019    Medications: I have reviewed the patient's current medications.   Assessment/Plan: 1. 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 The tumor returned microsatellite stable with no loss of mismatch repair protein expression.  Cycle 1 adjuvant CAPOX beginning 09/18/2013.   Cycle 7 CAPOX 01/22/2014   Cycle 8 CAPOX 02/12/2014 (oxaliplatin deleted).  Surveillance colonoscopy  January 2016  CT scans chest, abdomen and pelvis 08/15/2014 with no evidence of metastatic disease.  CT scans 11/15/2016-interval development of mediastinal adenopathy. Enlarging nodule within right upper lobe. No mass or adenopathy identified within the abdomen or pelvis.  PET scan 12/05/2016-FDG avid seen in the mediastinum and hila. Nodule in the medial right upper lobe increased since 2015, no abnormal FDG uptake in the region of the nodule. Epicardial node inferior to the liver dome FDG avid. Porta hepatis and portacaval nodes FDG avid as well. Also FDG avid node anterior to the right side of the crus.  EUS biopsy of a portacaval node on 12/15/2016-nondiagnostic  Bronchoscopy/mediastinoscopy 12/28/2016-biopsies of level 7 and leve l4R nodes negative for malignancy. Changes consistent with a fibrotic lymph node-potentially old granulomas identified on the level 7 node  CT chest 04/16/2019-resolution of mediastinal adenopathy, decrease in size of right upper lobe subpleural nodule, morphologic features of cirrhosis  Colonoscopy 06/14/2019-negative 2. Gout. He continues allopurinol. 3. History of anxiety maintained on Tegretol. 4. Hypertension. 5. History of delayed nausea. Improved with Aloxi/Emend 6. Rash-the rash occurred following cycle 7 CAPOX. It is possible the rash was related to capecitabine. Improved.  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    Disposition: Mr. Haig is in clinical remission from colon cancer.  He would like to continue follow-up at the Cancer center.  He will  return for an office visit in 1 year.  He will continue follow-up with Dr. Marcello Moores for surveillance colonoscopy.  I encouraged him to obtain the COVID-19 vaccine.  He plans to continue follow-up with Dr. Jerline Pain for internal medicine care.  I explained the finding of cirrhosis on the 04/16/2019 chest CT  and sent a note to Dr. Jerline Pain.  Betsy Coder, MD  02/21/2020  12:29 PM

## 2020-02-24 ENCOUNTER — Telehealth: Payer: Self-pay | Admitting: Oncology

## 2020-02-24 NOTE — Telephone Encounter (Signed)
Scheduled per los. Called and left msg. Mailed printout  °

## 2020-05-06 ENCOUNTER — Other Ambulatory Visit: Payer: Self-pay | Admitting: Family Medicine

## 2020-05-06 DIAGNOSIS — M1A9XX0 Chronic gout, unspecified, without tophus (tophi): Secondary | ICD-10-CM

## 2020-09-10 ENCOUNTER — Ambulatory Visit: Payer: Federal, State, Local not specified - PPO | Admitting: Family Medicine

## 2020-09-17 ENCOUNTER — Ambulatory Visit: Payer: Federal, State, Local not specified - PPO | Admitting: Family Medicine

## 2020-09-17 ENCOUNTER — Other Ambulatory Visit: Payer: Self-pay

## 2020-09-17 VITALS — BP 146/76 | HR 85 | Temp 97.8°F | Ht 70.0 in | Wt 252.2 lb

## 2020-09-17 DIAGNOSIS — Z13220 Encounter for screening for lipoid disorders: Secondary | ICD-10-CM | POA: Diagnosis not present

## 2020-09-17 DIAGNOSIS — R079 Chest pain, unspecified: Secondary | ICD-10-CM | POA: Diagnosis not present

## 2020-09-17 DIAGNOSIS — R739 Hyperglycemia, unspecified: Secondary | ICD-10-CM | POA: Diagnosis not present

## 2020-09-17 DIAGNOSIS — F419 Anxiety disorder, unspecified: Secondary | ICD-10-CM

## 2020-09-17 DIAGNOSIS — M1A9XX0 Chronic gout, unspecified, without tophus (tophi): Secondary | ICD-10-CM | POA: Diagnosis not present

## 2020-09-17 DIAGNOSIS — F319 Bipolar disorder, unspecified: Secondary | ICD-10-CM | POA: Diagnosis not present

## 2020-09-17 MED ORDER — ALLOPURINOL 300 MG PO TABS: 300.0000 mg | ORAL_TABLET | Freq: Every day | ORAL | 1 refills | Status: DC

## 2020-09-17 MED ORDER — CARBAMAZEPINE 200 MG PO TABS: 400.0000 mg | ORAL_TABLET | Freq: Every day | ORAL | 3 refills | Status: DC

## 2020-09-17 NOTE — Assessment & Plan Note (Signed)
Check A1c. 

## 2020-09-17 NOTE — Progress Notes (Signed)
   John Hubbard is a 59 y.o. male who presents today for an office visit.  Assessment/Plan:  New/Acute Problems: Chest Pain History is atypical though has several risk factors for cardiac disease. No current pain or symptoms.  Will place referral to cardiology for possible stress testing.  Discussed reasons to seek emergent care.  If cardiac work up negative will look for other causes such as GI or MSK.   Chronic Problems Addressed Today: Hyperglycemia Check A1c.   Anxiety Stable.  Continue Tegretol 400 mg daily.  Refill sent in today.  Bipolar disorder ([place]) Stable.  Continue Tegretol 400 mg daily.  Chronic gout involving toe without tophus Stable.  Continue allopurinol 300 mg daily.  Refill sent in today.  Preventative Healthcare Check lipids in addition to above labs.      Subjective:  HPI:  Patient here for follow up. He has not been seen for over 2 years.   See A/P for status of chronic conditions.    No significant updates since our last visit though he does report having more chest tightness intermittently for the last several weeks to months.  Symptoms occur randomly.  No chest pain.  No shortness of breath.  Symptoms are nonexertional.  He has been taking a baby aspirin which seems to help.  Tightness resolves spontaneously.        Objective:  Physical Exam: BP (!) 146/76   Pulse 85   Temp 97.8 F (36.6 C) (Temporal)   Ht 5\' 10"  (1.778 m)   Wt 252 lb 3.2 oz (114.4 kg)   SpO2 97%   BMI 36.19 kg/m   Gen: No acute distress, resting comfortably CV: Regular rate and rhythm with no murmurs appreciated Pulm: Normal work of breathing, clear to auscultation bilaterally with no crackles, wheezes, or rhonchi MSK: Chest Pain not reproducible on exam.  Neuro: Grossly normal, moves all extremities Psych: Normal affect and thought content  EKG: NSR.  Left axis deviation.  No signs of acute ischemia  Time Spent: 45 minutes of total time was spent on the date of the  encounter performing the following actions: chart review prior to seeing the patient, obtaining history, performing a medically necessary exam, counseling on the treatment plan including cardiology referral, placing orders, and documenting in our EHR.        [name]. Jerline Pain, MD 09/17/2020 3:54 PM

## 2020-09-17 NOTE — Patient Instructions (Signed)
It was very nice to see you today!  We will check blood work today.  I will refill your medications.  I would like for you to see a cardiologist to make sure that you are not having any issues with your heart causing your chest symptoms.  I would like to see you back in 1 year for your next annual checkup.  Please come back to see me sooner if needed.  Take care, Dr Jerline Pain  Please try these tips to maintain a healthy lifestyle:   Eat at least 3 REAL meals and 1-2 snacks per day.  Aim for no more than 5 hours between eating.  If you eat breakfast, please do so within one hour of getting up.    Each meal should contain half fruits/vegetables, one quarter protein, and one quarter carbs (no bigger than a computer mouse)   Cut down on sweet beverages. This includes juice, soda, and sweet tea.     Drink at least 1 glass of water with each meal and aim for at least 8 glasses per day   Exercise at least 150 minutes every week.

## 2020-09-17 NOTE — Assessment & Plan Note (Signed)
Stable.  Continue Tegretol 400 mg daily.  Refill sent in today.

## 2020-09-17 NOTE — Assessment & Plan Note (Signed)
Stable.  Continue Tegretol 400 mg daily.

## 2020-09-17 NOTE — Assessment & Plan Note (Signed)
Stable.  Continue allopurinol 300 mg daily.  Refill sent in today.

## 2020-09-18 LAB — CBC
HCT: 45.7 % (ref 38.5–50.0)
Hemoglobin: 15.7 g/dL (ref 13.2–17.1)
MCH: 27.8 pg (ref 27.0–33.0)
MCHC: 34.4 g/dL (ref 32.0–36.0)
MCV: 81 fL (ref 80.0–100.0)
MPV: 11.2 fL (ref 7.5–12.5)
Platelets: 210 10*3/uL (ref 140–400)
RBC: 5.64 10*6/uL (ref 4.20–5.80)
RDW: 13 % (ref 11.0–15.0)
WBC: 5.9 10*3/uL (ref 3.8–10.8)

## 2020-09-18 LAB — COMPREHENSIVE METABOLIC PANEL
AG Ratio: 1.7 (calc) (ref 1.0–2.5)
ALT: 13 U/L (ref 9–46)
AST: 12 U/L (ref 10–35)
Albumin: 4.2 g/dL (ref 3.6–5.1)
Alkaline phosphatase (APISO): 115 U/L (ref 35–144)
BUN: 17 mg/dL (ref 7–25)
CO2: 27 mmol/L (ref 20–32)
Calcium: 9.5 mg/dL (ref 8.6–10.3)
Chloride: 95 mmol/L — ABNORMAL LOW (ref 98–110)
Creat: 0.85 mg/dL (ref 0.70–1.25)
Globulin: 2.5 g/dL (calc) (ref 1.9–3.7)
Glucose, Bld: 371 mg/dL — ABNORMAL HIGH (ref 65–99)
Potassium: 4.3 mmol/L (ref 3.5–5.3)
Sodium: 131 mmol/L — ABNORMAL LOW (ref 135–146)
Total Bilirubin: 0.4 mg/dL (ref 0.2–1.2)
Total Protein: 6.7 g/dL (ref 6.1–8.1)

## 2020-09-18 LAB — LIPID PANEL
Cholesterol: 553 mg/dL — ABNORMAL HIGH (ref <200)
HDL: 29 mg/dL — ABNORMAL LOW (ref >=40)
Non-HDL Cholesterol (Calc): 524 mg/dL (calc) — ABNORMAL HIGH (ref <130)
Total CHOL/HDL Ratio: 19.1 (calc) — ABNORMAL HIGH (ref <5.0)
Triglycerides: 3180 mg/dL — ABNORMAL HIGH (ref <150)

## 2020-09-18 LAB — HEMOGLOBIN A1C
Hgb A1c MFr Bld: 14 % of total Hgb — ABNORMAL HIGH (ref <5.7)
Mean Plasma Glucose: 355 mg/dL
eAG (mmol/L): 19.7 mmol/L

## 2020-09-18 LAB — TSH: TSH: 1.13 mIU/L (ref 0.40–4.50)

## 2020-09-18 NOTE — Progress Notes (Signed)
Please inform patient of the following:  His cholesterol and blood sugar are both extremely elevated. Recommend starting atorvastatin 40mg  daily and metformin 500mg  bid. Please send in both of these. I would like to see him back in 3 months to recheck. IT is really important that we bring these numbers down to lower his risk of heart attack and stroke.   John Hubbard. Jerline Pain, MD 09/18/2020 8:07 AM

## 2020-10-09 ENCOUNTER — Other Ambulatory Visit: Payer: Federal, State, Local not specified - PPO

## 2020-10-09 ENCOUNTER — Other Ambulatory Visit: Payer: Self-pay

## 2020-10-09 DIAGNOSIS — Z20822 Contact with and (suspected) exposure to covid-19: Secondary | ICD-10-CM

## 2020-10-13 LAB — NOVEL CORONAVIRUS, NAA: SARS-CoV-2, NAA: DETECTED — AB

## 2020-10-14 ENCOUNTER — Telehealth: Payer: Self-pay | Admitting: Adult Health

## 2020-10-14 NOTE — Telephone Encounter (Signed)
Called to discuss with patient about COVID-19 symptoms and the use of one of the available treatments for those with mild to moderate Covid symptoms and at a high risk of hospitalization.  Pt appears to qualify for outpatient treatment due to co-morbid conditions and/or a member of an at-risk group in accordance with the FDA Emergency Use Authorization.   Unable to reach pt - Left message to call back hotline.   Arjen Deringer

## 2020-10-16 ENCOUNTER — Ambulatory Visit: Payer: Federal, State, Local not specified - PPO | Admitting: Interventional Cardiology

## 2020-11-02 NOTE — Progress Notes (Addendum)
Cardiology Office Note:    Date:  11/04/2020   ID:  John Hubbard, DOB [birth year], MRN [id number]  PCP:  John Barrack, MD  Cardiologist:  No primary care provider on file.   Referring MD: John Barrack, MD   Chief Complaint  Patient presents with  . Coronary Artery Disease  . Chest Pain  . Hyperlipidemia    History of Present Illness:    John Hubbard is a 60 y.o. male with a hx of chest pain referred for consultation.  Familial mixed severe hyperlipidemia/hypertriglyceridemia,  Bipolar disorder, h/o resected colon cancer.  Mild exertional chest pressure.  Noted significantly while visiting [place] 2 months ago and while doing a lot of walking.  For greater than a year he has had rare episodes of discomfort.  These are exertion only.  He has only had 1-2 episodes of chest discomfort since [place] 2 months ago.  His job is sedentary.  He has had no episodes of rest.  His hemoglobin A1c in December was greater than 14.  Total cholesterol was greater than 500 and triglyceride greater than 3000 suggesting a mixed probably genetic hyperlipidemic state.  We will repeat his labs today to be sure that there was no error.  He says he has never heard about labs this abnormal in the past.  His EKG appears to show prior inferior infarction.  Past Medical History:  Diagnosis Date  . Anxiety   . Bipolar disorder ([place])   . Colon cancer (H[facility])    hx colon cancer, surgery, chemotherapy -last 5'15  . Complication of anesthesia    woke-up during colonoscopy; difficult to wake  . Gout   . History of blood transfusion 2015  . Neuropathy     Past Surgical History:  Procedure Laterality Date  . COLONOSCOPY Bilateral 07/24/2013   Procedure: COLONOSCOPY;  Surgeon: John Ruff, MD;  Location: [facility];  Service: Endoscopy;  Laterality: Bilateral;  . COLONOSCOPY N/A 06/14/2019   Procedure: COLONOSCOPY;  Surgeon: John Ruff, MD;  Location: [facility];  Service: Endoscopy;   Laterality: N/A;  . COLONOSCOPY WITH PROPOFOL N/A 10/23/2014   Procedure: COLONOSCOPY WITH PROPOFOL;  Surgeon: John Ruff, MD;  Location: [facility];  Service: Endoscopy;  Laterality: N/A;  . EUS N/A 12/15/2016   Procedure: UPPER ENDOSCOPIC ULTRASOUND (EUS) LINEAR;  Surgeon: John Banister, MD;  Location: [facility];  Service: Endoscopy;  Laterality: N/A;  . HERNIA REPAIR  [id number]   Umbilical Hernia Repair  . LAPAROSCOPIC PARTIAL COLECTOMY N/A 08/14/2013   Procedure: LAPAROSCOPIC PARTIAL COLECTOMY wound vac placement;  Surgeon: John Ruff, MD;  Location: [facility];  Service: General;  Laterality: N/A;  . MEDIASTINOSCOPY N/A 12/28/2016   Procedure: MEDIASTINOSCOPY;  Surgeon: John Isaac, MD;  Location: [facility];  Service: Thoracic;  Laterality: N/A;  . PORT-A-CATH REMOVAL N/A 05/14/2014   Procedure: REMOVAL PORT-A-CATH;  Surgeon: John Ruff, MD;  Location: [facility];  Service: General;  Laterality: N/A;  . PORTACATH PLACEMENT Left 09/16/2013   Procedure: INSERTION PORT-A-CATH;  Surgeon: John Ruff, MD;  Location: [facility];  Service: General;  Laterality: Left;  dressing change on lower abdominal wound  . VIDEO BRONCHOSCOPY WITH ENDOBRONCHIAL ULTRASOUND N/A 12/28/2016   Procedure: VIDEO BRONCHOSCOPY WITH ENDOBRONCHIAL ULTRASOUND;  Surgeon: John Isaac, MD;  Location: [facility];  Service: Thoracic;  Laterality: N/A;    Current Medications: Current Meds  Medication Sig  . allopurinol (ZYLOPRIM) 300 MG tablet Take 1 tablet (300 mg total) by mouth  daily.  . aspirin EC 81 MG tablet Take 1 tablet (81 mg total) by mouth daily. Swallow whole.  . carbamazepine (TEGRETOL) 200 MG tablet Take 2 tablets (400 mg total) by mouth at bedtime. PATIENT NEEDS APPOINTMENT  . cholecalciferol (VITAMIN D3) 25 MCG (1000 UT) tablet Take 1,000 Units by mouth daily.  . Coenzyme Q10 (COQ10) 100 MG CAPS Take 100 mg by mouth daily.  . metFORMIN (GLUCOPHAGE) 500 MG tablet Take 1 tablet (500 mg  total) by mouth 2 (two) times daily with a meal.  . Multiple Vitamins-Minerals (MULTIVITAMIN WITH MINERALS) tablet Take 1 tablet by mouth daily.  . nitroGLYCERIN (NITROSTAT) 0.4 MG SL tablet Place 1 tablet (0.4 mg total) under the tongue every 5 (five) minutes as needed for chest pain.  . rosuvastatin (CRESTOR) 40 MG tablet Take 1 tablet (40 mg total) by mouth daily.  . vitamin C (ASCORBIC ACID) 500 MG tablet Take 500 mg by mouth daily.     Allergies:   Penicillins   Social History   Socioeconomic History  . Marital status: Married    Spouse name: Not on file  . Number of children: Not on file  . Years of education: Not on file  . Highest education level: Not on file  Occupational History  . Not on file  Tobacco Use  . Smoking status: Never Smoker  . Smokeless tobacco: Never Used  Substance and Sexual Activity  . Alcohol use: Yes    Comment: rare  . Drug use: No  . Sexual activity: Not on file  Other Topics Concern  . Not on file  Social History Narrative  . Not on file   Social Determinants of Health   Financial Resource Strain: Not on file  Food Insecurity: Not on file  Transportation Needs: Not on file  Physical Activity: Not on file  Stress: Not on file  Social Connections: Not on file     Family History: The patient's family history includes Heart disease in his father.  ROS:   Please see the history of present illness.    He got Covid at a wedding.  6 of the 8 bridesmaid sore Covid positive.  After the wedding all of his family became ill with Covid.  Obese, sedentary, had Covid 2 to 3 weeks ago.  It made him feel very tired.  Says that if Dr. Dimas Hubbard tried to get into contact with him he never followed up to get his full instructions from the physician.  The patient feels this was his fault.  All other systems reviewed and are negative.  EKGs/Labs/Other Studies Reviewed:    The following studies were reviewed today: No cardiac evaluation  EKG:  EKG  09/17/2020 with LAD, probable old IMI, IRBBB. And PRWP.  Recent Labs: 09/17/2020: ALT 13; BUN 17; Creat 0.85; Hemoglobin 15.7; Platelets 210; Potassium 4.3; Sodium 131; TSH 1.13  Recent Lipid Panel    Component Value Date/Time   CHOL 553 (H) 09/17/2020 1543   TRIG 3,180 (H) 09/17/2020 1543   HDL 29 (L) 09/17/2020 1543   CHOLHDL 19.1 (H) 09/17/2020 1543   LDLCALC  09/17/2020 1543     Comment:     . LDL cholesterol not calculated. Triglyceride levels greater than 400 mg/dL invalidate calculated LDL results. . Reference range: <100 . Desirable range <100 mg/dL for primary prevention;   <70 mg/dL for patients with CHD or diabetic patients  with > or = 2 CHD risk factors. Marland Kitchen LDL-C is now calculated using the  Martin-Hopkins  calculation, which is a validated novel method providing  better accuracy than the Friedewald equation in the  estimation of LDL-C.  Cresenciano Genre et al. Annamaria Helling. 7564;332(95): 2061-2068  (http://education.[employer].com/faq/FAQ164)     Physical Exam:    VS:  BP (!) 142/82   Pulse 83   Ht 5\' 9"  (1.753 m)   Wt 249 lb 12.8 oz (113.3 kg)   SpO2 96%   BMI 36.89 kg/m     Wt Readings from Last 3 Encounters:  11/04/20 249 lb 12.8 oz (113.3 kg)  09/17/20 252 lb 3.2 oz (114.4 kg)  02/21/20 256 lb 8 oz (116.3 kg)     GEN: Morbidly obese. No acute distress HEENT: Normal NECK: No JVD. LYMPHATICS: No lymphadenopathy CARDIAC: No murmur. RRR no gallop, or edema. VASCULAR:  Normal Pulses. No bruits. RESPIRATORY:  Clear to auscultation without rales, wheezing or rhonchi  ABDOMEN: Soft, non-tender, non-distended, No pulsatile mass, MUSCULOSKELETAL: No deformity  SKIN: Warm and dry NEUROLOGIC:  Alert and oriented x 3 PSYCHIATRIC:  Normal affect   ASSESSMENT:    1. Angina pectoris ([place])   2. Nonspecific abnormal electrocardiogram (ECG) (EKG)   3. Type 2 diabetes mellitus with complication, without long-term current use of insulin ([place])   4. Bipolar affective  disorder, remission status unspecified ([place])   5. Malignant neoplasm of colon, unspecified part of colon ([place])   6. Hypertriglyceridemia   7. Educated about COVID-19 virus infection   8. Chest pain, unspecified type    PLAN:    In order of problems listed above:  1. Given the multiple risk factors that include severe hypercholesterolemia/hyperlipidemia, uncontrolled diabetes, obesity, and abnormal EKG, suspect he has significant underlying coronary disease.  We will identify severity with a stress nuclear study.  Sublingual nitroglycerin is given and explained.  Aspirin 81 mg/day as started.  Crestor 40 mg/day is started.  A lipid panel will be obtained today. 2. 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 Start Metformin 500 mg twice daily.  Discussed low carbohydrate diet.  Will need to increase physical activity after cardiac evaluation to exclude high risk subset. 4. Did not discuss his bipolar affective disorder. 5. Did not discuss his prior colon cancer. 6. Probably has mixed hyperlipidemia, genetic.  Rosuvastatin started at 40 mg/day.  A lipid panel is repeated today to be sure that there was not some narrowing which I doubt.  He will also need therapy for hypertriglyceridemia, perhaps Vascepa plus a fibrate. 7. He is status post COVID-19 infection.  He has never been vaccinated.   Return to see me in 1 month after completion of the stress myocardial perfusion study.  I will likely refer him to the advanced lipid clinic with Dr. Debara Pickett at that point.  Depending upon myocardial perfusion imaging, additional therapy to prevent vascular events will be initiated including possibly an ARB or ACE, beta-blocker, and possibly long-acting nitrate.  Medication Adjustments/Labs and Tests Ordered: Current medicines are reviewed at length with the patient today.  Concerns regarding medicines are outlined above.  Orders Placed This Encounter  Procedures  . Lipid panel  . MYOCARDIAL  PERFUSION IMAGING   Meds ordered this encounter  Medications  . aspirin EC 81 MG tablet    Sig: Take 1 tablet (81 mg total) by mouth daily. Swallow whole.    Dispense:  90 tablet    Refill:  3  . nitroGLYCERIN (NITROSTAT) 0.4 MG SL tablet    Sig: Place 1 tablet (0.4 mg total) under the  tongue every 5 (five) minutes as needed for chest pain.    Dispense:  25 tablet    Refill:  3  . rosuvastatin (CRESTOR) 40 MG tablet    Sig: Take 1 tablet (40 mg total) by mouth daily.    Dispense:  90 tablet    Refill:  3  . metFORMIN (GLUCOPHAGE) 500 MG tablet    Sig: Take 1 tablet (500 mg total) by mouth 2 (two) times daily with a meal.    Dispense:  180 tablet    Refill:  0    Further refills need to come from PCP    Patient Instructions  Medication Instructions:  1) START Aspirin 81mg  once daily 2)  A prescription has been sent in for Nitroglycerin.  If you have chest pain that doesn't relieve quickly, place one tablet under your tongue and allow it to dissolve.  If no relief after 5 minutes, you may take another pill.  If no relief after 5 minutes, you may take a 3rd dose but you need to call 911 and report to ER immediately. 3) START Metformin 500mg  twice daily 4) START Rosuvastatin 40mg  once daily  *If you need a refill on your cardiac medications before your next appointment, please call your pharmacy*   Lab Work: Lipid panel today  If you have labs (blood work) drawn today and your tests are completely normal, you will receive your results only by: Marland Kitchen MyChart Message (if you have MyChart) OR . A paper copy in the mail If you have any lab test that is abnormal or we need to change your treatment, we will call you to review the results.   Testing/Procedures: Your physician has requested that you have en exercise stress myoview. For further information please visit [email or web address]. Please follow instruction sheet, as given.   Follow-Up: At [facility], you and your health  needs are our priority.  As part of our continuing mission to provide you with exceptional heart care, we have created designated Provider Care Teams.  These Care Teams include your primary Cardiologist (physician) and Advanced Practice Providers (APPs -  Physician Assistants and Nurse Practitioners) who all work together to provide you with the care you need, when you need it.  We recommend signing up for the patient portal called "MyChart".  Sign up information is provided on this After Visit Summary.  MyChart is used to connect with patients for Virtual Visits (Telemedicine).  Patients are able to view lab/test results, encounter notes, upcoming appointments, etc.  Non-urgent messages can be sent to your provider as well.   To learn more about what you can do with MyChart, go to [email or web address].    Your next appointment:   4-8 week(s)- can have 3/17 at 11:40a or 4/1 at 11:40a or 1:40p  The format for your next appointment:   In Person  Provider:   You may see Dr. Daneen Schick or one of the following Advanced Practice Providers on your designated Care Team:    Kathyrn Drown, NP    Other Instructions      Signed, [name], MD  11/04/2020 9:32 AM    [place]

## 2020-11-04 ENCOUNTER — Other Ambulatory Visit: Payer: Self-pay

## 2020-11-04 ENCOUNTER — Ambulatory Visit: Payer: Federal, State, Local not specified - PPO | Admitting: Interventional Cardiology

## 2020-11-04 ENCOUNTER — Encounter: Payer: Self-pay | Admitting: Interventional Cardiology

## 2020-11-04 VITALS — BP 142/82 | HR 83 | Ht 69.0 in | Wt 249.8 lb

## 2020-11-04 DIAGNOSIS — E118 Type 2 diabetes mellitus with unspecified complications: Secondary | ICD-10-CM

## 2020-11-04 DIAGNOSIS — E781 Pure hyperglyceridemia: Secondary | ICD-10-CM

## 2020-11-04 DIAGNOSIS — F319 Bipolar disorder, unspecified: Secondary | ICD-10-CM

## 2020-11-04 DIAGNOSIS — R9431 Abnormal electrocardiogram [ECG] [EKG]: Secondary | ICD-10-CM

## 2020-11-04 DIAGNOSIS — I209 Angina pectoris, unspecified: Secondary | ICD-10-CM

## 2020-11-04 DIAGNOSIS — Z7189 Other specified counseling: Secondary | ICD-10-CM

## 2020-11-04 DIAGNOSIS — R079 Chest pain, unspecified: Secondary | ICD-10-CM

## 2020-11-04 DIAGNOSIS — C189 Malignant neoplasm of colon, unspecified: Secondary | ICD-10-CM

## 2020-11-04 LAB — LIPID PANEL
Chol/HDL Ratio: 42.5 ratio — ABNORMAL HIGH (ref 0.0–5.0)
Cholesterol, Total: 553 mg/dL — ABNORMAL HIGH (ref 100–199)
HDL: 13 mg/dL — ABNORMAL LOW (ref >39)
Triglycerides: 3204 mg/dL (ref 0–149)

## 2020-11-04 MED ORDER — ASPIRIN EC 81 MG PO TBEC: 81.0000 mg | DELAYED_RELEASE_TABLET | Freq: Every day | ORAL | 3 refills | Status: AC

## 2020-11-04 MED ORDER — ROSUVASTATIN CALCIUM 40 MG PO TABS: 40.0000 mg | ORAL_TABLET | Freq: Every day | ORAL | 3 refills | Status: DC

## 2020-11-04 MED ORDER — METFORMIN HCL 500 MG PO TABS: 500.0000 mg | ORAL_TABLET | Freq: Two times a day (BID) | ORAL | 0 refills | Status: DC

## 2020-11-04 MED ORDER — NITROGLYCERIN 0.4 MG SL SUBL: 0.4000 mg | SUBLINGUAL_TABLET | SUBLINGUAL | 3 refills | Status: DC | PRN

## 2020-11-04 NOTE — Patient Instructions (Addendum)
Medication Instructions:  1) START Aspirin 81mg  once daily 2)  A prescription has been sent in for Nitroglycerin.  If you have chest pain that doesn't relieve quickly, place one tablet under your tongue and allow it to dissolve.  If no relief after 5 minutes, you may take another pill.  If no relief after 5 minutes, you may take a 3rd dose but you need to call 911 and report to ER immediately. 3) START Metformin 500mg  twice daily 4) START Rosuvastatin 40mg  once daily  *If you need a refill on your cardiac medications before your next appointment, please call your pharmacy*   Lab Work: Lipid panel today  If you have labs (blood work) drawn today and your tests are completely normal, you will receive your results only by: Marland Kitchen MyChart Message (if you have MyChart) OR . A paper copy in the mail If you have any lab test that is abnormal or we need to change your treatment, we will call you to review the results.   Testing/Procedures: Your physician has requested that you have en exercise stress myoview. For further information please visit [email or web address]. Please follow instruction sheet, as given.   Follow-Up: At [facility], you and your health needs are our priority.  As part of our continuing mission to provide you with exceptional heart care, we have created designated Provider Care Teams.  These Care Teams include your primary Cardiologist (physician) and Advanced Practice Providers (APPs -  Physician Assistants and Nurse Practitioners) who all work together to provide you with the care you need, when you need it.  We recommend signing up for the patient portal called "MyChart".  Sign up information is provided on this After Visit Summary.  MyChart is used to connect with patients for Virtual Visits (Telemedicine).  Patients are able to view lab/test results, encounter notes, upcoming appointments, etc.  Non-urgent messages can be sent to your provider as well.   To learn more about  what you can do with MyChart, go to [email or web address].    Your next appointment:   4-8 week(s)- can have 3/17 at 11:40a or 4/1 at 11:40a or 1:40p  The format for your next appointment:   In Person  Provider:   You may see Dr. Daneen Schick or one of the following Advanced Practice Providers on your designated Care Team:    Kathyrn Drown, NP    Other Instructions

## 2020-11-04 NOTE — Addendum Note (Signed)
Addended by: Loren Racer on: 11/04/2020 10:24 AM   Modules accepted: Orders

## 2020-11-05 ENCOUNTER — Telehealth: Payer: Self-pay | Admitting: Interventional Cardiology

## 2020-11-05 NOTE — Telephone Encounter (Signed)
    Pt is returning call for his lab result 

## 2020-11-05 NOTE — Telephone Encounter (Signed)
Informed pt of results. Pt verbalized understanding. 

## 2020-11-09 NOTE — Addendum Note (Signed)
Addended by: Belva Crome on: 11/09/2020 05:08 PM   Modules accepted: Orders

## 2020-11-18 ENCOUNTER — Telehealth: Payer: Self-pay

## 2020-11-18 NOTE — Telephone Encounter (Signed)
Detailed instructions given. Asked to call back with any questions. S.Laela Deviney EMTP 

## 2020-11-19 ENCOUNTER — Other Ambulatory Visit: Payer: Self-pay

## 2020-11-19 ENCOUNTER — Ambulatory Visit (HOSPITAL_COMMUNITY): Payer: Federal, State, Local not specified - PPO | Attending: Cardiology

## 2020-11-19 DIAGNOSIS — R079 Chest pain, unspecified: Secondary | ICD-10-CM | POA: Insufficient documentation

## 2020-11-19 LAB — MYOCARDIAL PERFUSION IMAGING
Estimated workload: 10.1 METS
Exercise duration (min): 8 min
Exercise duration (sec): 15 s
LV dias vol: 107 mL (ref 62–150)
LV sys vol: 46 mL
MPHR: 159 {beats}/min
Peak HR: 155 {beats}/min
Percent HR: 97 %
Rest HR: 77 {beats}/min
SDS: 8
SRS: 0
SSS: 8
TID: 1.01

## 2020-11-19 MED ORDER — TECHNETIUM TC 99M TETROFOSMIN IV KIT
31.9000 | PACK | Freq: Once | INTRAVENOUS | Status: AC | PRN
  Administered 2020-11-19: 31.9 via INTRAVENOUS
  Filled 2020-11-19: qty 32

## 2020-11-19 MED ORDER — TECHNETIUM TC 99M TETROFOSMIN IV KIT
10.3000 | PACK | Freq: Once | INTRAVENOUS | Status: AC | PRN
  Administered 2020-11-19: 10.3 via INTRAVENOUS
  Filled 2020-11-19: qty 11

## 2020-11-24 ENCOUNTER — Telehealth: Payer: Self-pay | Admitting: *Deleted

## 2020-11-24 MED ORDER — METOPROLOL SUCCINATE ER 50 MG PO TB24: 50.0000 mg | ORAL_TABLET | Freq: Every day | ORAL | 3 refills | Status: DC

## 2020-11-24 NOTE — Telephone Encounter (Signed)
-----   Message from Belva Crome, MD sent at 11/19/2020  1:27 PM EST ----- Let the patient know the stress test suggest that we continue management of risk factors. Has exertional CP resolved? Study does demonstrate a blockage is present in one of his arteries. If symptoms are not improved with meds, will need cath. Start Metoprolol succinate 25 mg daily and after 2 weeks, increase to 50 mg daily. Needs OV in 3-4 weeks unless chest pain, which should lead to earlier OV. A copy will be sent to [name], MD

## 2020-11-24 NOTE — Telephone Encounter (Signed)
Spoke with pt and reviewed results and recommendations.  Pt verbalized understanding and was agreeable to plan.  Pt states he has not had any CP in over a month.

## 2020-12-12 NOTE — Progress Notes (Unsigned)
Cardiology Office Note:    Date:  12/17/2020   ID:  John Hubbard, DOB 1961-03-15, MRN [id number]  PCP:  [name], MD  Cardiologist:  [name], MD   Referring MD: [name], MD   Chief Complaint  Patient presents with  . Coronary Artery Disease  . Hyperlipidemia    History of Present Illness:    John Hubbard is a 60 y.o. male with a hx of chest pain referred for consultation.  Familial mixed hyperlipidemia,  Bipolar disorder, h/o resected colon cancer.  Since starting metoprolol succinate 50 mg/day, he has had no recurrence of chest pain.  He is now on rosuvastatin 40 mg/day and tolerating the medication well.  He has not needed to use sublingual nitroglycerin.  His nuclear stress test was mildly abnormal.  Anti-ischemic therapy was started.  Past Medical History:  Diagnosis Date  . Anxiety   . Bipolar disorder ([place])   . Colon cancer (H[facility])    hx colon cancer, surgery, chemotherapy -last 5'15  . Complication of anesthesia    woke-up during colonoscopy; difficult to wake  . Gout   . History of blood transfusion 2015  . Neuropathy     Past Surgical History:  Procedure Laterality Date  . COLONOSCOPY Bilateral 07/24/2013   Procedure: COLONOSCOPY;  Surgeon: [name], MD;  Location: [facility];  Service: Endoscopy;  Laterality: Bilateral;  . COLONOSCOPY N/A 06/14/2019   Procedure: COLONOSCOPY;  Surgeon: [name], MD;  Location: [facility];  Service: Endoscopy;  Laterality: N/A;  . COLONOSCOPY WITH PROPOFOL N/A 10/23/2014   Procedure: COLONOSCOPY WITH PROPOFOL;  Surgeon: [name], MD;  Location: [facility];  Service: Endoscopy;  Laterality: N/A;  . EUS N/A 12/15/2016   Procedure: UPPER ENDOSCOPIC ULTRASOUND (EUS) LINEAR;  Surgeon: [name], MD;  Location: [facility];  Service: Endoscopy;  Laterality: N/A;  . HERNIA REPAIR  9/44/96   Umbilical Hernia Repair  . LAPAROSCOPIC PARTIAL COLECTOMY N/A 08/14/2013   Procedure:  LAPAROSCOPIC PARTIAL COLECTOMY wound vac placement;  Surgeon: [name], MD;  Location: [facility];  Service: General;  Laterality: N/A;  . MEDIASTINOSCOPY N/A 12/28/2016   Procedure: MEDIASTINOSCOPY;  Surgeon: [name], MD;  Location: [facility];  Service: Thoracic;  Laterality: N/A;  . PORT-A-CATH REMOVAL N/A 05/14/2014   Procedure: REMOVAL PORT-A-CATH;  Surgeon: [name], MD;  Location: [facility];  Service: General;  Laterality: N/A;  . PORTACATH PLACEMENT Left 09/16/2013   Procedure: INSERTION PORT-A-CATH;  Surgeon: [name], MD;  Location: [facility];  Service: General;  Laterality: Left;  dressing change on lower abdominal wound  . VIDEO BRONCHOSCOPY WITH ENDOBRONCHIAL ULTRASOUND N/A 12/28/2016   Procedure: VIDEO BRONCHOSCOPY WITH ENDOBRONCHIAL ULTRASOUND;  Surgeon: [name], MD;  Location: [facility];  Service: Thoracic;  Laterality: N/A;    Current Medications: Current Meds  Medication Sig  . allopurinol (ZYLOPRIM) 300 MG tablet Take 1 tablet (300 mg total) by mouth daily.  Marland Kitchen aspirin EC 81 MG tablet Take 1 tablet (81 mg total) by mouth daily. Swallow whole.  . carbamazepine (TEGRETOL) 200 MG tablet Take 2 tablets (400 mg total) by mouth at bedtime. PATIENT NEEDS APPOINTMENT  . cholecalciferol (VITAMIN D3) 25 MCG (1000 UT) tablet Take 1,000 Units by mouth daily.  . Coenzyme Q10 (COQ10) 100 MG CAPS Take 100 mg by mouth daily.  . metFORMIN (GLUCOPHAGE) 500 MG tablet Take 1 tablet (500 mg total) by mouth 2 (two) times daily with a  meal.  . metoprolol succinate (TOPROL-XL) 50 MG 24 hr tablet Take 1 tablet (50 mg total) by mouth daily. Take with or immediately following a meal.  . Multiple Vitamins-Minerals (MULTIVITAMIN WITH MINERALS) tablet Take 1 tablet by mouth daily.  . nitroGLYCERIN (NITROSTAT) 0.4 MG SL tablet Place 1 tablet (0.4 mg total) under the tongue every 5 (five) minutes as needed for chest pain.  . rosuvastatin (CRESTOR) 40 MG tablet Take 1 tablet  (40 mg total) by mouth daily.  . vitamin C (ASCORBIC ACID) 500 MG tablet Take 500 mg by mouth daily.     Allergies:   Penicillins   Social History   Socioeconomic History  . Marital status: Married    Spouse name: Not on file  . Number of children: Not on file  . Years of education: Not on file  . Highest education level: Not on file  Occupational History  . Not on file  Tobacco Use  . Smoking status: Never Smoker  . Smokeless tobacco: Never Used  Substance and Sexual Activity  . Alcohol use: Yes    Comment: rare  . Drug use: No  . Sexual activity: Not on file  Other Topics Concern  . Not on file  Social History Narrative  . Not on file   Social Determinants of Health   Financial Resource Strain: Not on file  Food Insecurity: Not on file  Transportation Needs: Not on file  Physical Activity: Not on file  Stress: Not on file  Social Connections: Not on file     Family History: The patient's family history includes Heart disease in his father.  ROS:   Please see the history of present illness.    He feels well.  He is working hard and not having time to take care of himself.  All other systems reviewed and are negative.  EKGs/Labs/Other Studies Reviewed:    The following studies were reviewed today:  Hemoglobin A1c was 14   Total cholesterol 533 December 2021 and November 04, 2020   Triglycerides were elevated 3180 December 2021 and 3204 February 2022  [facility] STRESS TEST 11/19/2020 Study Highlights    The left ventricular ejection fraction is normal (55-65%).  Nuclear stress EF: 57%. No wall motion abnormality  There was no ST segment deviation noted during stress.  Defect 1: There is a small defect present in the apical inferior and apical lateral location. This apical inferolateral defect is partially reversible, mild ischemia may be present.  This is a low risk study.   Let the patient know the stress test suggest that we continue  management of risk factors. Has exertional CP resolved? Study does demonstrate a blockage is present in one of his arteries. If symptoms are not improved with meds, will need cath. Start Metoprolol succinate 25 mg daily and after 2 weeks, increase to 50 mg daily. Needs OV in 3-4 weeks unless chest pain, which should lead to earlier OV.    EKG:  EKG not repeated  Recent Labs: 09/17/2020: ALT 13; BUN 17; Creat 0.85; Hemoglobin 15.7; Platelets 210; Potassium 4.3; Sodium 131; TSH 1.13  Recent Lipid Panel    Component Value Date/Time   CHOL 553 (H) 11/04/2020 0946   TRIG 3,204 (HH) 11/04/2020 0946   HDL 13 (L) 11/04/2020 0946   CHOLHDL 42.5 (H) 11/04/2020 0946   CHOLHDL 19.1 (H) 09/17/2020 1543   LDLCALC Comment (A) 11/04/2020 0946   LDLCALC  09/17/2020 1543     Comment:     .  LDL cholesterol not calculated. Triglyceride levels greater than 400 mg/dL invalidate calculated LDL results. . Reference range: <100 . Desirable range <100 mg/dL for primary prevention;   <70 mg/dL for patients with CHD or diabetic patients  with > or = 2 CHD risk factors. Marland Kitchen LDL-C is now calculated using the Martin-Hopkins  calculation, which is a validated novel method providing  better accuracy than the Friedewald equation in the  estimation of LDL-C.  Cresenciano Genre et al. Annamaria Helling. 3419;379(02): 2061-2068  (http://education.[employer].com/faq/FAQ164)     Physical Exam:    VS:  BP 128/70   Pulse 74   Ht 5\' 9"  (1.753 m)   Wt 256 lb (116.1 kg)   SpO2 95%   BMI 37.80 kg/m     Wt Readings from Last 3 Encounters:  12/17/20 256 lb (116.1 kg)  11/19/20 249 lb (112.9 kg)  11/04/20 249 lb 12.8 oz (113.3 kg)     GEN: Obese. No acute distress HEENT: Normal NECK: No JVD. LYMPHATICS: No lymphadenopathy CARDIAC: No murmur. RRR no gallop, or edema. VASCULAR:  Normal Pulses. No bruits. RESPIRATORY:  Clear to auscultation without rales, wheezing or rhonchi  ABDOMEN: Soft, non-tender, non-distended, No  pulsatile mass, MUSCULOSKELETAL: No deformity  SKIN: Warm and dry NEUROLOGIC:  Alert and oriented x 3 PSYCHIATRIC:  Normal affect   ASSESSMENT:    1. Angina pectoris ([place])   2. Nonspecific abnormal electrocardiogram (ECG) (EKG)   3. Type 2 diabetes mellitus with complication, without long-term current use of insulin ([place])   4. Bipolar affective disorder, remission status unspecified ([place])   5. Hypertriglyceridemia   6. CAD in native artery    PLAN:    In order of problems listed above:  1. Resolved with metoprolol succinate 50 mg/day.  No nitroglycerin use.  Secondary prevention discussed. 2. EKG is not repeated. 3. On improve diet, hemoglobin A1c will be rechecked.  Since last evaluation Glucophage is also been added. 4. We did not discuss 5. Will refer to the advanced lipid clinic, Dr. Debara Pickett for what appears to be familial mixed hyperlipidemia.  Better glycemic control will help triglycerides.  He is not on a fibrate.  He is on max dose rosuvastatin.  May need to have a bile salt sequestrant, Vascepa, and fibrate added.  Overall education and awareness concerning primary/secondary risk prevention was discussed in detail: LDL less than 70, hemoglobin A1c less than 7, blood pressure target less than 130/80 mmHg, >150 minutes of moderate aerobic activity per week, avoidance of smoking, weight control (via diet and exercise), and continued surveillance/management of/for obstructive sleep apnea.    Medication Adjustments/Labs and Tests Ordered: Current medicines are reviewed at length with the patient today.  Concerns regarding medicines are outlined above.  Orders Placed This Encounter  Procedures  . Lipid panel  . Hepatic function panel  . TSH  . AMB Referral to Advanced Lipid Disorders Clinic   No orders of the defined types were placed in this encounter.   There are no Patient Instructions on file for this visit.   Signed, [name], MD  12/17/2020 12:24 PM     [facility]

## 2020-12-17 ENCOUNTER — Encounter: Payer: Self-pay | Admitting: Interventional Cardiology

## 2020-12-17 ENCOUNTER — Other Ambulatory Visit: Payer: Self-pay

## 2020-12-17 ENCOUNTER — Ambulatory Visit: Payer: Federal, State, Local not specified - PPO | Admitting: Interventional Cardiology

## 2020-12-17 VITALS — BP 128/70 | HR 74 | Ht 69.0 in | Wt 256.0 lb

## 2020-12-17 DIAGNOSIS — E781 Pure hyperglyceridemia: Secondary | ICD-10-CM | POA: Diagnosis not present

## 2020-12-17 DIAGNOSIS — F319 Bipolar disorder, unspecified: Secondary | ICD-10-CM

## 2020-12-17 DIAGNOSIS — E118 Type 2 diabetes mellitus with unspecified complications: Secondary | ICD-10-CM | POA: Diagnosis not present

## 2020-12-17 DIAGNOSIS — I209 Angina pectoris, unspecified: Secondary | ICD-10-CM | POA: Diagnosis not present

## 2020-12-17 DIAGNOSIS — I2510 Atherosclerotic heart disease of native coronary artery without angina pectoris: Secondary | ICD-10-CM | POA: Diagnosis not present

## 2020-12-17 DIAGNOSIS — R9431 Abnormal electrocardiogram [ECG] [EKG]: Secondary | ICD-10-CM

## 2020-12-17 NOTE — Patient Instructions (Addendum)
Medication Instructions:  Your physician recommends that you continue on your current medications as directed. Please refer to the Current Medication list given to you today.  *If you need a refill on your cardiac medications before your next appointment, please call your pharmacy*   Lab Work: Lipid, Liver, A1C and TSH today   If you have labs (blood work) drawn today and your tests are completely normal, you will receive your results only by: Marland Kitchen MyChart Message (if you have MyChart) OR . A paper copy in the mail If you have any lab test that is abnormal or we need to change your treatment, we will call you to review the results.   Testing/Procedures: None   Follow-Up:  You have been referred to Dr. Lys[facility] at our [facility] office.   At [facility], you and your health needs are our priority.  As part of our continuing mission to provide you with exceptional heart care, we have created designated Provider Care Teams.  These Care Teams include your primary Cardiologist (physician) and Advanced Practice Providers (APPs -  Physician Assistants and Nurse Practitioners) who all work together to provide you with the care you need, when you need it.  We recommend signing up for the patient portal called "MyChart".  Sign up information is provided on this After Visit Summary.  MyChart is used to connect with patients for Virtual Visits (Telemedicine).  Patients are able to view lab/test results, encounter notes, upcoming appointments, etc.  Non-urgent messages can be sent to your provider as well.   To learn more about what you can do with MyChart, go to [email or web address].    Your next appointment:   6-8 month(s)  The format for your next appointment:   In Person  Provider:   You may see [name], MD or one of the following Advanced Practice Providers on your designated Care Team:    Kathyrn Drown, NP    Other Instructions

## 2020-12-18 LAB — HEPATIC FUNCTION PANEL
ALT: 20 IU/L (ref 0–44)
AST: 17 IU/L (ref 0–40)
Albumin: 4.9 g/dL — ABNORMAL HIGH (ref 3.8–4.8)
Alkaline Phosphatase: 131 IU/L — ABNORMAL HIGH (ref 44–121)
Bilirubin Total: 0.3 mg/dL (ref 0.0–1.2)
Bilirubin, Direct: <0.1 mg/dL (ref 0.00–0.40)
Total Protein: 7.4 g/dL (ref 6.0–8.5)

## 2020-12-18 LAB — LIPID PANEL
Chol/HDL Ratio: 7.6 ratio — ABNORMAL HIGH (ref 0.0–5.0)
Cholesterol, Total: 205 mg/dL — ABNORMAL HIGH (ref 100–199)
HDL: 27 mg/dL — ABNORMAL LOW (ref >39)
Triglycerides: 861 mg/dL (ref 0–149)

## 2020-12-18 LAB — HEMOGLOBIN A1C
Est. average glucose Bld gHb Est-mCnc: 275 mg/dL
Hgb A1c MFr Bld: 11.2 % — ABNORMAL HIGH (ref 4.8–5.6)

## 2020-12-18 LAB — TSH: TSH: 2.12 u[IU]/mL (ref 0.450–4.500)

## 2021-01-15 ENCOUNTER — Encounter: Payer: Self-pay | Admitting: Internal Medicine

## 2021-01-30 ENCOUNTER — Other Ambulatory Visit: Payer: Self-pay | Admitting: Interventional Cardiology

## 2021-02-19 ENCOUNTER — Inpatient Hospital Stay: Payer: Federal, State, Local not specified - PPO | Attending: Oncology | Admitting: Oncology

## 2021-02-19 ENCOUNTER — Other Ambulatory Visit: Payer: Self-pay

## 2021-02-19 ENCOUNTER — Ambulatory Visit: Payer: Federal, State, Local not specified - PPO | Admitting: Oncology

## 2021-02-19 VITALS — BP 142/84 | HR 80 | Temp 97.8°F | Resp 18 | Ht 69.0 in | Wt 257.0 lb

## 2021-02-19 DIAGNOSIS — Z9221 Personal history of antineoplastic chemotherapy: Secondary | ICD-10-CM

## 2021-02-19 DIAGNOSIS — E785 Hyperlipidemia, unspecified: Secondary | ICD-10-CM | POA: Diagnosis not present

## 2021-02-19 DIAGNOSIS — C189 Malignant neoplasm of colon, unspecified: Secondary | ICD-10-CM

## 2021-02-19 DIAGNOSIS — Z85038 Personal history of other malignant neoplasm of large intestine: Secondary | ICD-10-CM | POA: Insufficient documentation

## 2021-02-19 NOTE — Progress Notes (Signed)
John Hubbard OFFICE PROGRESS NOTE   Diagnosis: Colon cancer  INTERVAL HISTORY:   John Hubbard returns as scheduled.  He feels well.  He reports intermittent diarrhea over the past month.  Good appetite and energy level.  No bleeding.  He had COVID-19 infection in January of this year.  He has been evaluated by Dr. Tamala Julian for angina.  He has been referred to the advanced lipid clinic.    Objective:  Vital signs in last 24 hours:  Blood pressure (!) 142/84, pulse 80, temperature 97.8 F (36.6 C), temperature source Oral, resp. rate 18, height 5' 9"  (1.753 m), weight 257 lb (116.6 kg), SpO2 95 %.    Lymphatics: No cervical, supraclavicular, axillary, or inguinal nodes Resp: Lungs clear bilaterally Cardio: Regular rate and rhythm GI: No mass, nontender, no hepatosplenomegaly Vascular: No leg edema   Lab Results:  Lab Results  Component Value Date   WBC 5.9 09/17/2020   HGB 15.7 09/17/2020   HCT 45.7 09/17/2020   MCV 81.0 09/17/2020   PLT 210 09/17/2020   NEUTROABS 3.9 10/28/2016    CMP  Lab Results  Component Value Date   NA 131 (L) 09/17/2020   K 4.3 09/17/2020   CL 95 (L) 09/17/2020   CO2 27 09/17/2020   GLUCOSE 371 (H) 09/17/2020   BUN 17 09/17/2020   CREATININE 0.85 09/17/2020   CALCIUM 9.5 09/17/2020   PROT 7.4 12/17/2020   ALBUMIN 4.9 (H) 12/17/2020   AST 17 12/17/2020   ALT 20 12/17/2020   ALKPHOS 131 (H) 12/17/2020   BILITOT 0.3 12/17/2020   GFRNONAA >60 04/16/2019   GFRAA >60 04/16/2019    Lab Results  Component Value Date   CEA1 3.75 04/16/2019   Medications: I have reviewed the patient's current medications.   Assessment/Plan: 1. 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 The tumor returned microsatellite stable with no loss of mismatch repair protein expression.  Cycle 1 adjuvant CAPOX beginning 09/18/2013.   Cycle 7 CAPOX 01/22/2014   Cycle  8 CAPOX 02/12/2014 (oxaliplatin deleted).  Surveillance colonoscopy January 2016  CT scans chest, abdomen and pelvis 08/15/2014 with no evidence of metastatic disease.  CT scans 11/15/2016-interval development of mediastinal adenopathy. Enlarging nodule within right upper lobe. No mass or adenopathy identified within the abdomen or pelvis.  PET scan 12/05/2016-FDG avid seen in the mediastinum and hila. Nodule in the medial right upper lobe increased since 2015, no abnormal FDG uptake in the region of the nodule. Epicardial node inferior to the liver dome FDG avid. Porta hepatis and portacaval nodes FDG avid as well. Also FDG avid node anterior to the right side of the crus.  EUS biopsy of a portacaval node on 12/15/2016-nondiagnostic  Bronchoscopy/mediastinoscopy 12/28/2016-biopsies of level 7 and leve l4R nodes negative for malignancy. Changes consistent with a fibrotic lymph node-potentially old granulomas identified on the level 7 node  CT chest 04/16/2019-resolution of mediastinal adenopathy, decrease in size of right upper lobe subpleural nodule, morphologic features of cirrhosis  Colonoscopy 06/14/2019-negative 2. Gout. He continues allopurinol. 3. History of anxiety maintained on Tegretol. 4. Hypertension. 5. History of delayed nausea. Improved with Aloxi/Emend 6. Rash-the rash occurred following cycle 7 CAPOX. It is possible the rash was related to capecitabine. Improved.  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 9. Hyperlipidemia    Disposition: John Hubbard remains in clinical remission  from colon cancer.  He would like to continue follow-up at the Cancer center.  He will return for an office visit in 1 year.  He will continue colonoscopy surveillance with Dr. Marcello Moores.    Betsy Coder, MD  02/19/2021  12:16 PM

## 2021-03-04 ENCOUNTER — Telehealth: Payer: Federal, State, Local not specified - PPO | Admitting: Internal Medicine

## 2021-04-11 ENCOUNTER — Other Ambulatory Visit: Payer: Self-pay | Admitting: Family Medicine

## 2021-04-11 DIAGNOSIS — M1A9XX0 Chronic gout, unspecified, without tophus (tophi): Secondary | ICD-10-CM

## 2021-04-30 ENCOUNTER — Telehealth: Payer: Self-pay

## 2021-04-30 ENCOUNTER — Encounter: Payer: Self-pay | Admitting: Internal Medicine

## 2021-04-30 ENCOUNTER — Telehealth (INDEPENDENT_AMBULATORY_CARE_PROVIDER_SITE_OTHER): Payer: Federal, State, Local not specified - PPO | Admitting: Internal Medicine

## 2021-04-30 DIAGNOSIS — Z8249 Family history of ischemic heart disease and other diseases of the circulatory system: Secondary | ICD-10-CM

## 2021-04-30 DIAGNOSIS — E781 Pure hyperglyceridemia: Secondary | ICD-10-CM | POA: Diagnosis not present

## 2021-04-30 DIAGNOSIS — E118 Type 2 diabetes mellitus with unspecified complications: Secondary | ICD-10-CM

## 2021-04-30 MED ORDER — ICOSAPENT ETHYL 1 G PO CAPS: 2.0000 g | ORAL_CAPSULE | Freq: Two times a day (BID) | ORAL | 3 refills | Status: DC

## 2021-04-30 NOTE — Progress Notes (Signed)
Virtual Visit via Video Note   This visit type was conducted due to national recommendations for restrictions regarding the COVID-19 Pandemic (e.g. social distancing) in an effort to limit this patient's exposure and mitigate transmission in our community.  Due to his co-morbid illnesses, this patient is at least at moderate risk for complications without adequate follow up.  This format is felt to be most appropriate for this patient at this time.  All issues noted in this document were discussed and addressed.  A limited physical exam was performed with this format.  Please refer to the patient's chart for his consent to telehealth for [facility].      Date:  04/30/2021   ID:  [name], DOB 09-Dec-1960, MRN [id number] The patient was identified using 2 identifiers.  Evaluation Performed:  New Patient Evaluation  Patient Location:  [place] [address]  Provider location:   [address] [place], [place] [address]  PCP:  [name], MD  Cardiologist:  [name], MD Electrophysiologist:  None   Chief Complaint:  Manage dyslipidemia  History of Present Illness:    John Hubbard is a 60 y.o. male who presents via audio/video conferencing for a telehealth visit today.  This is a pleasant gentleman kindly referred by Dr. Tamala Julian for evaluation and management of dyslipidemia.  He was noted to have significantly elevated cholesterol, primarily driven by triglycerides.  His lipid profile from 5 months ago showed total cholesterol 553, triglycerides 3000 204, HDL 13 and LDL was not calculated.  Subsequently he had been started on 40 mg of rosuvastatin however repeat labs just a month after that showed already a marked improvement in his lipids with a decrease in his triglycerides from 3204 to 861.  This was also probably due to poorly controlled diabetes.  His hemoglobin A1c was 14.1 and came down to 11.2 with some therapy.  I discussed that  further with him today.  He says he does not have a glucometer or regularly check his blood sugars.  He is only on metformin at this time.  He has made some dietary changes.  Primarily he is looking to reduce carbohydrates but was not aware that he do need to watch saturated fats in his diet.  He has no known history of coronary disease although had recent stress testing.  There is family history of heart disease and elevated triglycerides in his father and his brother.  Unfortunately, LDL is not known because of his high triglycerides a direct LDL was not measured.  The patient does not have symptoms concerning for COVID-19 infection (fever, chills, cough, or new SHORTNESS OF BREATH).    Prior CV studies:   The following studies were reviewed today:  Chart reviewed, labwork  PMHx:  Past Medical History:  Diagnosis Date   Anxiety    Bipolar disorder ([place])    Colon cancer ([place])    hx colon cancer, surgery, chemotherapy -last [id number]   Complication of anesthesia    woke-up during colonoscopy; difficult to wake   Gout    History of blood transfusion 2015   Neuropathy     Past Surgical History:  Procedure Laterality Date   COLONOSCOPY Bilateral 07/24/2013   Procedure: COLONOSCOPY;  Surgeon: [name], MD;  Location: [facility];  Service: Endoscopy;  Laterality: Bilateral;   COLONOSCOPY N/A 06/14/2019   Procedure: COLONOSCOPY;  Surgeon: [name], MD;  [facility]: [facility];  Service: Endoscopy;  Laterality: N/A;  COLONOSCOPY WITH PROPOFOL N/A 10/23/2014   Procedure: COLONOSCOPY WITH PROPOFOL;  Surgeon: [name], MD;  [facility]: [facility];  Service: Endoscopy;  Laterality: N/A;   EUS N/A 12/15/2016   Procedure: UPPER ENDOSCOPIC ULTRASOUND (EUS) LINEAR;  Surgeon: [name], MD;  Location: [facility];  Service: Endoscopy;  Laterality: N/A;   HERNIA REPAIR  [id number]   Umbilical Hernia Repair   LAPAROSCOPIC PARTIAL COLECTOMY N/A 08/14/2013   Procedure: LAPAROSCOPIC  PARTIAL COLECTOMY wound vac placement;  Surgeon: [name], MD;  Location: [facility];  Service: General;  Laterality: N/A;   MEDIASTINOSCOPY N/A 12/28/2016   Procedure: MEDIASTINOSCOPY;  Surgeon: [name], MD;  Location: [facility];  Service: Thoracic;  Laterality: N/A;   PORT-A-CATH REMOVAL N/A 05/14/2014   Procedure: REMOVAL PORT-A-CATH;  Surgeon: [name], MD;  Location: [facility];  Service: General;  Laterality: N/A;   PORTACATH PLACEMENT Left 09/16/2013   Procedure: INSERTION PORT-A-CATH;  Surgeon: [name], MD;  Location: [facility];  Service: General;  Laterality: Left;  dressing change on lower abdominal wound   VIDEO BRONCHOSCOPY WITH ENDOBRONCHIAL ULTRASOUND N/A 12/28/2016   Procedure: VIDEO BRONCHOSCOPY WITH ENDOBRONCHIAL ULTRASOUND;  Surgeon: [name], MD;  Location: [facility];  Service: Thoracic;  Laterality: N/A;    FAMHx:  Family History  Problem Relation Age of Onset   Heart disease Father     SOCHx:   reports that he has never smoked. He has never used smokeless tobacco. He reports current alcohol use. He reports that he does not use drugs.  ALLERGIES:  Allergies  Allergen Reactions   Penicillins Hives and Other (See Comments)    Has patient had a PCN reaction causing immediate rash, facial/tongue/throat swelling, SOB or lightheadedness with hypotension: No Has patient had a PCN reaction causing SEVERE RASH INVOLVING MUCUS MEMBRANES or SKIN NECROSIS: #  #  #  YES  #  #  #  Has patient had a PCN reaction that required hospitalization #  #  #  YES  #  #  #  Has patient had a PCN reaction occurring within the last 10 years: No     MEDS:  Current Meds  Medication Sig   allopurinol (ZYLOPRIM) 300 MG tablet TAKE 1 TABLET BY MOUTH EVERY DAY   aspirin EC 81 MG tablet Take 1 tablet (81 mg total) by mouth daily. Swallow whole.   carbamazepine (TEGRETOL) 200 MG tablet Take 2 tablets (400 mg total) by mouth at bedtime. PATIENT NEEDS APPOINTMENT    cholecalciferol (VITAMIN D3) 25 MCG (1000 UT) tablet Take 1,000 Units by mouth daily.   Coenzyme Q10 (COQ10) 100 MG CAPS Take 100 mg by mouth daily.   icosapent Ethyl (VASCEPA) 1 g capsule Take 2 capsules (2 g total) by mouth 2 (two) times daily.   metFORMIN (GLUCOPHAGE) 500 MG tablet TAKE 1 TABLET BY MOUTH 2 TIMES DAILY WITH A MEAL.   Multiple Vitamins-Minerals (MULTIVITAMIN WITH MINERALS) tablet Take 1 tablet by mouth daily.   rosuvastatin (CRESTOR) 40 MG tablet Take 1 tablet (40 mg total) by mouth daily.   vitamin C (ASCORBIC ACID) 500 MG tablet Take 500 mg by mouth daily.     ROS: Pertinent items noted in HPI and remainder of comprehensive ROS otherwise negative.  Labs/Other Tests and Data Reviewed:    Recent Labs: 09/17/2020: BUN 17; Creat 0.85; Hemoglobin 15.7; Platelets 210; Potassium 4.3; Sodium 131 12/17/2020: ALT 20; TSH 2.120   Recent Lipid Panel Lab Results  Component Value  Date/Time   CHOL 205 (H) 12/17/2020 12:45 PM   TRIG 861 (HH) 12/17/2020 12:45 PM   HDL 27 (L) 12/17/2020 12:45 PM   CHOLHDL 7.6 (H) 12/17/2020 12:45 PM   CHOLHDL 19.1 (H) 09/17/2020 03:43 PM   LDLCALC Comment (A) 12/17/2020 12:45 PM   [place]  09/17/2020 03:43 PM     Comment:     . LDL cholesterol not calculated. Triglyceride levels greater than 400 mg/dL invalidate calculated LDL results. . Reference range: <100 . Desirable range <100 mg/dL for primary prevention;   <70 mg/dL for patients with CHD or diabetic patients  with > or = 2 CHD risk factors. Marland Kitchen LDL-C is now calculated using the Martin-Hopkins  calculation, which is a validated novel method providing  better accuracy than the Friedewald equation in the  estimation of LDL-C.  Cresenciano Genre et al. Annamaria Helling. [id number]): 2061-2068  (http://education.Quest[employer].com/faq/FAQ164)     Wt Readings from Last 3 Encounters:  02/19/21 257 lb (116.6 kg)  12/17/20 256 lb (116.1 kg)  11/19/20 249 lb (112.9 kg)     Exam:    Vital Signs:   There were no vitals taken for this visit.   General appearance: alert and no distress Lungs: no audible wheezing Abdomen: obese Extremities: extremities normal, atraumatic, no cyanosis or edema Skin: Skin color, texture, turgor normal. 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  He noted that there was early heart disease in his father and brother therefore suggesting that this may be pathogenic.  It certainly would be helpful to get a genetic test information and he will consider that.  I agree with adding the statin we have not actually seen the results of that yet although his triglycerides had come down tremendously, this likely was prior to releasing the benefits of the statin and I suspect was related to improvement in blood sugar.  His A1c of 14 was primarily driving his triglycerides in the thousands and that has improved.  He still would be a good candidate to add Vascepa for cardiovascular risk reduction and also to try to further drive the triglycerides down below 500.  He does have Pharmacist, community through his wife and should be able to get the medication for little to no cost.  We will go ahead and start 2 g twice daily in addition to his statin.  Plan repeat lipids, direct LDL, a NMR and APO B in about 3 months.  I will defer to his PCP about additional medication for diabetes however Mr. Kiehn is agreeable to starting to monitor his blood sugars.  Thanks again for the kind referral.  COVID-19 Education: The signs and symptoms of COVID-19 were discussed with the patient and how to seek care for testing (follow up with PCP or arrange E-visit).  The importance of social distancing was discussed today.  Patient  Risk:   After full review of this patients clinical status, I feel that they are at least moderate risk at this time.  Time:   Today, I have spent 25 minutes with the patient with telehealth technology discussing high triglycerides, dietary approaches to control cholesterol, management of diabetes.     Medication Adjustments/Labs and Tests Ordered: Current medicines are reviewed at length with the patient today.  Concerns regarding medicines  are outlined above.   Tests Ordered: Orders Placed This Encounter  Procedures   NMR, lipoprofile   LDL cholesterol, direct   Apolipoprotein B    Medication Changes: Meds ordered this encounter  Medications   icosapent Ethyl (VASCEPA) 1 g capsule    Sig: Take 2 capsules (2 g total) by mouth 2 (two) times daily.    Dispense:  360 capsule    Refill:  3    Prior Auth will be submitted if needed    Disposition:  in 3 month(s)  Pixie Casino, MD, [facility], [place] Director of the Advanced Lipid Disorders &  Cardiovascular Risk Reduction Clinic Diplomate of the American Board of Clinical Lipidology Attending Cardiologist  Direct Dial: [phone]  Fax: [phone]  Website:  [email or web address]  Pixie Casino, MD  04/30/2021 10:19 AM

## 2021-04-30 NOTE — Telephone Encounter (Addendum)
**Note De-identified Cia Garretson Obfuscation** -----  **Note De-Identified Sheniece Ruggles Obfuscation** Message from Fidel Levy, RN sent at 04/30/2021  9:11 AM EDT ----- Regarding: vascpea prior auth  Dr. Debara Pickett has prescribed vascepa 1 gram capsule - take 2 capsules PO BID  I have sent the prescription to [facility] which is a mail order pharmacy.   Thanks

## 2021-04-30 NOTE — Patient Instructions (Addendum)
Medication Instructions:  START vascepa 2 capsules twice daily -- a prescription has been sent to [facility] (mail order pharmacy) where you should be able to get the medication for free or a reduced price (phone: [phone]) -- co-pay card for retail pharmacy: [email or web address]  *If you need a refill on your cardiac medications before your next appointment, please call your pharmacy*   Lab Work: FASTING lab work in 3-4 months -- complete about 1 week before your next visit - NMR lipoprofile - Apolipoprotein B - Direct LDL  If you have labs (blood work) drawn today and your tests are completely normal, you will receive your results only by: [place] (if you have MyChart) OR A paper copy in the mail If you have any lab test that is abnormal or we need to change your treatment, we will call you to review the results.   Testing/Procedures: NONE   Follow-Up: At [facility], you and your health needs are our priority.  As part of our continuing mission to provide you with exceptional heart care, we have created designated Provider Care Teams.  These Care Teams include your primary Cardiologist (physician) and Advanced Practice Providers (APPs -  Physician Assistants and Nurse Practitioners) who all work together to provide you with the care you need, when you need it.  We recommend signing up for the patient portal called "MyChart".  Sign up information is provided on this After Visit Summary.  MyChart is used to connect with patients for Virtual Visits (Telemedicine).  Patients are able to view lab/test results, encounter notes, upcoming appointments, etc.  Non-urgent messages can be sent to your provider as well.   To learn more about what you can do with MyChart, go to [email or web address].    Your next appointment:   3-4 month(s) - lipid clinic  The format for your next appointment:   In Person or Virtual  Provider:   K. Mali Hilty, MD   Other  Instructions

## 2021-04-30 NOTE — Telephone Encounter (Signed)
**Note De-Identified Dejuan Elman Obfuscation** Vascepa PA done through covermymeds:  Francine Graven (Key: [id number]) Vascepa 1GM capsules Form:[employer] Electronic PA Form (2017 NCPDP) Determination: N/A Message from Plan Your PA has been resolved, no additional PA is required.   [facility] has been notified of this approval Yassmine Tamm covermymeds.

## 2021-05-01 ENCOUNTER — Other Ambulatory Visit: Payer: Self-pay | Admitting: Interventional Cardiology

## 2021-05-12 ENCOUNTER — Telehealth: Payer: Self-pay | Admitting: Internal Medicine

## 2021-05-12 NOTE — Telephone Encounter (Signed)
He could try to decrease to 1 capsule twice daily (morning and evening) and see if that helps  Dr Lemmie Evens

## 2021-05-12 NOTE — Telephone Encounter (Signed)
Patient returning call.

## 2021-05-12 NOTE — Telephone Encounter (Signed)
Called patient, relayed the following from Dr. Debara Pickett:  Debara Pickett, Nadean Corwin, MD to Me       1:41 PM Note He could try to decrease to 1 capsule twice daily (morning and evening) and see if that helps   Dr Lemmie Evens     Patient verbalized understanding. All questions/concerns addressed at this time. Medication list updated to reflect changes.

## 2021-05-12 NOTE — Telephone Encounter (Signed)
Attempted to call patient, no answer. Left nonspecific message for patient to return office's call.

## 2021-05-12 NOTE — Telephone Encounter (Signed)
Pt c/o medication issue:  1. Name of Medication:  icosapent Ethyl (VASCEPA) 1 g capsule  2. How are you currently taking this medication (dosage and times per day)? 2 capsules twice a day  3. Are you having a reaction (difficulty breathing--STAT)? no  4. What is your medication issue? Patient states the medication has been giving him very bad diarrhea. He would like to know if he can lower it to 1 capsule twice a day.

## 2021-05-12 NOTE — Telephone Encounter (Signed)
Returned pt call, pt reports since starting his Vascepa 2 capsules twice daily 1 week ago he has had diarrhea. Pt denies any other symptoms, including shortness of breath, chest pain or rash. Pt wanted to know if he might be able to switch to taking just 1 capsule twice daily or possibly switch to a different medication. RN advised pt his concerns would be forwarded to Dr. Debara Pickett for review.

## 2021-05-29 ENCOUNTER — Other Ambulatory Visit: Payer: Self-pay | Admitting: Interventional Cardiology

## 2021-05-31 NOTE — Telephone Encounter (Signed)
Needs to come from PCP

## 2021-06-13 IMAGING — CT CT CHEST WITH CONTRAST
2 of 3 series · 15 of 36 positions shown, 18 images · IV contrast (omnipaque)
Comparison: 11/15/2016.

CLINICAL DATA: Follow-up lung nodule.

EXAM:
CT CHEST WITH CONTRAST
TECHNIQUE: Multidetector CT imaging of the chest was performed during
intravenous contrast administration.
CONTRAST:  75mL OMNIPAQUE IOHEXOL 300 MG/ML  SOLN

[Series 2: axial st · axial · 0.98mm/px · z∈[-597,-319]mm · 12 of 165 slices shown, 15 images]
[im 13/165  mediastinal]
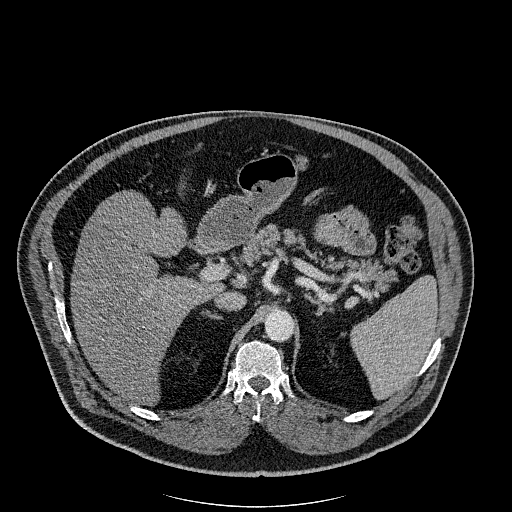
[im 13/165  lung]
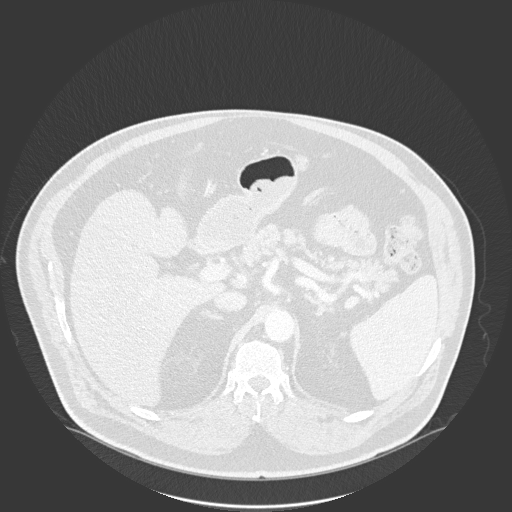
[im 25/165  lung]
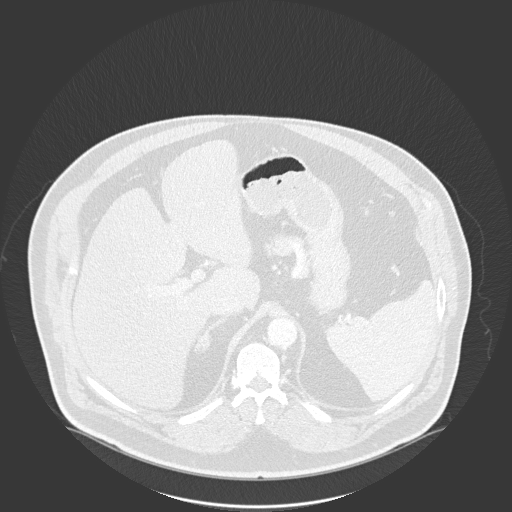
[im 37/165  lung]
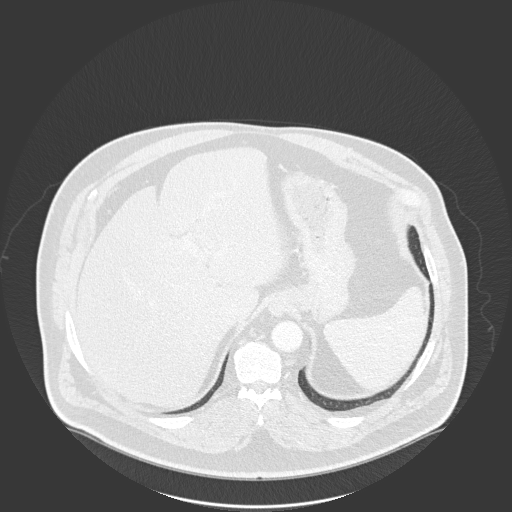
[im 49/165  lung]
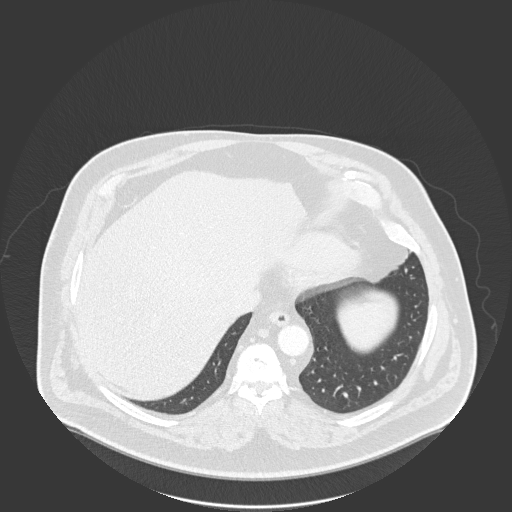
[im 61/165  mediastinal]
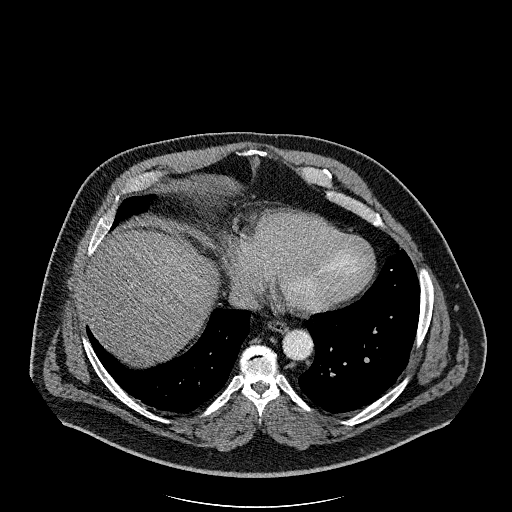
[im 61/165  lung]
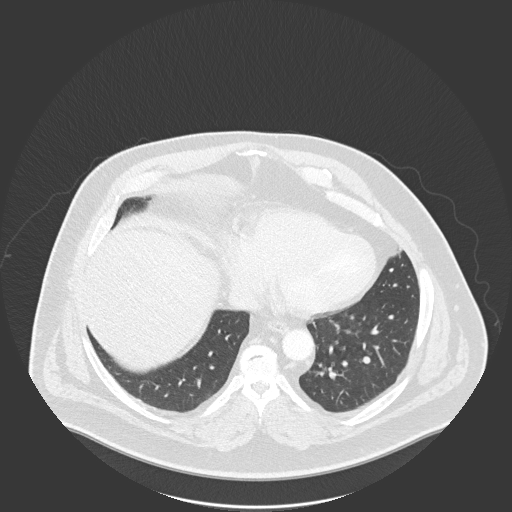
[im 73/165  lung]
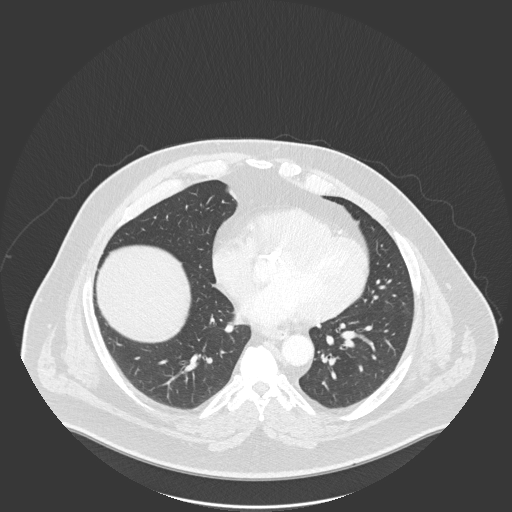
[im 92/165  lung]
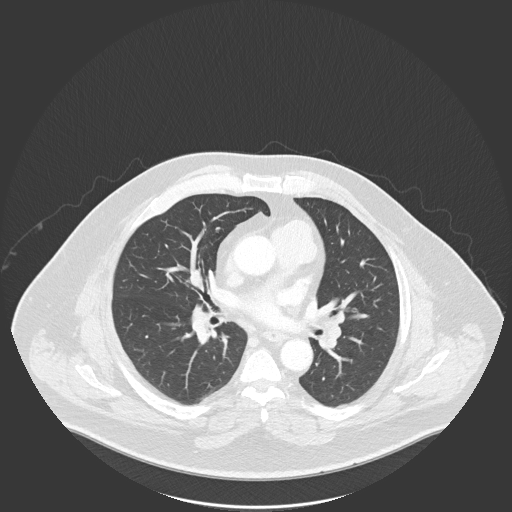
[im 104/165  lung]
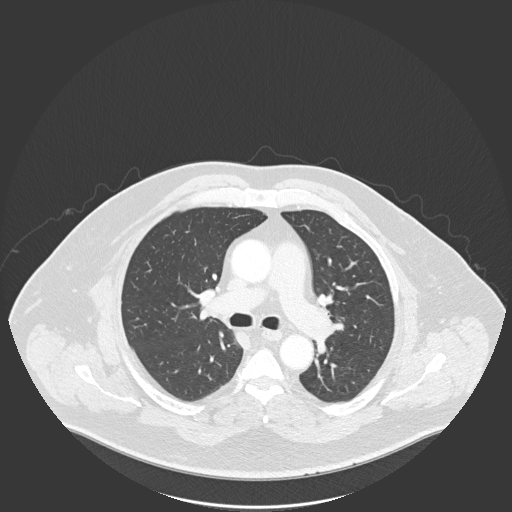
[im 116/165  mediastinal]
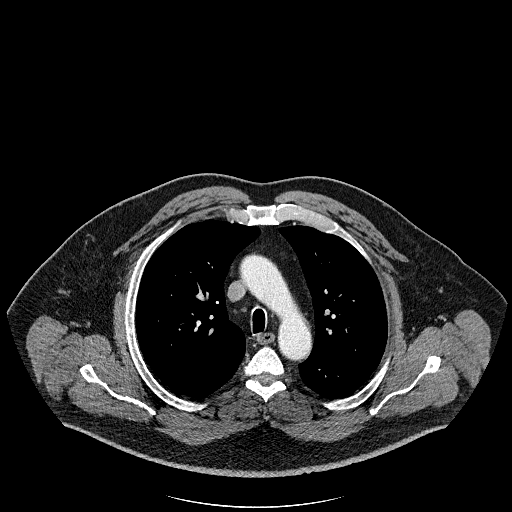
[im 116/165  lung]
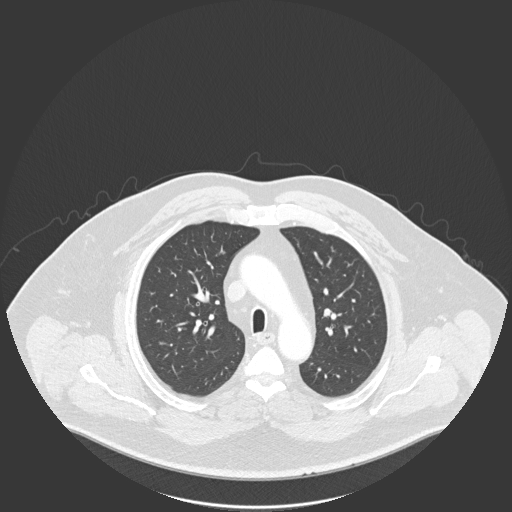
[im 128/165  lung]
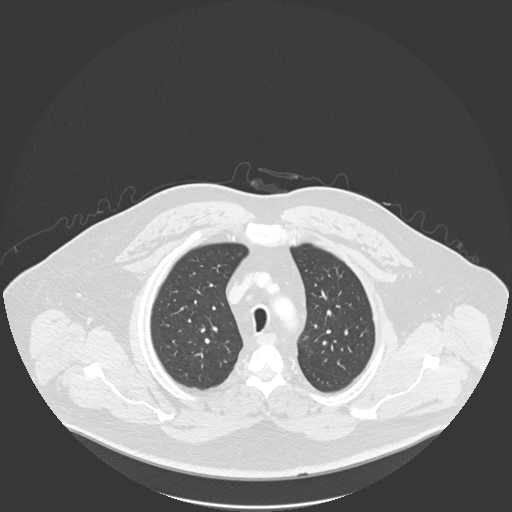
[im 140/165  lung]
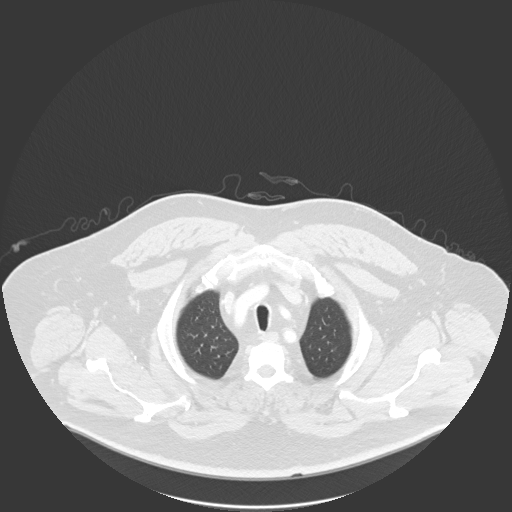
[im 152/165  lung]
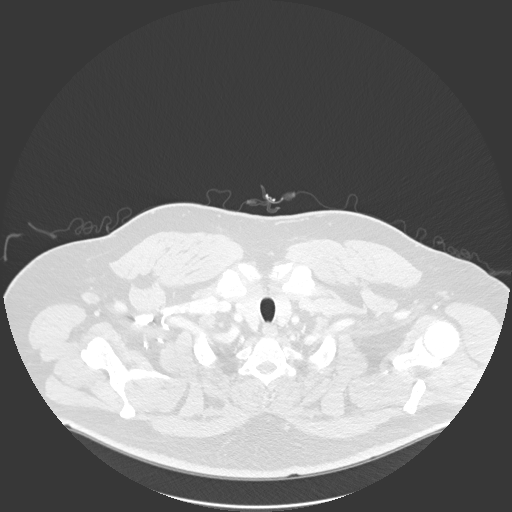

[Series 5: coronal · coronal · 0.63mm/px · 3 of 182 slices shown]
[im 37/182  lung]
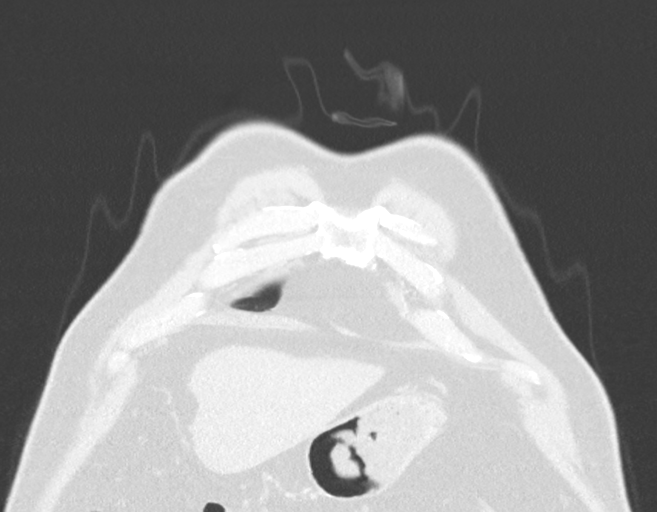
[im 73/182  lung]
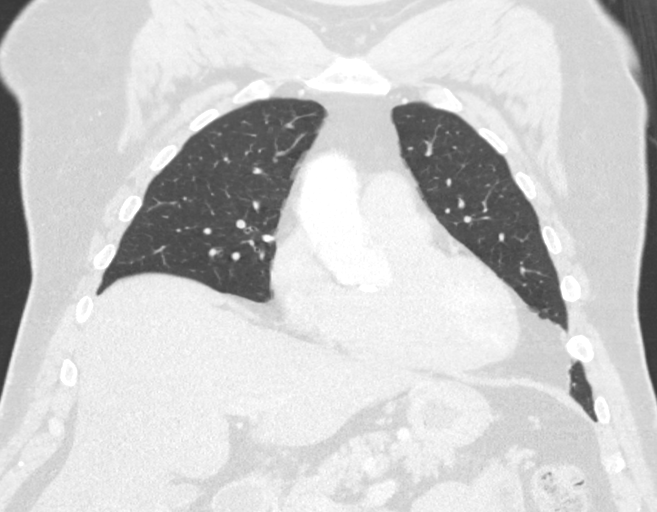
[im 109/182  lung]
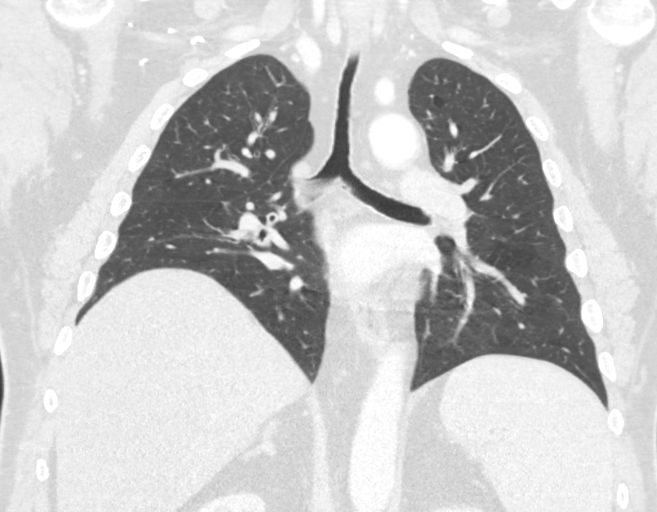

[15 of 36 positions shown; findings below may reference images not displayed]

FINDINGS: Cardiovascular: The heart size appears normal. No pericardial
effusion.

Mediastinum/Nodes: Normal appearance of the thyroid gland. The
trachea appears patent and is midline. Normal appearance of the
esophagus. No enlarged supraclavicular or axillary adenopathy. No
mediastinal or hilar adenopathy.

Lungs/Pleura: No airspace consolidation or atelectasis. Anteromedial
right upper lobe nodule measures 4 mm. Previously 7 mm. No new
pulmonary nodule or mass.

Upper Abdomen: Hypertrophy of the lateral segment of left lobe of
liver and caudate lobe. Mild hepatic steatosis. No acute
abnormality.

Musculoskeletal: No chest wall abnormality. No acute or significant
osseous findings.
IMPRESSION: 1. Resolution of previous mediastinal adenopathy.
2. Decrease in size of subpleural nodule within the anteromedial
right upper lobe.
3. Morphologic features of the liver suggestive of cirrhosis.
Clinical correlation advised.

## 2021-06-24 ENCOUNTER — Ambulatory Visit: Payer: Federal, State, Local not specified - PPO | Admitting: Interventional Cardiology

## 2021-08-05 ENCOUNTER — Encounter: Payer: Self-pay | Admitting: Internal Medicine

## 2021-08-05 ENCOUNTER — Telehealth: Payer: Federal, State, Local not specified - PPO | Admitting: Internal Medicine

## 2021-08-05 VITALS — Wt 250.0 lb

## 2021-08-05 DIAGNOSIS — E118 Type 2 diabetes mellitus with unspecified complications: Secondary | ICD-10-CM | POA: Diagnosis not present

## 2021-08-05 DIAGNOSIS — I2510 Atherosclerotic heart disease of native coronary artery without angina pectoris: Secondary | ICD-10-CM

## 2021-08-05 DIAGNOSIS — E781 Pure hyperglyceridemia: Secondary | ICD-10-CM

## 2021-08-05 NOTE — Patient Instructions (Addendum)
Medication Instructions:  INCREASE vascepa to 1 capsule twice daily  *If you need a refill on your cardiac medications before your next appointment, please call your pharmacy*   Lab Work: FASTING lab work as soon as you are able -- NMR lipoprofile, apolipoprotein B, direct LDL, A1c  And FASTING lab work in 3-4 months (about 1 week before your next visit) -- NMR lipoprofile, direct LDL and A1c  The lab work can be done at any [facility] location   If you have labs (blood work) drawn today and your tests are completely normal, you will receive your results only by: [employer] (if you have [place]) OR A paper copy in the mail If you have any lab test that is abnormal or we need to change your treatment, we will call you to review the results.   Testing/Procedures: NONE   Follow-Up: At [facility], you and your health needs are our priority.  As part of our continuing mission to provide you with exceptional heart care, we have created designated Provider Care Teams.  These Care Teams include your primary Cardiologist (physician) and Advanced Practice Providers (APPs -  Physician Assistants and Nurse Practitioners) who all work together to provide you with the care you need, when you need it.  We recommend signing up for the patient portal called "MyChart".  Sign up information is provided on this After Visit Summary.  MyChart is used to connect with patients for Virtual Visits (Telemedicine).  Patients are able to view lab/test results, encounter notes, upcoming appointments, etc.  Non-urgent messages can be sent to your provider as well.   To learn more about what you can do with MyChart, go to [email or web address].    Your next appointment:   3-4 month(s)  The format for your next appointment:   In Person or Virtual  Provider:   Raliegh Ip Mali Hilty, MD   Other Instructions

## 2021-08-05 NOTE — Progress Notes (Signed)
Virtual Visit via Video Note   This visit type was conducted due to national recommendations for restrictions regarding the COVID-19 Pandemic (e.g. social distancing) in an effort to limit this patient's exposure and mitigate transmission in our community.  Due to his co-morbid illnesses, this patient is at least at moderate risk for complications without adequate follow up.  This format is felt to be most appropriate for this patient at this time.  All issues noted in this document were discussed and addressed.  A limited physical exam was performed with this format.  Please refer to the patient's chart for his consent to telehealth for [facility].      Date:  08/05/2021   ID:  John Hubbard, DOB 04-16-1961, MRN [id number] The patient was identified using 2 identifiers.  Evaluation Performed:  New Patient Evaluation  Patient Location:  [place] [address]  Provider location:   [address] [place],  [address]  PCP:  [name], MD  Cardiologist:  [name], MD Electrophysiologist:  None   Chief Complaint:  Manage dyslipidemia  History of Present Illness:    John Hubbard is a 60 y.o. male who presents via audio/video conferencing for a telehealth visit today.  This is a pleasant gentleman kindly referred by Dr. Tamala Julian for evaluation and management of dyslipidemia.  He was noted to have significantly elevated cholesterol, primarily driven by triglycerides.  His lipid profile from 5 months ago showed total cholesterol 553, triglycerides 3000 204, HDL 13 and LDL was not calculated.  Subsequently he had been started on 40 mg of rosuvastatin however repeat labs just a month after that showed already a marked improvement in his lipids with a decrease in his triglycerides from 3204 to 861.  This was also probably due to poorly controlled diabetes.  His hemoglobin A1c was 14.1 and came down to 11.2 with some therapy.  I discussed that  further with him today.  He says he does not have a glucometer or regularly check his blood sugars.  He is only on metformin at this time.  He has made some dietary changes.  Primarily he is looking to reduce carbohydrates but was not aware that he do need to watch saturated fats in his diet.  He has no known history of coronary disease although had recent stress testing.  There is family history of heart disease and elevated triglycerides in his father and his brother.  Unfortunately, LDL is not known because of his high triglycerides a direct LDL was not measured.  08/05/2021  [name] returns today for follow-up.  He unfortunately was not able to get his labs drawn before this visit.  He has been trying to work on his diet and some weight loss.  Particularly his been cutting back carbohydrates.  His blood sugars were elevated with his last A1c just over 11.  He has not had a repeat to my knowledge.  He is on metformin.  He may need additional therapy.  He was not able to tolerate 2 g of Vascepa due to some stomach upset.  He cut that back down to 1 g a day, but was confused that the actual dose should be 2 g twice a day and therefore he is getting about a quarter of the amount of Vascepa he should be.  We discussed this and he is willing to try to take an additional capsule at night.  I have also encouraged more  physical activity if he is able to do that because this will continue to help with his numbers.  The patient does not have symptoms concerning for COVID-19 infection (fever, chills, cough, or new SHORTNESS OF BREATH).    Prior CV studies:   The following studies were reviewed today:  Chart reviewed, labwork  PMHx:  Past Medical History:  Diagnosis Date   Anxiety    Bipolar disorder ([place])    Colon cancer ([place])    hx colon cancer, surgery, chemotherapy -last 1'88   Complication of anesthesia    woke-up during colonoscopy; difficult to wake   Gout    History of blood transfusion  2015   Neuropathy     Past Surgical History:  Procedure Laterality Date   COLONOSCOPY Bilateral 07/24/2013   Procedure: COLONOSCOPY;  Surgeon: [name], MD;  [facility];  Service: Endoscopy;  Laterality: Bilateral;   COLONOSCOPY N/A 06/14/2019   Procedure: COLONOSCOPY;  Surgeon: [name], MD;  [facility];  Service: Endoscopy;  Laterality: N/A;   COLONOSCOPY WITH PROPOFOL N/A 10/23/2014   Procedure: COLONOSCOPY WITH PROPOFOL;  Surgeon: [name], MD;  [facility];  Service: Endoscopy;  Laterality: N/A;   EUS N/A 12/15/2016   Procedure: UPPER ENDOSCOPIC ULTRASOUND (EUS) LINEAR;  Surgeon: [name], MD;  [facility];  Service: Endoscopy;  Laterality: N/A;   HERNIA REPAIR  01/17/59   Umbilical Hernia Repair   LAPAROSCOPIC PARTIAL COLECTOMY N/A 08/14/2013   Procedure: LAPAROSCOPIC PARTIAL COLECTOMY wound vac placement;  Surgeon: [name], MD;  [facility];  Service: General;  Laterality: N/A;   MEDIASTINOSCOPY N/A 12/28/2016   Procedure: MEDIASTINOSCOPY;  Surgeon: [name], MD;  Location: [place];  Service: Thoracic;  Laterality: N/A;   PORT-A-CATH REMOVAL N/A 05/14/2014   Procedure: REMOVAL PORT-A-CATH;  Surgeon: [name], MD;  [facility];  Service: General;  Laterality: N/A;   PORTACATH PLACEMENT Left 09/16/2013   Procedure: INSERTION PORT-A-CATH;  Surgeon: [name], MD;  Location: [place];  Service: General;  Laterality: Left;  dressing change on lower abdominal wound   VIDEO BRONCHOSCOPY WITH ENDOBRONCHIAL ULTRASOUND N/A 12/28/2016   Procedure: VIDEO BRONCHOSCOPY WITH ENDOBRONCHIAL ULTRASOUND;  Surgeon: [name], MD;  [facility];  Service: Thoracic;  Laterality: N/A;    FAMHx:  Family History  Problem Relation Age of Onset   Heart disease Father     SOCHx:   reports that he has never smoked. He has never used smokeless tobacco. He reports current alcohol use.  He reports that he does not use drugs.  ALLERGIES:  Allergies  Allergen Reactions   Penicillins Hives and Other (See Comments)    Has patient had a PCN reaction causing immediate rash, facial/tongue/throat swelling, SOB or lightheadedness with hypotension: No Has patient had a PCN reaction causing SEVERE RASH INVOLVING MUCUS MEMBRANES or SKIN NECROSIS: #  #  #  YES  #  #  #  Has patient had a PCN reaction that required hospitalization #  #  #  YES  #  #  #  Has patient had a PCN reaction occurring within the last 10 years: No     MEDS:  Current Meds  Medication Sig   allopurinol (ZYLOPRIM) 300 MG tablet TAKE 1 TABLET BY MOUTH EVERY DAY   aspirin EC 81 MG tablet Take 1 tablet (81 mg total) by mouth daily. Swallow whole.   carbamazepine (TEGRETOL) 200 MG tablet Take 2 tablets (400  mg total) by mouth at bedtime. PATIENT NEEDS APPOINTMENT   cholecalciferol (VITAMIN D3) 25 MCG (1000 UT) tablet Take 1,000 Units by mouth daily.   Coenzyme Q10 (COQ10) 100 MG CAPS Take 100 mg by mouth daily.   icosapent Ethyl (VASCEPA) 1 g capsule Take 1 g by mouth daily.   metFORMIN (GLUCOPHAGE) 500 MG tablet TAKE 1 TABLET BY MOUTH 2 TIMES DAILY WITH A MEAL.   Multiple Vitamins-Minerals (MULTIVITAMIN WITH MINERALS) tablet Take 1 tablet by mouth daily.   rosuvastatin (CRESTOR) 40 MG tablet Take 1 tablet (40 mg total) by mouth daily.   vitamin C (ASCORBIC ACID) 500 MG tablet Take 500 mg by mouth daily.     ROS: Pertinent items noted in HPI and remainder of comprehensive ROS otherwise negative.  Labs/Other Tests and Data Reviewed:    Recent Labs: 09/17/2020: BUN 17; Creat 0.85; Hemoglobin 15.7; Platelets 210; Potassium 4.3; Sodium 131 12/17/2020: ALT 20; TSH 2.120   Recent Lipid Panel Lab Results  Component Value Date/Time   CHOL 205 (H) 12/17/2020 12:45 PM   TRIG 861 (HH) 12/17/2020 12:45 PM   HDL 27 (L) 12/17/2020 12:45 PM   CHOLHDL 7.6 (H) 12/17/2020 12:45 PM   CHOLHDL 19.1 (H) 09/17/2020 03:43 PM    LDLCALC Comment (A) 12/17/2020 12:45 PM   [place]  09/17/2020 03:43 PM     Comment:     . LDL cholesterol not calculated. Triglyceride levels greater than 400 mg/dL invalidate calculated LDL results. . Reference range: <100 . Desirable range <100 mg/dL for primary prevention;   <70 mg/dL for patients with CHD or diabetic patients  with > or = 2 CHD risk factors. Marland Kitchen LDL-C is now calculated using the Martin-Hopkins  calculation, which is a validated novel method providing  better accuracy than the Friedewald equation in the  estimation of LDL-C.  Cresenciano Genre et al. Annamaria Helling. 0347;425(95): 2061-2068  (http://education.[facility].com/faq/FAQ164)     Wt Readings from Last 3 Encounters:  08/05/21 250 lb (113.4 kg)  02/19/21 257 lb (116.6 kg)  12/17/20 256 lb (116.1 kg)     Exam:    Vital Signs:  Wt 250 lb (113.4 kg)   BMI 36.92 kg/m    General appearance: alert and no distress Lungs: no audible wheezing Abdomen: obese Extremities: extremities normal, atraumatic, no cyanosis or edema Skin: Skin color, texture, turgor normal. 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 Eddins has made some diet and lifestyle changes but unfortunately did not get his labs drawn.  Those are still in the system and I have encouraged him to do that but we will add an A1c as well.  He may need up titration of his medications.  He certainly needs to continue to work on weight loss and dietary changes.  He is okay to try to take the Vascepa 1 capsule (1 g) twice daily which would give him more benefit although this is 50% of the ideal dose.  We will plan a follow-up in about 3 to 4 months and repeat testing at that time.  COVID-19 Education: The signs and symptoms of COVID-19 were discussed with the  patient and how to seek care for testing (follow up with PCP or arrange E-visit).  The importance of social distancing was discussed today.  Patient Risk:   After full review of this patients clinical  status, I feel that they are at least moderate risk at this time.  Time:   Today, I have spent 25 minutes with the patient with telehealth technology discussing high triglycerides, dietary approaches to control cholesterol, management of diabetes.     Medication Adjustments/Labs and Tests Ordered: Current medicines are reviewed at length with the patient today.  Concerns regarding medicines are outlined above.   Tests Ordered: No orders of the defined types were placed in this encounter.   Medication Changes: No orders of the defined types were placed in this encounter.   Disposition:  in 3 month(s)  Pixie Casino, MD, [facility], [place] Director of the Advanced Lipid Disorders &  Cardiovascular Risk Reduction Clinic Diplomate of the American Board of Clinical Lipidology Attending Cardiologist  Direct Dial: [phone]  Fax: [phone]  Website:  [email or web address]  Pixie Casino, MD  08/05/2021 9:06 AM

## 2021-08-10 ENCOUNTER — Other Ambulatory Visit: Payer: Self-pay | Admitting: Family Medicine

## 2021-08-10 DIAGNOSIS — M1A9XX0 Chronic gout, unspecified, without tophus (tophi): Secondary | ICD-10-CM

## 2021-11-01 ENCOUNTER — Other Ambulatory Visit: Payer: Self-pay | Admitting: Interventional Cardiology

## 2021-11-16 ENCOUNTER — Other Ambulatory Visit: Payer: Self-pay | Admitting: Interventional Cardiology

## 2021-11-21 NOTE — Progress Notes (Signed)
Cardiology Office Note:    Date:  11/22/2021   ID:  John Hubbard, DOB [birth year], MRN [id number]  PCP:  John Barrack, MD  Cardiologist:  John Grooms, MD   Referring MD: John Barrack, MD   Chief Complaint  Patient presents with   Follow-up    Hyperlipidemia Type 2 diabetes Morbid obesity Familial hyperlipidemia    History of Present Illness:    John Hubbard is a 61 y.o. male with a hx of  Familial mixed hyperlipidemia, DM II, morbid obesity, inferior infarction pattern on ECG, Bipolar disorder, and h/o resected colon cancer.   He denies angina, he denies dyspnea on exertion, he denies orthopnea, and palpitations.  He has not had any chest pain in 2 years.  He is working as a Conservator, museum/gallery.  He does have to do walking at his job.  No limitations.  He has not followed up on his blood sugars.  He is not followed up on his lipids (Dr. Debara Hubbard).  Past Medical History:  Diagnosis Date   Anxiety    Bipolar disorder ([place])    Colon cancer ([place])    hx colon cancer, surgery, chemotherapy -last 4'65   Complication of anesthesia    woke-up during colonoscopy; difficult to wake   Gout    History of blood transfusion 2015   Neuropathy     Past Surgical History:  Procedure Laterality Date   COLONOSCOPY Bilateral 07/24/2013   Procedure: COLONOSCOPY;  Surgeon: John Ruff, MD;  [facility];  Service: Endoscopy;  Laterality: Bilateral;   COLONOSCOPY N/A 06/14/2019   Procedure: COLONOSCOPY;  Surgeon: John Ruff, MD;  [facility];  Service: Endoscopy;  Laterality: N/A;   COLONOSCOPY WITH PROPOFOL N/A 10/23/2014   Procedure: COLONOSCOPY WITH PROPOFOL;  Surgeon: John Ruff, MD;  [facility];  Service: Endoscopy;  Laterality: N/A;   EUS N/A 12/15/2016   Procedure: UPPER ENDOSCOPIC ULTRASOUND (EUS) LINEAR;  Surgeon: John Banister, MD;  [facility];  Service: Endoscopy;  Laterality: N/A;   HERNIA REPAIR  6/81/27    Umbilical Hernia Repair   LAPAROSCOPIC PARTIAL COLECTOMY N/A 08/14/2013   Procedure: LAPAROSCOPIC PARTIAL COLECTOMY wound vac placement;  Surgeon: John Ruff, MD;  [facility];  Service: General;  Laterality: N/A;   MEDIASTINOSCOPY N/A 12/28/2016   Procedure: MEDIASTINOSCOPY;  Surgeon: John Isaac, MD;  Location: [place];  Service: Thoracic;  Laterality: N/A;   PORT-A-CATH REMOVAL N/A 05/14/2014   Procedure: REMOVAL PORT-A-CATH;  Surgeon: John Ruff, MD;  [facility];  Service: General;  Laterality: N/A;   PORTACATH PLACEMENT Left 09/16/2013   Procedure: INSERTION PORT-A-CATH;  Surgeon: John Ruff, MD;  Location: [place];  Service: General;  Laterality: Left;  dressing change on lower abdominal wound   VIDEO BRONCHOSCOPY WITH ENDOBRONCHIAL ULTRASOUND N/A 12/28/2016   Procedure: VIDEO BRONCHOSCOPY WITH ENDOBRONCHIAL ULTRASOUND;  Surgeon: John Isaac, MD;  [facility];  Service: Thoracic;  Laterality: N/A;    Current Medications: Current Meds  Medication Sig   allopurinol (ZYLOPRIM) 300 MG tablet TAKE 1 TABLET BY MOUTH EVERY DAY   aspirin EC 81 MG tablet Take 1 tablet (81 mg total) by mouth daily. Swallow whole.   carbamazepine (TEGRETOL) 200 MG tablet TAKE 2 TABLETS (400 MG TOTAL) BY MOUTH AT BEDTIME. PATIENT NEEDS APPOINTMENT   cholecalciferol (VITAMIN D3) 25 MCG (1000 UT) tablet Take 1,000 Units by mouth daily.   Coenzyme Q10 (COQ10) 100 MG CAPS Take  100 mg by mouth daily.   icosapent Ethyl (VASCEPA) 1 g capsule Take 1 g by mouth daily.   metFORMIN (GLUCOPHAGE) 500 MG tablet TAKE 1 TABLET BY MOUTH 2 TIMES DAILY WITH A MEAL.   metoprolol succinate (TOPROL-XL) 50 MG 24 hr tablet Take 1 tablet (50 mg total) by mouth daily. TAKE WITH OR IMMEDIATELY FOLLOWING A MEAL. Please keep upcoming appt. With Dr. Tamala Hubbard in Feb. in order to receive further refills. Thank You.   Multiple Vitamins-Minerals (MULTIVITAMIN WITH MINERALS) tablet Take 1 tablet by mouth  daily.   rosuvastatin (CRESTOR) 40 MG tablet TAKE 1 TABLET BY MOUTH EVERY DAY   vitamin C (ASCORBIC ACID) 500 MG tablet Take 500 mg by mouth daily.     Allergies:   Penicillins   Social History   Socioeconomic History   Marital status: Married    Spouse name: Not on file   Number of children: Not on file   Years of education: Not on file   Highest education level: Not on file  Occupational History   Not on file  Tobacco Use   Smoking status: Never   Smokeless tobacco: Never  Substance and Sexual Activity   Alcohol use: Yes    Comment: rare   Drug use: No   Sexual activity: Not on file  Other Topics Concern   Not on file  Social History Narrative   Not on file   Social Determinants of Health   Financial Resource Strain: Not on file  Food Insecurity: Not on file  Transportation Needs: Not on file  Physical Activity: Not on file  Stress: Not on file  Social Connections: Not on file     Family History: The patient's family history includes Heart disease in his father.  ROS:   Please see the history of present illness.    Sleeps well.  Denies claudication.  No syncope or palpitations.  All other systems reviewed and are negative.  EKGs/Labs/Other Studies Reviewed:    The following studies were reviewed today:  Myocardial Perfusion Imaging 2/20222: Images were personally reviewed and my opinion is congruent with this report. Study Highlights    The left ventricular ejection fraction is normal (55-65%). Nuclear stress EF: 57%. No wall motion abnormality There was no ST segment deviation noted during stress. Defect 1: There is a small defect present in the apical inferior and apical lateral location. This apical inferolateral defect is partially reversible, mild ischemia may be present. This is a low risk study.    EKG:  EKG normal sinus rhythm, inferior Q waves, left axis deviation, poor R wave progression V1 through V4.  When compared to prior 09/17/2020, no  changes occurred.  Recent Labs: 12/17/2020: ALT 20; TSH 2.120  Recent Lipid Panel    Component Value Date/Time   CHOL 205 (H) 12/17/2020 1245   TRIG 861 (HH) 12/17/2020 1245   HDL 27 (L) 12/17/2020 1245   CHOLHDL 7.6 (H) 12/17/2020 1245   CHOLHDL 19.1 (H) 09/17/2020 1543   LDLCALC Comment (A) 12/17/2020 1245   [place]  09/17/2020 1543     Comment:     . LDL cholesterol not calculated. Triglyceride levels greater than 400 mg/dL invalidate calculated LDL results. . Reference range: <100 . Desirable range <100 mg/dL for primary prevention;   <70 mg/dL for patients with CHD or diabetic patients  with > or = 2 CHD risk factors. Marland Kitchen LDL-C is now calculated using the Martin-Hopkins  calculation, which is a validated novel method providing  better accuracy than the Friedewald equation in the  estimation of LDL-C.  Cresenciano Genre et al. Annamaria Helling. 8563;149(70): 2061-2068  (http://education.[facility].com/faq/FAQ164)     Physical Exam:    VS:  BP 138/84    Pulse 74    Ht 5\' 9"  (1.753 m)    Wt 249 lb (112.9 kg)    SpO2 96%    BMI 36.77 kg/m     Wt Readings from Last 3 Encounters:  11/22/21 249 lb (112.9 kg)  08/05/21 250 lb (113.4 kg)  02/19/21 257 lb (116.6 kg)     GEN: Morbid. No acute distress HEENT: Normal NECK: No JVD. LYMPHATICS: No lymphadenopathy CARDIAC: No murmur. RRR no gallop, or edema. VASCULAR:  normal Pulses. No bruits. RESPIRATORY:  Clear to auscultation without rales, wheezing or rhonchi  ABDOMEN: Soft, non-tender, non-distended, No pulsatile mass, MUSCULOSKELETAL: No deformity  SKIN: Warm and dry NEUROLOGIC:  Alert and oriented x 3 PSYCHIATRIC:  Normal affect   ASSESSMENT:    1. Angina pectoris ([place])   2. Type 2 diabetes mellitus with complication, without long-term current use of insulin ([place])   3. Hypertriglyceridemia   4. Malignant neoplasm of colon, unspecified part of colon ([place])   5. Bipolar affective disorder, remission status unspecified ([place])    6. Morbid obesity ([place])    PLAN:    In order of problems listed above:  Has not had angina in greater than 2 years.  Continue aggressive risk factor modification.  He is in the advanced lipid clinic with Dr. Debara Hubbard.  If has recurrent chest discomfort, he will need to have an anatomical assessment of coronary arteries from either coronary CTA or coronary angiography. No data on diabetes.  He is on metformin.  I encouraged him to follow-up with his primary care concerning glycemic control. Continue statin and Vascepa therapy.  Follow-up with Dr. Debara Hubbard. Did not discuss did not discuss. Lose weight  Overall education and awareness concerning primary/secondary risk prevention was discussed in detail: LDL less than 70, hemoglobin A1c less than 7, blood pressure target less than 130/80 mmHg, >150 minutes of moderate aerobic activity per week, avoidance of smoking, weight control (via diet and exercise), and continued surveillance/management of/for obstructive sleep apnea.  We will plan to see him back in 6 to 8 months.   Medication Adjustments/Labs and Tests Ordered: Current medicines are reviewed at length with the patient today.  Concerns regarding medicines are outlined above.  Orders Placed This Encounter  Procedures   EKG 12-Lead   No orders of the defined types were placed in this encounter.   Patient Instructions  Medication Instructions:  Your physician recommends that you continue on your current medications as directed. Please refer to the Current Medication list given to you today.  *If you need a refill on your cardiac medications before your next appointment, please call your pharmacy*   Lab Work: NONE If you have labs (blood work) drawn today and your tests are completely normal, you will receive your results only by: [place] (if you have MyChart) OR A paper copy in the mail If you have any lab test that is abnormal or we need to change your treatment, we will  call you to review the results.   Testing/Procedures: NONE   Follow-Up: At [facility], you and your health needs are our priority.  As part of our continuing mission to provide you with exceptional heart care, we have created designated Provider Care Teams.  These Care Teams include your primary Cardiologist (  physician) and Advanced Practice Providers (APPs -  Physician Assistants and Nurse Practitioners) who all work together to provide you with the care you need, when you need it.  We recommend signing up for the patient portal called "MyChart".  Sign up information is provided on this After Visit Summary.  MyChart is used to connect with patients for Virtual Visits (Telemedicine).  Patients are able to view lab/test results, encounter notes, upcoming appointments, etc.  Non-urgent messages can be sent to your provider as well.   To learn more about what you can do with MyChart, go to [email or web address].    Your next appointment:   9 month(s)  The format for your next appointment:   In Person  Provider:   [name], MD    Signed, John Grooms, MD  11/22/2021 4:42 PM    [place]

## 2021-11-22 ENCOUNTER — Other Ambulatory Visit: Payer: Self-pay

## 2021-11-22 ENCOUNTER — Encounter: Payer: Self-pay | Admitting: Interventional Cardiology

## 2021-11-22 ENCOUNTER — Ambulatory Visit: Payer: Federal, State, Local not specified - PPO | Admitting: Interventional Cardiology

## 2021-11-22 VITALS — BP 138/84 | HR 74 | Ht 69.0 in | Wt 249.0 lb

## 2021-11-22 DIAGNOSIS — E781 Pure hyperglyceridemia: Secondary | ICD-10-CM

## 2021-11-22 DIAGNOSIS — I209 Angina pectoris, unspecified: Secondary | ICD-10-CM | POA: Diagnosis not present

## 2021-11-22 DIAGNOSIS — C189 Malignant neoplasm of colon, unspecified: Secondary | ICD-10-CM

## 2021-11-22 DIAGNOSIS — F319 Bipolar disorder, unspecified: Secondary | ICD-10-CM

## 2021-11-22 DIAGNOSIS — E118 Type 2 diabetes mellitus with unspecified complications: Secondary | ICD-10-CM | POA: Diagnosis not present

## 2021-11-22 NOTE — Patient Instructions (Signed)
Medication Instructions:  Your physician recommends that you continue on your current medications as directed. Please refer to the Current Medication list given to you today.  *If you need a refill on your cardiac medications before your next appointment, please call your pharmacy*   Lab Work: NONE If you have labs (blood work) drawn today and your tests are completely normal, you will receive your results only by: [place] (if you have MyChart) OR A paper copy in the mail If you have any lab test that is abnormal or we need to change your treatment, we will call you to review the results.   Testing/Procedures: NONE   Follow-Up: At [facility], you and your health needs are our priority.  As part of our continuing mission to provide you with exceptional heart care, we have created designated Provider Care Teams.  These Care Teams include your primary Cardiologist (physician) and Advanced Practice Providers (APPs -  Physician Assistants and Nurse Practitioners) who all work together to provide you with the care you need, when you need it.  We recommend signing up for the patient portal called "MyChart".  Sign up information is provided on this After Visit Summary.  MyChart is used to connect with patients for Virtual Visits (Telemedicine).  Patients are able to view lab/test results, encounter notes, upcoming appointments, etc.  Non-urgent messages can be sent to your provider as well.   To learn more about what you can do with MyChart, go to [email or web address].    Your next appointment:   9 month(s)  The format for your next appointment:   In Person  Provider:   [name], MD

## 2021-11-23 ENCOUNTER — Other Ambulatory Visit: Payer: Self-pay | Admitting: Interventional Cardiology

## 2021-11-27 ENCOUNTER — Other Ambulatory Visit: Payer: Self-pay | Admitting: Family Medicine

## 2021-11-27 ENCOUNTER — Telehealth: Payer: Self-pay | Admitting: Interventional Cardiology

## 2021-11-29 NOTE — Telephone Encounter (Signed)
We originally prescribed and told him that PCP needed to take over.  Please have refill sent to PCP.

## 2021-11-29 NOTE — Telephone Encounter (Signed)
Pt's pharmacy is requesting a refill on Metformin. Would Dr. Tamala Julian like to refill this medication? Please address

## 2021-12-01 NOTE — Telephone Encounter (Signed)
Patient has called in requesting Dr. Jerline Pain to refill.

## 2021-12-01 NOTE — Telephone Encounter (Signed)
Patient needs OV last visit on 09/17/2020 ?

## 2021-12-06 ENCOUNTER — Telehealth: Payer: Self-pay | Admitting: *Deleted

## 2021-12-06 ENCOUNTER — Encounter: Payer: Self-pay | Admitting: Family Medicine

## 2021-12-06 ENCOUNTER — Ambulatory Visit: Payer: Federal, State, Local not specified - PPO | Admitting: Family Medicine

## 2021-12-06 ENCOUNTER — Other Ambulatory Visit: Payer: Self-pay

## 2021-12-06 VITALS — BP 144/84 | HR 76 | Temp 97.4°F | Ht 69.0 in | Wt 248.2 lb

## 2021-12-06 DIAGNOSIS — E785 Hyperlipidemia, unspecified: Secondary | ICD-10-CM | POA: Diagnosis not present

## 2021-12-06 DIAGNOSIS — I152 Hypertension secondary to endocrine disorders: Secondary | ICD-10-CM

## 2021-12-06 DIAGNOSIS — E1169 Type 2 diabetes mellitus with other specified complication: Secondary | ICD-10-CM | POA: Insufficient documentation

## 2021-12-06 DIAGNOSIS — Z794 Long term (current) use of insulin: Secondary | ICD-10-CM | POA: Diagnosis not present

## 2021-12-06 DIAGNOSIS — I10 Essential (primary) hypertension: Secondary | ICD-10-CM | POA: Insufficient documentation

## 2021-12-06 DIAGNOSIS — E119 Type 2 diabetes mellitus without complications: Secondary | ICD-10-CM | POA: Insufficient documentation

## 2021-12-06 DIAGNOSIS — R739 Hyperglycemia, unspecified: Secondary | ICD-10-CM

## 2021-12-06 DIAGNOSIS — E1165 Type 2 diabetes mellitus with hyperglycemia: Secondary | ICD-10-CM | POA: Diagnosis not present

## 2021-12-06 DIAGNOSIS — E1159 Type 2 diabetes mellitus with other circulatory complications: Secondary | ICD-10-CM

## 2021-12-06 LAB — POCT GLYCOSYLATED HEMOGLOBIN (HGB A1C): Hemoglobin A1C: 14.5 % — AB (ref 4.0–5.6)

## 2021-12-06 MED ORDER — METFORMIN HCL 500 MG PO TABS: 500.0000 mg | ORAL_TABLET | Freq: Two times a day (BID) | ORAL | 1 refills | Status: DC

## 2021-12-06 MED ORDER — TIRZEPATIDE 2.5 MG/0.5ML ~~LOC~~ SOAJ: 2.5000 mg | SUBCUTANEOUS | 0 refills | Status: DC

## 2021-12-06 NOTE — Assessment & Plan Note (Addendum)
A1c very elevated at 14.5.  We discussed starting insulin however deferred.  He does not currently have any catabolic symptoms or concerning signs for ketosis so this is reasonable for now.  We discussed the importance of good glycemic control for prevention of cardiovascular complications and prevention of acute issues  ? ?Discussed lifestyle modifications. ? ?He is on metformin.  We will add on Mounjaro and escalate the dose as tolerated.  He will check with me in a couple of weeks via MyChart.  Recheck A1c in 3 months. ?

## 2021-12-06 NOTE — Progress Notes (Signed)
? ?  John Hubbard is a 62 y.o. male who presents today for an office visit. ? ?Assessment/Plan:  ?Chronic Problems Addressed Today: ?T2DM (type 2 diabetes mellitus) ([place]) ?A1c very elevated at 14.5.  We discussed starting insulin however deferred.  He does not currently have any catabolic symptoms or concerning signs for ketosis so this is reasonable for now.  We discussed the importance of good glycemic control for prevention of cardiovascular complications and prevention of acute issues  ? ?Discussed lifestyle modifications. ? ?He is on metformin.  We will add on Mounjaro and escalate the dose as tolerated.  He will check with me in a couple of weeks via MyChart.  Recheck A1c in 3 months. ? ?Dyslipidemia associated with type 2 diabetes mellitus ([place]) ?Follows with cardiology.  He is on Crestor 40 mg daily and Vascepa 1 g daily. ? ?Hypertension associated with diabetes ([place]) ?Slightly above goal.  We will continue metoprolol succinate 50 mg daily.  Check labs. ? ?We will check labs today.  ? ?  ?Subjective:  ?HPI: ? ?Patient here for follow-up.  He was last seen about a year and a half ago.  He needs refill on his metformin.  He has been tolerating well without significant side effects.  No frequent urination.  No fatigue.  No lightheadedness.  No significant weight loss.  He has been trying to cut down on carbs which has been modestly successful. ? ?   ?  ?Objective:  ?Physical Exam: ?BP (!) 144/84 (BP Location: Left Arm)   Pulse 76   Temp (!) 97.4 ?F (36.3 ?C) (Temporal)   Ht '5\' 9"'$  (1.753 m)   Wt 248 lb 3.2 oz (112.6 kg)   SpO2 96%   BMI 36.65 kg/m?   ?Gen: No acute distress, resting comfortably ?CV: Regular rate and rhythm with no murmurs appreciated ?Pulm: Normal work of breathing, clear to auscultation bilaterally with no crackles, wheezes, or rhonchi ?Neuro: Grossly normal, moves all extremities ?Psych: Normal affect and thought content ? ?   ? ?[name]. Jerline Pain, MD ?12/06/2021 3:39 PM  ? ?

## 2021-12-06 NOTE — Assessment & Plan Note (Signed)
Slightly above goal.  We will continue metoprolol succinate 50 mg daily.  Check labs. ?

## 2021-12-06 NOTE — Patient Instructions (Addendum)
It was very nice to see you today! ? ?Your A1c is very high. ? ?Please start the [facility].  No other medication changes today. ? ?We will check labs today.  ? ?Send a message in a few weeks limit how this is working. ? ?Please come back in 3 months to recheck your A1c. ? ?Take care, ?Dr Jerline Pain ? ?PLEASE NOTE: ? ?If you had any lab tests please let us know if you have not heard back within a few days. You may see your results on mychart before we have a chance to review them but we will give you a call once they are reviewed by [place]. If we ordered any referrals today, please let us know if you have not heard from their within the next week.  ? ?Please try these tips to maintain a healthy lifestyle: ? ?Eat at least 3 REAL meals and 1-2 snacks per day.  Aim for no more than 5 hours between eating.  If you eat breakfast, please do so within one hour of getting up.  ? ?Each meal should contain half fruits/vegetables, one quarter protein, and one quarter carbs (no bigger than a computer mouse) ? ?Cut down on sweet beverages. This includes juice, soda, and sweet tea.  ? ?Drink at least 1 glass of water with each meal and aim for at least 8 glasses per day ? ?Exercise at least 150 minutes every week.   ?

## 2021-12-06 NOTE — Telephone Encounter (Signed)
(  Key: [id number]) ?Rx #: [id number] ?Mounjaro 2.'5MG'$ /0.5ML pen-injectors ?Waiting for determination  ?

## 2021-12-06 NOTE — Assessment & Plan Note (Signed)
Follows with cardiology.  He is on Crestor 40 mg daily and Vascepa 1 g daily. ?

## 2021-12-07 LAB — COMPREHENSIVE METABOLIC PANEL WITH GFR
ALT: 17 U/L (ref 0–53)
AST: 11 U/L (ref 0–37)
Albumin: 4.4 g/dL (ref 3.5–5.2)
Alkaline Phosphatase: 128 U/L — ABNORMAL HIGH (ref 39–117)
BUN: 21 mg/dL (ref 6–23)
CO2: 27 meq/L (ref 19–32)
Calcium: 9.5 mg/dL (ref 8.4–10.5)
Chloride: 93 meq/L — ABNORMAL LOW (ref 96–112)
Creatinine, Ser: 1.04 mg/dL (ref 0.40–1.50)
GFR: 76.78 mL/min
Glucose, Bld: 372 mg/dL — ABNORMAL HIGH (ref 70–99)
Potassium: 4 meq/L (ref 3.5–5.1)
Sodium: 131 meq/L — ABNORMAL LOW (ref 135–145)
Total Bilirubin: 0.5 mg/dL (ref 0.2–1.2)
Total Protein: 6.9 g/dL (ref 6.0–8.3)

## 2021-12-07 LAB — CBC
HCT: 43.9 % (ref 39.0–52.0)
Hemoglobin: 15.8 g/dL (ref 13.0–17.0)
MCHC: 35.9 g/dL (ref 30.0–36.0)
MCV: 80.8 fl (ref 78.0–100.0)
Platelets: 196 10*3/uL (ref 150.0–400.0)
RBC: 5.44 Mil/uL (ref 4.22–5.81)
RDW: 12.7 % (ref 11.5–15.5)
WBC: 7.6 10*3/uL (ref 4.0–10.5)

## 2021-12-07 LAB — LIPID PANEL
Cholesterol: 363 mg/dL — ABNORMAL HIGH (ref 0–200)
HDL: 33.7 mg/dL — ABNORMAL LOW (ref >39.00)
Total CHOL/HDL Ratio: 11
Triglycerides: 2785 mg/dL — ABNORMAL HIGH (ref 0.0–149.0)

## 2021-12-07 LAB — LDL CHOLESTEROL, DIRECT: Direct LDL: 49 mg/dL

## 2021-12-07 LAB — TSH: TSH: 1.95 u[IU]/mL (ref 0.35–5.50)

## 2021-12-07 NOTE — Progress Notes (Signed)
Please inform patient of the following: ? ?His triglycerides are very high. He should follow up with cardiology soon to discuss treatment plan for his cholesterol levels. His blood sugar is elevated. Everything else is stable. We can recheck again in 3-6 months. ? ?John Hubbard. Jerline Pain, MD ?12/07/2021 1:02 PM  ?

## 2021-12-07 NOTE — Telephone Encounter (Signed)
Rx Darcel Bayley  was not approved  ?Send a renew today  ?

## 2021-12-08 NOTE — Telephone Encounter (Signed)
Denial letter printed and send to be scan in patient chart  ?

## 2021-12-08 NOTE — Telephone Encounter (Signed)
Do they know of any alternatives? ? ?[name]. Jerline Pain, MD ?12/08/2021 9:07 AM  ? ?

## 2021-12-08 NOTE — Telephone Encounter (Signed)
This letter is to inform you that we did not approve your recent Prior Approval request for Mounjaro ?(tirzepatide). ?LVM to patient with denied of medication  ?

## 2022-01-10 ENCOUNTER — Other Ambulatory Visit: Payer: Self-pay | Admitting: Family Medicine

## 2022-01-10 ENCOUNTER — Other Ambulatory Visit: Payer: Self-pay | Admitting: Interventional Cardiology

## 2022-02-18 ENCOUNTER — Inpatient Hospital Stay: Payer: Federal, State, Local not specified - PPO | Admitting: Oncology

## 2022-03-03 ENCOUNTER — Inpatient Hospital Stay: Payer: Federal, State, Local not specified - PPO | Attending: Oncology | Admitting: Oncology

## 2022-03-03 VITALS — BP 145/76 | HR 80 | Temp 98.1°F | Resp 18 | Ht 69.0 in | Wt 247.8 lb

## 2022-03-03 DIAGNOSIS — M109 Gout, unspecified: Secondary | ICD-10-CM | POA: Diagnosis not present

## 2022-03-03 DIAGNOSIS — Z9221 Personal history of antineoplastic chemotherapy: Secondary | ICD-10-CM | POA: Insufficient documentation

## 2022-03-03 DIAGNOSIS — E785 Hyperlipidemia, unspecified: Secondary | ICD-10-CM | POA: Insufficient documentation

## 2022-03-03 DIAGNOSIS — I10 Essential (primary) hypertension: Secondary | ICD-10-CM | POA: Diagnosis not present

## 2022-03-03 DIAGNOSIS — G620 Drug-induced polyneuropathy: Secondary | ICD-10-CM | POA: Insufficient documentation

## 2022-03-03 DIAGNOSIS — T451X5D Adverse effect of antineoplastic and immunosuppressive drugs, subsequent encounter: Secondary | ICD-10-CM | POA: Insufficient documentation

## 2022-03-03 DIAGNOSIS — Z85038 Personal history of other malignant neoplasm of large intestine: Secondary | ICD-10-CM | POA: Diagnosis not present

## 2022-03-03 DIAGNOSIS — C189 Malignant neoplasm of colon, unspecified: Secondary | ICD-10-CM

## 2022-03-03 NOTE — Progress Notes (Signed)
[place] OFFICE PROGRESS NOTE   Diagnosis: Colon cancer  INTERVAL HISTORY:   John Hubbard returns as scheduled.  He feels well.  Good appetite.  He is exercising.  No difficulty with bowel function.  Objective:  Vital signs in last 24 hours:  Blood pressure (!) 145/76, pulse 80, temperature 98.1 F (36.7 C), temperature source Oral, resp. rate 18, height '5\' 9"'  (1.753 m), weight 247 lb 12.8 oz (112.4 kg), SpO2 98 %.  Lymphatics: No cervical, supraclavicular, axillary, or inguinal nodes Resp: Lungs clear bilaterally Cardio: Regular rate and rhythm GI: No mass, no hepatosplenomegaly, nontender Vascular: No leg edema  Skin: Stitch protruding from the mid abdomen   Lab Results:  Lab Results  Component Value Date   WBC 7.6 12/06/2021   HGB 15.8 12/06/2021   HCT 43.9 12/06/2021   MCV 80.8 12/06/2021   PLT 196.0 12/06/2021   NEUTROABS 3.9 10/28/2016    CMP  Lab Results  Component Value Date   NA 131 (L) 12/06/2021   K 4.0 12/06/2021   CL 93 (L) 12/06/2021   CO2 27 12/06/2021   GLUCOSE 372 (H) 12/06/2021   BUN 21 12/06/2021   CREATININE 1.04 12/06/2021   CALCIUM 9.5 12/06/2021   PROT 6.9 12/06/2021   ALBUMIN 4.4 12/06/2021   AST 11 12/06/2021   ALT 17 12/06/2021   ALKPHOS 128 (H) 12/06/2021   BILITOT 0.5 12/06/2021   GFRNONAA >60 04/16/2019   GFRAA >60 04/16/2019    Lab Results  Component Value Date   CEA1 3.75 04/16/2019   CEA 2.1 08/15/2014     Medications: I have reviewed the patient's current medications.   Assessment/Plan: Stage III (T3 N1) poorly differentiated adenocarcinoma of the sigmoid colon status post low anterior resection 08/14/2013. The tumor returned microsatellite stable with no loss of mismatch repair protein expression. Cycle 1 adjuvant CAPOX beginning 09/18/2013.   Cycle 7 CAPOX 01/22/2014   Cycle 8 CAPOX 02/12/2014 (oxaliplatin deleted). Surveillance colonoscopy January 2016 CT scans chest, abdomen and pelvis  08/15/2014 with no evidence of metastatic disease. CT scans 11/15/2016-interval development of mediastinal adenopathy. Enlarging nodule within right upper lobe. No mass or adenopathy identified within the abdomen or pelvis. PET scan 12/05/2016-FDG avid seen in the mediastinum and hila. Nodule in the medial right upper lobe increased since 2015, no abnormal FDG uptake in the region of the nodule. Epicardial node inferior to the liver dome FDG avid. Porta hepatis and portacaval nodes FDG avid as well. Also FDG avid node anterior to the right side of the crus. EUS biopsy of a portacaval node on 12/15/2016-nondiagnostic Bronchoscopy/mediastinoscopy 12/28/2016-biopsies of level 7 and leve l4R nodes negative for malignancy. Changes consistent with a fibrotic lymph node-potentially old granulomas identified on the level 7 node CT chest 04/16/2019-resolution of mediastinal adenopathy, decrease in size of right upper lobe subpleural nodule, morphologic features of cirrhosis Colonoscopy 06/14/2019-negative Gout. He continues allopurinol. History of anxiety maintained on Tegretol. Hypertension. History of delayed nausea. Improved with Aloxi/Emend Rash-the rash occurred following cycle 7 CAPOX. It is possible the rash was related to capecitabine. Improved. 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 Hyperlipidemia    Disposition:  John Hubbard remains in clinical remission from colon cancer.  He would like to continue follow-up at the Cancer center.  He will return for an office visit in 1 year.  He continues colonoscopy surveillance with Dr. Marcello Moores.  Betsy Coder, MD  03/03/2022  3:22 PM

## 2022-04-06 ENCOUNTER — Other Ambulatory Visit: Payer: Self-pay | Admitting: Family Medicine

## 2022-04-06 DIAGNOSIS — M1A9XX0 Chronic gout, unspecified, without tophus (tophi): Secondary | ICD-10-CM

## 2022-04-08 ENCOUNTER — Other Ambulatory Visit: Payer: Self-pay | Admitting: Family Medicine

## 2022-05-02 ENCOUNTER — Other Ambulatory Visit: Payer: Self-pay | Admitting: Family Medicine

## 2022-05-31 ENCOUNTER — Other Ambulatory Visit: Payer: Self-pay | Admitting: Family Medicine

## 2022-06-09 ENCOUNTER — Ambulatory Visit (HOSPITAL_BASED_OUTPATIENT_CLINIC_OR_DEPARTMENT_OTHER)
Admission: RE | Admit: 2022-06-09 | Discharge: 2022-06-09 | Disposition: A | Discharge status: Home / Self Care | Payer: Federal, State, Local not specified - PPO | Source: Ambulatory Visit | Attending: Urgent Care | Admitting: Urgent Care

## 2022-06-09 ENCOUNTER — Telehealth: Payer: Self-pay | Admitting: Urgent Care

## 2022-06-09 ENCOUNTER — Ambulatory Visit
Admission: RE | Admit: 2022-06-09 | Discharge: 2022-06-09 | Disposition: A | Discharge status: Home / Self Care | Payer: Federal, State, Local not specified - PPO | Source: Ambulatory Visit | Attending: Urgent Care | Admitting: Urgent Care

## 2022-06-09 VITALS — BP 125/75 | HR 79 | Temp 98.5°F | Resp 16

## 2022-06-09 DIAGNOSIS — M25571 Pain in right ankle and joints of right foot: Secondary | ICD-10-CM | POA: Diagnosis not present

## 2022-06-09 DIAGNOSIS — S8251XA Displaced fracture of medial malleolus of right tibia, initial encounter for closed fracture: Secondary | ICD-10-CM | POA: Diagnosis not present

## 2022-06-09 DIAGNOSIS — S82892A Other fracture of left lower leg, initial encounter for closed fracture: Secondary | ICD-10-CM

## 2022-06-09 DIAGNOSIS — S93401A Sprain of unspecified ligament of right ankle, initial encounter: Secondary | ICD-10-CM

## 2022-06-09 DIAGNOSIS — M25471 Effusion, right ankle: Secondary | ICD-10-CM | POA: Diagnosis not present

## 2022-06-09 DIAGNOSIS — S82891A Other fracture of right lower leg, initial encounter for closed fracture: Secondary | ICD-10-CM

## 2022-06-09 DIAGNOSIS — M7989 Other specified soft tissue disorders: Secondary | ICD-10-CM | POA: Diagnosis not present

## 2022-06-09 DIAGNOSIS — S82831A Other fracture of upper and lower end of right fibula, initial encounter for closed fracture: Secondary | ICD-10-CM | POA: Diagnosis not present

## 2022-06-09 DIAGNOSIS — X501XXA Overexertion from prolonged static or awkward postures, initial encounter: Secondary | ICD-10-CM | POA: Insufficient documentation

## 2022-06-09 MED ORDER — HYDROCODONE-ACETAMINOPHEN 5-325 MG PO TABS: 1.0000 | ORAL_TABLET | Freq: Four times a day (QID) | ORAL | 0 refills | Status: DC | PRN

## 2022-06-09 NOTE — ED Triage Notes (Signed)
Pt. States that Tuesday he twisted his ankle to the point it looked as if it popped out after slipping on some water. Since Tuesday he has been having some swelling. He has been using ice and OTC medications to manage the swelling and pain.

## 2022-06-09 NOTE — Telephone Encounter (Signed)
Patient returned for bilateral malleolar fracture. Needs pain management, will use hydrocodone. Follow up with ortho.

## 2022-06-09 NOTE — ED Provider Notes (Addendum)
[facility] - URGENT CARE CENTER  Note:  This document was prepared using Systems analyst and may include unintentional dictation errors.  MRN: [id number] DOB: Feb 17, 1961  Subjective:   John Hubbard is a 61 y.o. male presenting for 2-day history of acute onset persistent right ankle pain and swelling from falling and slipping on some water.  Patient has had difficulty bearing weight.  Has had some bruising and swelling.  Is moving around with a walker.  No history of heart disease or MI.  Reports that he had complete heart work-up and was negative.  He is not type II diabetic treated without insulin.  No current facility-administered medications for this encounter.  Current Outpatient Medications:    allopurinol (ZYLOPRIM) 300 MG tablet, TAKE 1 TABLET BY MOUTH EVERY DAY, Disp: 90 tablet, Rfl: 1   aspirin EC 81 MG tablet, Take 1 tablet (81 mg total) by mouth daily. Swallow whole., Disp: 90 tablet, Rfl: 3   carbamazepine (TEGRETOL) 200 MG tablet, TAKE 2 TABLETS (400 MG TOTAL) BY MOUTH AT BEDTIME., Disp: 60 tablet, Rfl: 0   cholecalciferol (VITAMIN D3) 25 MCG (1000 UT) tablet, Take 1,000 Units by mouth daily., Disp: , Rfl:    Coenzyme Q10 (COQ10) 100 MG CAPS, Take 100 mg by mouth daily., Disp: , Rfl:    icosapent Ethyl (VASCEPA) 1 g capsule, Take 1 g by mouth daily., Disp: , Rfl:    metFORMIN (GLUCOPHAGE) 500 MG tablet, TAKE 1 TABLET BY MOUTH 2 TIMES DAILY WITH A MEAL., Disp: 180 tablet, Rfl: 1   metoprolol succinate (TOPROL-XL) 50 MG 24 hr tablet, Take 1 tablet (50 mg total) by mouth daily. TAKE WITH OR IMMEDIATELY FOLLOWING A MEAL., Disp: 90 tablet, Rfl: 3   Multiple Vitamins-Minerals (MULTIVITAMIN WITH MINERALS) tablet, Take 1 tablet by mouth daily., Disp: , Rfl:    nitroGLYCERIN (NITROSTAT) 0.4 MG SL tablet, Place 1 tablet (0.4 mg total) under the tongue every 5 (five) minutes as needed for chest pain., Disp: 25 tablet, Rfl: 3   rosuvastatin (CRESTOR) 40 MG tablet,  TAKE 1 TABLET BY MOUTH EVERY DAY, Disp: 90 tablet, Rfl: 3   tirzepatide (MOUNJARO) 2.5 MG/0.5ML Pen, Inject 2.5 mg into the skin once a week. (Patient not taking: Reported on 03/03/2022), Disp: 2 mL, Rfl: 0   vitamin C (ASCORBIC ACID) 500 MG tablet, Take 500 mg by mouth daily., Disp: , Rfl:    Allergies  Allergen Reactions   Penicillins Hives and Other (See Comments)    Has patient had a PCN reaction causing immediate rash, facial/tongue/throat swelling, SOB or lightheadedness with hypotension: No Has patient had a PCN reaction causing SEVERE RASH INVOLVING MUCUS MEMBRANES or SKIN NECROSIS: #  #  #  YES  #  #  #  Has patient had a PCN reaction that required hospitalization #  #  #  YES  #  #  #  Has patient had a PCN reaction occurring within the last 10 years: No     Past Medical History:  Diagnosis Date   Anxiety    Bipolar disorder ([place])    Colon cancer ([place])    hx colon cancer, surgery, chemotherapy -last 6'26   Complication of anesthesia    woke-up during colonoscopy; difficult to wake   Gout    History of blood transfusion 2015   Neuropathy      Past Surgical History:  Procedure Laterality Date   COLONOSCOPY Bilateral 07/24/2013   Procedure: COLONOSCOPY;  Surgeon: [name],  MD;  Location: [facility];  Service: Endoscopy;  Laterality: Bilateral;   COLONOSCOPY N/A 06/14/2019   Procedure: COLONOSCOPY;  Surgeon: [name], MD;  Location: [facility];  Service: Endoscopy;  Laterality: N/A;   COLONOSCOPY WITH PROPOFOL N/A 10/23/2014   Procedure: COLONOSCOPY WITH PROPOFOL;  Surgeon: [name], MD;  Location: [facility];  Service: Endoscopy;  Laterality: N/A;   EUS N/A 12/15/2016   Procedure: UPPER ENDOSCOPIC ULTRASOUND (EUS) LINEAR;  Surgeon: [name], MD;  Location: [facility];  Service: Endoscopy;  Laterality: N/A;   HERNIA REPAIR  5/88/50   Umbilical Hernia Repair   LAPAROSCOPIC PARTIAL COLECTOMY N/A 08/14/2013   Procedure: LAPAROSCOPIC PARTIAL  COLECTOMY wound vac placement;  Surgeon: [name], MD;  Location: [facility];  Service: General;  Laterality: N/A;   MEDIASTINOSCOPY N/A 12/28/2016   Procedure: MEDIASTINOSCOPY;  Surgeon: [name], MD;  Location: [facility] OR;  Service: Thoracic;  Laterality: N/A;   PORT-A-CATH REMOVAL N/A 05/14/2014   Procedure: REMOVAL PORT-A-CATH;  Surgeon: [name], MD;  Location: [facility];  Service: General;  Laterality: N/A;   PORTACATH PLACEMENT Left 09/16/2013   Procedure: INSERTION PORT-A-CATH;  Surgeon: [name], MD;  Location: [facility];  Service: General;  Laterality: Left;  dressing change on lower abdominal wound   VIDEO BRONCHOSCOPY WITH ENDOBRONCHIAL ULTRASOUND N/A 12/28/2016   Procedure: VIDEO BRONCHOSCOPY WITH ENDOBRONCHIAL ULTRASOUND;  Surgeon: [name], MD;  Location: [facility] OR;  Service: Thoracic;  Laterality: N/A;    Family History  Problem Relation Age of Onset   Heart disease Father     Social History   Tobacco Use   Smoking status: Never   Smokeless tobacco: Never  Substance Use Topics   Alcohol use: Yes    Comment: rare   Drug use: No    ROS   Objective:   Vitals: BP 125/75   Pulse 79   Temp 98.5 F (36.9 C)   Resp 16   SpO2 94%   Physical Exam Constitutional:      General: He is not in acute distress.    Appearance: Normal appearance. He is well-developed and normal weight. He is not ill-appearing, toxic-appearing or diaphoretic.  HENT:     Head: Normocephalic and atraumatic.     Right Ear: External ear normal.     Left Ear: External ear normal.     Nose: Nose normal.     Mouth/Throat:     Pharynx: Oropharynx is clear.  Eyes:     General: No scleral icterus.       Right eye: No discharge.        Left eye: No discharge.     Extraocular Movements: Extraocular movements intact.  Cardiovascular:     Rate and Rhythm: Normal rate.  Pulmonary:     Effort: Pulmonary effort is normal.  Musculoskeletal:     Cervical back:  Normal range of motion.     Right ankle: Swelling present. No deformity, ecchymosis or lacerations. Tenderness present over the lateral malleolus, ATF ligament and AITF ligament. No medial malleolus, CF ligament, posterior TF ligament, base of 5th metatarsal or proximal fibula tenderness. Decreased range of motion.     Right Achilles Tendon: No tenderness or defects. Thompson's test negative.  Neurological:     Mental Status: He is alert and oriented to person, place, and time.  Psychiatric:        Mood and Affect: Mood normal.        Behavior: Behavior normal.  Thought Content: Thought content normal.        Judgment: Judgment normal.    Right ankle wrapped using 4" Ace wrap in figure-8 method.   Assessment and Plan :   PDMP not reviewed this encounter.  1. Sprain of right ankle, unspecified ligament, initial encounter   2. Pain and swelling of right ankle    Will manage for ankle sprain with rice method, NSAID.  Advised that he use ibuprofen at 400 mg to 600 mg once every 6 hours for pain and inflammation. Unfortunately our radiology technologist was pulled to a different site and therefore have to pursue an outpatient x-ray.  We will update treatment plan and diagnoses once we receive the radiology overread from the hospital. Counseled patient on potential for adverse effects with medications prescribed/recommended today, ER and return-to-clinic precautions discussed, patient verbalized understanding.    Jaynee Eagles, [place] 06/09/22 1105  UPDATE:  DG Ankle Complete Right  Result Date: 06/09/2022 CLINICAL DATA:  Trauma, pain EXAM: RIGHT ANKLE - COMPLETE 3+ VIEW COMPARISON:  None Available. FINDINGS: There is oblique fracture in the distal shaft of fibula. There is 2 mm offset in alignment of the lateral cortical margin at the fracture site. There is transverse fracture in the medial malleolus with approximately 2 mm offset in alignment. Ankle mortise is unremarkable. There is soft  tissue swelling around the ankle. IMPRESSION: Comminuted, slightly displaced fractures are seen in the distal shaft of right fibula and medial malleolus. Electronically Signed   By: Elmer Picker M.D.   On: 06/09/2022 14:09    1. Closed fracture of malleolus of both ankles, initial encounter   2. Pain and swelling of right ankle   3. Sprain of right ankle, unspecified ligament, initial encounter    Patient was provided with crutches and advised to be nonweightbearing.  He was placed into a posterior and stirrup splint.  He is to follow-up with an orthopedist as soon as possible.  Provided him with information to multiple practices for this.  I recommended Tylenol and ibuprofen for regular pain control and hydrocodone for breakthrough pain. Counseled patient on potential for adverse effects with medications prescribed/recommended today, ER and return-to-clinic precautions discussed, patient verbalized understanding.   Jaynee Eagles, [place] 06/09/22 1715

## 2022-06-09 NOTE — Discharge Instructions (Addendum)
Since you do not have a history of heart disease or heart attacks it is okay to use ibuprofen '400mg'$ -'600mg'$  once every 6 hours for pain and inflammation. Icing can help by applying it to the ankle over a barrier for 20 minutes. Then take it off for 2 hours.

## 2022-06-14 ENCOUNTER — Ambulatory Visit
Admission: RE | Admit: 2022-06-14 | Discharge: 2022-06-14 | Disposition: A | Discharge status: Home / Self Care | Payer: Federal, State, Local not specified - PPO | Source: Ambulatory Visit | Attending: Orthopaedic Surgery | Admitting: Orthopaedic Surgery

## 2022-06-14 ENCOUNTER — Other Ambulatory Visit: Payer: Self-pay | Admitting: Orthopaedic Surgery

## 2022-06-14 DIAGNOSIS — S82851A Displaced trimalleolar fracture of right lower leg, initial encounter for closed fracture: Secondary | ICD-10-CM | POA: Insufficient documentation

## 2022-06-14 DIAGNOSIS — S82841A Displaced bimalleolar fracture of right lower leg, initial encounter for closed fracture: Secondary | ICD-10-CM

## 2022-06-14 DIAGNOSIS — M79671 Pain in right foot: Secondary | ICD-10-CM | POA: Insufficient documentation

## 2022-06-14 DIAGNOSIS — M7989 Other specified soft tissue disorders: Secondary | ICD-10-CM | POA: Diagnosis not present

## 2022-06-15 ENCOUNTER — Other Ambulatory Visit (HOSPITAL_COMMUNITY): Payer: Self-pay | Admitting: Orthopedic Surgery

## 2022-06-15 ENCOUNTER — Encounter (HOSPITAL_BASED_OUTPATIENT_CLINIC_OR_DEPARTMENT_OTHER): Payer: Self-pay | Admitting: Orthopedic Surgery

## 2022-06-15 ENCOUNTER — Encounter (HOSPITAL_COMMUNITY): Payer: Self-pay | Admitting: Anesthesiology

## 2022-06-15 DIAGNOSIS — S82851A Displaced trimalleolar fracture of right lower leg, initial encounter for closed fracture: Secondary | ICD-10-CM | POA: Diagnosis not present

## 2022-06-15 NOTE — Progress Notes (Signed)
When I called patient for PAT, he states that he has not seen his PCP since March. He states that Darcel Bayley was denied by his insurance and he never started it. He also relates that the Metformin he was to take caused diarrhea and he only takes 1 '500mg'$  tab per day. Patient does not check his sugar at at home.

## 2022-06-15 NOTE — Progress Notes (Signed)
Patient's chart and most recent A1c 14.5 reviewed with Dr Roanna Banning. States surgery can be done at [facility] but if sugar >350 dos, surgery might be cancelled. Megan at Dr [facility] office made aware.

## 2022-06-20 ENCOUNTER — Encounter (HOSPITAL_COMMUNITY): Payer: Self-pay | Admitting: Orthopedic Surgery

## 2022-06-20 NOTE — Anesthesia Preprocedure Evaluation (Deleted)
Anesthesia Evaluation    Reviewed: Allergy & Precautions, Patient's Chart, lab work & pertinent test results, Unable to perform ROS - Chart review only  History of Anesthesia Complications (+) history of anesthetic complications  Airway        Dental   Pulmonary neg pulmonary ROS,           Cardiovascular hypertension, Pt. on home beta blockers   Echo 11/2020 The left ventricular ejection fraction is normal (55-65%). Nuclear stress EF: 57%. No wall motion abnormality There was no ST segment deviation noted during stress. Defect 1: There is a small defect present in the apical inferior and apical lateral location. This apical inferolateral defect is partially reversible, mild ischemia may be present. This is a low risk study.    Neuro/Psych PSYCHIATRIC DISORDERS Anxiety Bipolar Disorder negative neurological ROS     GI/Hepatic negative GI ROS, Neg liver ROS,   Endo/Other  diabetes  Renal/GU negative Renal ROS     Musculoskeletal  (+) Arthritis ,   Abdominal (+) + obese,   Peds  Hematology negative hematology ROS (+)   Anesthesia Other Findings   Reproductive/Obstetrics                            Anesthesia Physical Anesthesia Plan  ASA: 4  Anesthesia Plan: General   Post-op Pain Management: Tylenol PO (pre-op)* and Regional block*   Induction: Intravenous  PONV Risk Score and Plan: 2 and Ondansetron, Dexamethasone, Treatment may vary due to age or medical condition and Midazolam  Airway Management Planned: Oral ETT  Additional Equipment:   Intra-op Plan:   Post-operative Plan: Extubation in OR  Informed Consent:   Plan Discussed with:   Anesthesia Plan Comments: (FSBG 412 in preop. A1c in 14s 2021 and 2023, 11 in 2022. Lipid panel also consistently highly abnormal. Stress MPS in 11/2020 showed area of ischemia. I very firmly counseled the patient that he is extremely high risk  for MI and stroke with diet and DM management as poor as it is. I also emphasized that he is at high risk for infection with surgery which would likely end up in a cascade of further surgery and complications. I recommended that he f/u with his PCP TODAY and get started on new DM meds and check his FSBG at home.)      Anesthesia Quick Evaluation

## 2022-06-20 NOTE — Progress Notes (Signed)
PCP - Dr Dimas Chyle Cardiologist - Dr Daneen Schick Oncology - Dr Betsy Coder  Chest x-ray - n/a EKG - 11/22/21 Stress Test - 11/19/20 ECHO - n/a Cardiac Cath - n/a  ICD Pacemaker/Loop - n/a  Sleep Study -  n/a CPAP - none  Do not take Metformin on the morning of surgery.  Patient does not check his blood sugar.  Aspirin Instructions: Follow your surgeon's instructions on when to stop aspirin prior to surgery,  If no instructions were given by your surgeon then you will need to call the office for those instructions.  Anesthesia review: Yes  STOP now taking any Aspirin (unless otherwise instructed by your surgeon), Aleve, Naproxen, Ibuprofen, Motrin, Advil, Goody's, BC's, all herbal medications, fish oil, and all vitamins.   Coronavirus Screening Do you have any of the following symptoms:  Cough yes/no: No Fever (>100.50F)  yes/no: No Runny nose yes/no: No Sore throat yes/no: No Difficulty breathing/shortness of breath  yes/no: No  Have you traveled in the last 14 days and where? yes/no: No  Patient verbalized understanding of instructions that were given via phone.

## 2022-06-21 ENCOUNTER — Other Ambulatory Visit: Payer: Self-pay | Admitting: Family Medicine

## 2022-06-21 ENCOUNTER — Ambulatory Visit: Payer: Federal, State, Local not specified - PPO | Admitting: Family Medicine

## 2022-06-21 ENCOUNTER — Encounter (HOSPITAL_COMMUNITY)
Admission: RE | Disposition: A | Discharge status: Home / Self Care | Payer: Self-pay | Source: Home / Self Care | Attending: Orthopedic Surgery

## 2022-06-21 ENCOUNTER — Encounter: Payer: Self-pay | Admitting: Family Medicine

## 2022-06-21 ENCOUNTER — Ambulatory Visit (HOSPITAL_COMMUNITY)
Admission: RE | Admit: 2022-06-21 | Discharge: 2022-06-21 | Disposition: A | Discharge status: Home / Self Care | Payer: Federal, State, Local not specified - PPO | Attending: Orthopedic Surgery | Admitting: Orthopedic Surgery

## 2022-06-21 ENCOUNTER — Telehealth: Payer: Self-pay | Admitting: *Deleted

## 2022-06-21 VITALS — BP 136/79 | HR 76 | Temp 97.6°F | Ht 69.0 in

## 2022-06-21 DIAGNOSIS — I152 Hypertension secondary to endocrine disorders: Secondary | ICD-10-CM

## 2022-06-21 DIAGNOSIS — G4730 Sleep apnea, unspecified: Secondary | ICD-10-CM

## 2022-06-21 DIAGNOSIS — Z538 Procedure and treatment not carried out for other reasons: Secondary | ICD-10-CM | POA: Diagnosis not present

## 2022-06-21 DIAGNOSIS — C189 Malignant neoplasm of colon, unspecified: Secondary | ICD-10-CM

## 2022-06-21 DIAGNOSIS — Z794 Long term (current) use of insulin: Secondary | ICD-10-CM | POA: Diagnosis not present

## 2022-06-21 DIAGNOSIS — E1159 Type 2 diabetes mellitus with other circulatory complications: Secondary | ICD-10-CM | POA: Diagnosis not present

## 2022-06-21 DIAGNOSIS — E1165 Type 2 diabetes mellitus with hyperglycemia: Secondary | ICD-10-CM

## 2022-06-21 DIAGNOSIS — Z01818 Encounter for other preprocedural examination: Secondary | ICD-10-CM | POA: Diagnosis not present

## 2022-06-21 DIAGNOSIS — S82851A Displaced trimalleolar fracture of right lower leg, initial encounter for closed fracture: Secondary | ICD-10-CM | POA: Diagnosis not present

## 2022-06-21 HISTORY — DX: Sleep apnea, unspecified: G47.30

## 2022-06-21 HISTORY — DX: Type 2 diabetes mellitus without complications: E11.9

## 2022-06-21 LAB — BASIC METABOLIC PANEL
Anion gap: 10 (ref 5–15)
BUN: 21 mg/dL (ref 8–23)
CO2: 24 mmol/L (ref 22–32)
Calcium: 9.4 mg/dL (ref 8.9–10.3)
Chloride: 100 mmol/L (ref 98–111)
Creatinine, Ser: 0.83 mg/dL (ref 0.61–1.24)
GFR, Estimated: >60 mL/min (ref >60)
Glucose, Bld: 414 mg/dL — ABNORMAL HIGH (ref 70–99)
Potassium: 4.2 mmol/L (ref 3.5–5.1)
Sodium: 134 mmol/L — ABNORMAL LOW (ref 135–145)

## 2022-06-21 LAB — SURGICAL PCR SCREEN
MRSA, PCR: NEGATIVE
Staphylococcus aureus: NEGATIVE

## 2022-06-21 LAB — CBC
HCT: 42.9 % (ref 39.0–52.0)
Hemoglobin: 14.6 g/dL (ref 13.0–17.0)
MCH: 27.5 pg (ref 26.0–34.0)
MCHC: 34 g/dL (ref 30.0–36.0)
MCV: 80.9 fL (ref 80.0–100.0)
Platelets: 241 10*3/uL (ref 150–400)
RBC: 5.3 MIL/uL (ref 4.22–5.81)
RDW: 11.9 % (ref 11.5–15.5)
WBC: 7.2 10*3/uL (ref 4.0–10.5)
nRBC: 0 % (ref 0.0–0.2)

## 2022-06-21 LAB — GLUCOSE, CAPILLARY: Glucose-Capillary: 412 mg/dL — ABNORMAL HIGH (ref 70–99)

## 2022-06-21 LAB — POCT GLYCOSYLATED HEMOGLOBIN (HGB A1C): Hemoglobin A1C: 13.7 % — AB (ref 4.0–5.6)

## 2022-06-21 SURGERY — OPEN REDUCTION INTERNAL FIXATION (ORIF) ANKLE FRACTURE
Anesthesia: General | Site: Ankle | Laterality: Right

## 2022-06-21 MED ORDER — SODIUM CHLORIDE 0.9 % IV SOLN: INTRAVENOUS | Status: DC

## 2022-06-21 MED ORDER — LACTATED RINGERS IV SOLN: INTRAVENOUS | Status: DC

## 2022-06-21 MED ORDER — METFORMIN HCL ER (OSM) 500 MG PO TB24: 1000.0000 mg | ORAL_TABLET | Freq: Two times a day (BID) | ORAL | 3 refills | Status: DC

## 2022-06-21 MED ORDER — BLOOD GLUCOSE MONITOR KIT: PACK | 0 refills | Status: AC

## 2022-06-21 MED ORDER — CHLORHEXIDINE GLUCONATE 0.12 % MT SOLN
15.0000 mL | Freq: Once | OROMUCOSAL | Status: AC
  Administered 2022-06-21: 15 mL via OROMUCOSAL
  Filled 2022-06-21: qty 15

## 2022-06-21 MED ORDER — PROPOFOL 10 MG/ML IV BOLUS
INTRAVENOUS | Status: AC
  Filled 2022-06-21: qty 20

## 2022-06-21 MED ORDER — ORAL CARE MOUTH RINSE: 15.0000 mL | Freq: Once | OROMUCOSAL | Status: AC

## 2022-06-21 MED ORDER — CEFAZOLIN SODIUM-DEXTROSE 2-4 GM/100ML-% IV SOLN
INTRAVENOUS | Status: AC
  Filled 2022-06-21: qty 100

## 2022-06-21 MED ORDER — FENTANYL CITRATE (PF) 250 MCG/5ML IJ SOLN
INTRAMUSCULAR | Status: AC
  Filled 2022-06-21: qty 5

## 2022-06-21 MED ORDER — TIRZEPATIDE 2.5 MG/0.5ML ~~LOC~~ SOAJ: 2.5000 mg | SUBCUTANEOUS | 0 refills | Status: DC

## 2022-06-21 MED ORDER — MIDAZOLAM HCL 2 MG/2ML IJ SOLN
INTRAMUSCULAR | Status: AC
  Filled 2022-06-21: qty 2

## 2022-06-21 MED ORDER — CEFAZOLIN SODIUM-DEXTROSE 2-4 GM/100ML-% IV SOLN: 2.0000 g | INTRAVENOUS | Status: DC

## 2022-06-21 NOTE — Assessment & Plan Note (Signed)
Blood pressure at goal on metoprolol succinate 50 mg daily. 

## 2022-06-21 NOTE — Assessment & Plan Note (Signed)
A1c not controlled 13.7.  Point of care glucose was in the 400s today.  We discussed importance of good glycemic control.  He does not wish to start insulin at this point.  He needs to get his glucoses down into the 300s before he can have his surgery performed.  We will increase his metformin to 1000 mg twice daily and add on Mounjaro.  Patient states he will be able to get this through coupon or through samples.  He will send me a message in a few days via MyChart to let me know how his fasting sugars are looking.  Discussed with patient we may need to add on insulin if sugars are still not at goal over the next several days as above.  We will titrate the dose of Mounjaro as needed over the next several weeks.  He will come back to see in about 3 months for an annual physical and we can recheck A1c at that time.

## 2022-06-21 NOTE — Assessment & Plan Note (Signed)
His colonoscopy later this year.  We will place referral to GI.

## 2022-06-21 NOTE — Progress Notes (Signed)
CBG upon arrival, 412. Per Dr. Lissa Hoard, pt will be cancelled for surgery. Pt advised to visit PCP office immediately for glucose control. OR desk made aware. CRNA aware. Dr. Lissa Hoard to contact Dr. Doran Durand. Pt to d/c to home with mother-in-law. All concerns addressed at this time.   Jacqlyn Larsen, RN

## 2022-06-21 NOTE — Telephone Encounter (Signed)
KeyRenella Cunas - PA Case ID: [id number] - Rx #: [id number] Status Sent to [place] 2.'5MG'$ /0.5ML pen-injectors Waiting for determination

## 2022-06-21 NOTE — Patient Instructions (Signed)
It was very nice to see you today!  Your A1c today is 13.7.  Please increase your metformin to 1000 mg twice daily.  We will also start Mounjaro.  Please send me a message in a few days to let me know how your sugars are looking and we can adjust your medications as needed.  I will refer you for colonoscopy and also for a sleep study.  I will see you back in 3 months for an annual checkup.  We can recheck your A1c at that time.  Take care, Dr Jerline Pain  PLEASE NOTE:  If you had any lab tests please let us know if you have not heard back within a few days. You may see your results on mychart before we have a chance to review them but we will give you a call once they are reviewed by [place]. If we ordered any referrals today, please let us know if you have not heard from their office within the next week.   Please try these tips to maintain a healthy lifestyle:  Eat at least 3 REAL meals and 1-2 snacks per day.  Aim for no more than 5 hours between eating.  If you eat breakfast, please do so within one hour of getting up.   Each meal should contain half fruits/vegetables, one quarter protein, and one quarter carbs (no bigger than a computer mouse)  Cut down on sweet beverages. This includes juice, soda, and sweet tea.   Drink at least 1 glass of water with each meal and aim for at least 8 glasses per day  Exercise at least 150 minutes every week.

## 2022-06-21 NOTE — Progress Notes (Signed)
   John Hubbard is a 61 y.o. male who presents today for an office visit.  Assessment/Plan:  New/Acute Problems: Ankle fracture Continue management per orthopedics.  Will be following up with them later today.  Chronic Problems Addressed Today: T2DM (type 2 diabetes mellitus) ([place]) A1c not controlled 13.7.  Point of care glucose was in the 400s today.  We discussed importance of good glycemic control.  He does not wish to start insulin at this point.  He needs to get his glucoses down into the 300s before he can have his surgery performed.  We will increase his metformin to 1000 mg twice daily and add on Mounjaro.  Patient states he will be able to get this through coupon or through samples.  He will send me a message in a few days via MyChart to let me know how his fasting sugars are looking.  Discussed with patient we may need to add on insulin if sugars are still not at goal over the next several days as above.  We will titrate the dose of Mounjaro as needed over the next several weeks.  He will come back to see in about 3 months for an annual physical and we can recheck A1c at that time.  Colon cancer His colonoscopy later this year.  We will place referral to GI.   Sleep disorder breathing Place referral for sleep studies.  Hypertension associated with diabetes ([place]) Blood pressure at goal on metoprolol succinate 50 mg daily.     Subjective:  HPI:  Patient here for follow up.   We last saw him 6 months ago.  At that time his A1c was very elevated to 14.5.  He deferred starting insulin.  We attempted to add on Mounjaro however insurance would not pay for this.  He was instructed to follow-up within 3 months.  He was prescribed metformin 500 mg twice daily however has been only taking once daily until recently.  He had an accident 2 weeks in which he fractured his right ankle after slipping on water.  He was referred to orthopedics.  Was planning on have surgery today however  this was canceled due to glucose reading of over 400.  He was told that if he can get a sugar less than 350 they would proceed with the surgery.  Following up with his orthopedist later today and is hoping to get his surgery done soon.  See A/P for status of chronic conditions.       Objective:  Physical Exam: BP 136/79   Pulse 76   Temp 97.6 F (36.4 C) (Temporal)   Ht '5\' 9"'$  (1.753 m)   SpO2 95%   BMI 36.77 kg/m   Gen: No acute distress, resting comfortably CV: Regular rate and rhythm with no murmurs appreciated Pulm: Normal work of breathing, clear to auscultation bilaterally with no crackles, wheezes, or rhonchi Neuro: Grossly normal, moves all extremities Psych: Normal affect and thought content  Time Spent: 45 minutes of total time was spent on the date of the encounter performing the following actions: chart review prior to seeing the patient including recent ED visit, obtaining history, performing a medically necessary exam, counseling on the treatment plan, placing orders, and documenting in our EHR.        [name]. Jerline Pain, MD 06/21/2022 12:40 PM

## 2022-06-21 NOTE — Assessment & Plan Note (Signed)
Place referral for sleep studies.

## 2022-06-22 ENCOUNTER — Other Ambulatory Visit: Payer: Self-pay | Admitting: Family Medicine

## 2022-06-22 NOTE — Telephone Encounter (Signed)
Denied on September 19 Your PA request has been denied. Spoke with insurance for appeal, stated patient need to try Rx Trulicity Ozempic or Victosa and fail prier to approved Rx Darcel Bayley  Unable to start an appeal, per insurance patient need to start appeal

## 2022-06-23 ENCOUNTER — Other Ambulatory Visit: Payer: Self-pay | Admitting: *Deleted

## 2022-06-23 ENCOUNTER — Other Ambulatory Visit: Payer: Self-pay | Admitting: Family Medicine

## 2022-06-23 MED ORDER — OZEMPIC (0.25 OR 0.5 MG/DOSE) 2 MG/3ML ~~LOC~~ SOPN: 0.2500 mg | PEN_INJECTOR | SUBCUTANEOUS | 1 refills | Status: DC

## 2022-06-23 NOTE — Telephone Encounter (Signed)
Ok to sen in [employer] 0.'25mg'$  weekly x 4 weeks and then increase to 0.'5mg'$  weekly.  [name]. Jerline Pain, MD 06/23/2022 8:36 AM

## 2022-06-23 NOTE — Telephone Encounter (Signed)
Do they have any suggested alternatives?

## 2022-06-23 NOTE — Telephone Encounter (Signed)
LVM with detail message  [facility]

## 2022-06-24 ENCOUNTER — Encounter: Payer: Self-pay | Admitting: Family Medicine

## 2022-06-24 ENCOUNTER — Telehealth: Payer: Self-pay | Admitting: Internal Medicine

## 2022-06-24 MED ORDER — ICOSAPENT ETHYL 1 G PO CAPS: 1.0000 g | ORAL_CAPSULE | Freq: Every day | ORAL | 1 refills | Status: DC

## 2022-06-24 NOTE — Telephone Encounter (Signed)
Yes he can let them know. His sugar should continue to improve over the next several days.  [name]. Jerline Pain, MD 06/24/2022 12:05 PM

## 2022-06-24 NOTE — Telephone Encounter (Signed)
*  STAT* If patient is at the pharmacy, call can be transferred to refill team.   1. Which medications need to be refilled? (please list name of each medication and dose if known)  icosapent Ethyl (VASCEPA) 1 g capsule  2. Which pharmacy/location (including street and city if local pharmacy) is medication to be sent to?  [id number] [place] [place] [address]. Do they need a 30 day or 90 day supply?  90 day supply  Patient is scheduled for 12/19 with Dr. HDebara Pickett

## 2022-06-24 NOTE — Telephone Encounter (Signed)
Rx request sent to pharmacy.  

## 2022-06-24 NOTE — Telephone Encounter (Signed)
Patient requests to be called at ph#  [phone] to be advised regarding Patient MyChart message about blood sugar levels.

## 2022-06-27 NOTE — Telephone Encounter (Signed)
Spoke with patient, stated He call emergortho let them know his glucose is below preferred numbers for surgery Glucose today is 218 per patient

## 2022-06-27 NOTE — Telephone Encounter (Signed)
Alternative Requested:THE PRESCRIBED MEDICATION IS NOT COVERED BY INSURANCE. PLEASE CONSIDER CHANGING TO ONE OF THE SUGGESTED COVERED ALTERNATIVES OR SUBMITTING FOR A PRIOR AUTHORIZATION.

## 2022-07-03 ENCOUNTER — Other Ambulatory Visit (HOSPITAL_COMMUNITY): Payer: Self-pay | Admitting: Orthopedic Surgery

## 2022-07-06 DIAGNOSIS — M25571 Pain in right ankle and joints of right foot: Secondary | ICD-10-CM | POA: Diagnosis not present

## 2022-07-06 DIAGNOSIS — S82841D Displaced bimalleolar fracture of right lower leg, subsequent encounter for closed fracture with routine healing: Secondary | ICD-10-CM | POA: Diagnosis not present

## 2022-07-07 ENCOUNTER — Ambulatory Visit (HOSPITAL_COMMUNITY)
Admission: RE | Admit: 2022-07-07 | Payer: Federal, State, Local not specified - PPO | Source: Home / Self Care | Admitting: Orthopedic Surgery

## 2022-07-07 ENCOUNTER — Encounter (HOSPITAL_COMMUNITY): Admission: RE | Payer: Self-pay | Source: Home / Self Care

## 2022-07-07 SURGERY — OPEN REDUCTION INTERNAL FIXATION (ORIF) ANKLE FRACTURE
Anesthesia: General | Site: Ankle | Laterality: Right

## 2022-07-27 ENCOUNTER — Telehealth: Payer: Self-pay | Admitting: Family Medicine

## 2022-07-27 NOTE — Telephone Encounter (Signed)
   LAST APPOINTMENT DATE:   06/21/22 OV with PCP   NEXT APPOINTMENT DATE: 10/11/22 OV with PCP   MEDICATION: AccuCheck, Test strips   Is the patient out of medication?  Has about 5 strips left   [id number] [address] [place] [address]: [phone] Fax: [phone] #: [id number]

## 2022-07-28 MED ORDER — ACCU-CHEK GUIDE VI STRP: 1.0000 | ORAL_STRIP | Freq: Every day | 2 refills | Status: DC

## 2022-07-28 NOTE — Telephone Encounter (Signed)
Refill sent.

## 2022-08-10 DIAGNOSIS — S82851A Displaced trimalleolar fracture of right lower leg, initial encounter for closed fracture: Secondary | ICD-10-CM | POA: Diagnosis not present

## 2022-08-10 DIAGNOSIS — S82851D Displaced trimalleolar fracture of right lower leg, subsequent encounter for closed fracture with routine healing: Secondary | ICD-10-CM | POA: Diagnosis not present

## 2022-08-18 ENCOUNTER — Other Ambulatory Visit: Payer: Self-pay | Admitting: Family Medicine

## 2022-09-09 DIAGNOSIS — S82851D Displaced trimalleolar fracture of right lower leg, subsequent encounter for closed fracture with routine healing: Secondary | ICD-10-CM | POA: Diagnosis not present

## 2022-09-14 ENCOUNTER — Other Ambulatory Visit: Payer: Self-pay | Admitting: Internal Medicine

## 2022-09-14 DIAGNOSIS — E781 Pure hyperglyceridemia: Secondary | ICD-10-CM

## 2022-09-20 ENCOUNTER — Ambulatory Visit (HOSPITAL_BASED_OUTPATIENT_CLINIC_OR_DEPARTMENT_OTHER): Payer: Federal, State, Local not specified - PPO | Admitting: Internal Medicine

## 2022-09-28 ENCOUNTER — Other Ambulatory Visit: Payer: Self-pay | Admitting: Family Medicine

## 2022-09-29 NOTE — Telephone Encounter (Signed)
  Encourage patient to contact the pharmacy for refills or they can request refills through [place]:  Please schedule appointment if longer than 1 year  NEXT APPOINTMENT DATE: 10/11/22  MEDICATION:  Semaglutide,0.25 or 0.'5MG'$ /DOS, (OZEMPIC, 0.25 OR 0.5 MG/DOSE,) 2 MG/3ML SOPN    Is the patient out of medication? Yes  [facility]  [id number] [place]: [phone] Fax: [phone]     Let patient know to contact pharmacy at the end of the day to make sure medication is ready.  Please notify patient to allow 48-72 hours to process

## 2022-09-30 NOTE — Telephone Encounter (Signed)
Pt's wife stated they talked to the manufacturer. The pen is not broken. When taken at .05 mg, there are only 4 doses in it. He finished the med with that pen. They are just needing a refill at this point. See previous messages.

## 2022-09-30 NOTE — Telephone Encounter (Signed)
Patient's wife Maudie Mercury states that there are 2 doses left in the Ozempic pen but the pen is broken and will not inject the last 2 doses.  States Patient missed his dosage of Ozempic on 09/28/22 because of Pen Needle is not working and Market researcher for [employer] is closed for the Holiday's.  Requests to be called at ph# [phone] to be advised on what Patient should do.

## 2022-10-01 ENCOUNTER — Other Ambulatory Visit: Payer: Self-pay | Admitting: Family Medicine

## 2022-10-01 DIAGNOSIS — M1A9XX0 Chronic gout, unspecified, without tophus (tophi): Secondary | ICD-10-CM

## 2022-10-04 ENCOUNTER — Telehealth: Payer: Self-pay | Admitting: *Deleted

## 2022-10-04 NOTE — Patient Outreach (Signed)
  Care Coordination   10/04/2022 Name: John Hubbard MRN: [id number] DOB: 1961-04-15   Care Coordination Outreach Attempts:  An unsuccessful telephone outreach was attempted today to offer the patient information about available care coordination services as a benefit of their health plan.   Follow Up Plan:  Additional outreach attempts will be made to offer the patient care coordination information and services.   Encounter Outcome:  No Answer   Care Coordination Interventions:  No, not indicated    Raina Mina, RN Care Management Coordinator [place] Office [phone]

## 2022-10-07 ENCOUNTER — Encounter: Payer: Self-pay | Admitting: Family Medicine

## 2022-10-07 ENCOUNTER — Ambulatory Visit: Payer: Federal, State, Local not specified - PPO | Admitting: Family Medicine

## 2022-10-07 VITALS — BP 125/80 | HR 70 | Temp 97.5°F | Ht 69.0 in | Wt 250.0 lb

## 2022-10-07 DIAGNOSIS — S82851D Displaced trimalleolar fracture of right lower leg, subsequent encounter for closed fracture with routine healing: Secondary | ICD-10-CM | POA: Diagnosis not present

## 2022-10-07 DIAGNOSIS — E1165 Type 2 diabetes mellitus with hyperglycemia: Secondary | ICD-10-CM

## 2022-10-07 DIAGNOSIS — I152 Hypertension secondary to endocrine disorders: Secondary | ICD-10-CM

## 2022-10-07 DIAGNOSIS — E1159 Type 2 diabetes mellitus with other circulatory complications: Secondary | ICD-10-CM | POA: Diagnosis not present

## 2022-10-07 DIAGNOSIS — Z794 Long term (current) use of insulin: Secondary | ICD-10-CM | POA: Diagnosis not present

## 2022-10-07 DIAGNOSIS — G4730 Sleep apnea, unspecified: Secondary | ICD-10-CM | POA: Diagnosis not present

## 2022-10-07 LAB — POCT GLYCOSYLATED HEMOGLOBIN (HGB A1C): Hemoglobin A1C: 7.5 % — AB (ref 4.0–5.6)

## 2022-10-07 MED ORDER — TIRZEPATIDE 7.5 MG/0.5ML ~~LOC~~ SOAJ: 7.5000 mg | SUBCUTANEOUS | 0 refills | Status: DC

## 2022-10-07 NOTE — Patient Instructions (Signed)
It was very nice to see you today!  Your A1c looks much better today.  We will switch you to Mounjaro 7.5 mg daily.  Please send a message in a few weeks limit how this is helping.  Please call to schedule an appointment for your sleep study soon.  Will see back in 3 to 6 months.  Come back sooner if needed.  Take care, Dr Jerline Pain  PLEASE NOTE:  If you had any lab tests, please let us know if you have not heard back within a few days. You may see your results on mychart before we have a chance to review them but we will give you a call once they are reviewed by [place].   If we ordered any referrals today, please let us know if you have not heard from their office within the next week.   If you had any urgent prescriptions sent in today, please check with the pharmacy within an hour of our visit to make sure the prescription was transmitted appropriately.   Please try these tips to maintain a healthy lifestyle:  Eat at least 3 REAL meals and 1-2 snacks per day.  Aim for no more than 5 hours between eating.  If you eat breakfast, please do so within one hour of getting up.   Each meal should contain half fruits/vegetables, one quarter protein, and one quarter carbs (no bigger than a computer mouse)  Cut down on sweet beverages. This includes juice, soda, and sweet tea.   Drink at least 1 glass of water with each meal and aim for at least 8 glasses per day  Exercise at least 150 minutes every week.

## 2022-10-07 NOTE — Assessment & Plan Note (Signed)
A1c improved to 7.0.  He would like to switch to [facility] as he thought it was little more effective than the Ozempic.  Will stop Ozempic and start Mounjaro 7.5 mg weekly.  We discussed potential side effects.  He can continue metformin 1000 mg twice daily.  He will follow-up me in a couple of weeks via MyChart to let me know how he is doing on the medication change.  He will come back in 3 to 6 months to recheck A1c.

## 2022-10-07 NOTE — Assessment & Plan Note (Signed)
Blood pressure at goal today on metoprolol succinate 50 mg daily.

## 2022-10-07 NOTE — Assessment & Plan Note (Signed)
Has been referred for sleep study.  He will call to schedule appointment soon.

## 2022-10-07 NOTE — Progress Notes (Signed)
   HOSEY BURMESTER is a 62 y.o. male who presents today for an office visit.  Assessment/Plan:  Chronic Problems Addressed Today: T2DM (type 2 diabetes mellitus) ([facility]) A1c improved to 7.0.  He would like to switch to [facility] as he thought it was little more effective than the Ozempic.  Will stop Ozempic and start Mounjaro 7.5 mg weekly.  We discussed potential side effects.  He can continue metformin 1000 mg twice daily.  He will follow-up me in a couple of weeks via MyChart to let me know how he is doing on the medication change.  He will come back in 3 to 6 months to recheck A1c.  Hypertension associated with diabetes ([place]) Blood pressure at goal today on metoprolol succinate 50 mg daily.  Sleep disorder breathing Has been referred for sleep study.  He will call to schedule appointment soon.     Subjective:  HPI:  See A/P for status of chronic conditions.  He is here today for diabetes follow-up.  We saw him about 3-1/2 months ago.  A1c at that time was 13.7 with point-of-care glucose in the 400s.  We increased his metformin 1000 mg twice daily and started him on Mounjaro.  Insurance did not approve Mounjaro so we switched him to [employer].  He is now on Ozempic 0.5 mg weekly in addition to the metformin.  He is doing well with this combination.  Occasionally has some belching and burping and some diarrhea but otherwise is tolerating well.  Home sugars are typically in the 130s to 140s.  Does have occasional readings in the 200s.       Objective:  Physical Exam: BP 125/80   Pulse 70   Temp (!) 97.5 F (36.4 C) (Temporal)   Ht '5\' 9"'$  (1.753 m)   Wt 250 lb (113.4 kg)   SpO2 97%   BMI 36.92 kg/m   Wt Readings from Last 3 Encounters:  10/07/22 250 lb (113.4 kg)  06/21/22 249 lb (112.9 kg)  03/03/22 247 lb 12.8 oz (112.4 kg)    Gen: No acute distress, resting comfortably CV: Regular rate and rhythm with no murmurs appreciated Pulm: Normal work of breathing, clear to auscultation  bilaterally with no crackles, wheezes, or rhonchi Neuro: Grossly normal, moves all extremities Psych: Normal affect and thought content      Khani Paino M. Jerline Pain, MD 10/07/2022 11:58 AM

## 2022-10-11 ENCOUNTER — Ambulatory Visit: Payer: Federal, State, Local not specified - PPO | Admitting: Family Medicine

## 2022-10-21 ENCOUNTER — Encounter: Payer: Self-pay | Admitting: *Deleted

## 2022-10-21 DIAGNOSIS — S82851D Displaced trimalleolar fracture of right lower leg, subsequent encounter for closed fracture with routine healing: Secondary | ICD-10-CM | POA: Diagnosis not present

## 2022-10-24 ENCOUNTER — Encounter: Payer: Self-pay | Admitting: Family

## 2022-10-24 ENCOUNTER — Ambulatory Visit: Payer: Federal, State, Local not specified - PPO | Admitting: Family

## 2022-10-24 VITALS — BP 138/81 | HR 79 | Temp 98.3°F | Ht 69.0 in | Wt 248.5 lb

## 2022-10-24 DIAGNOSIS — R1033 Periumbilical pain: Secondary | ICD-10-CM | POA: Diagnosis not present

## 2022-10-24 NOTE — Patient Instructions (Signed)
It was very nice to see you today!   As discussed, you can try OTC generic Gas-X to help with stomach pain related to extra air in the belly. Avoid gas producing foods:  onions, beans (not green beans), eggs, other dairy products, cabbage, broccoli, etc.  And avoid drinking through a straw.  Let us know if your symptoms are not improving!      PLEASE NOTE:  If you had any lab tests please let us know if you have not heard back within a few days. You may see your results on MyChart before we have a chance to review them but we will give you a call once they are reviewed by [place]. If we ordered any referrals today, please let us know if you have not heard from their office within the next week.

## 2022-10-24 NOTE — Progress Notes (Signed)
Patient ID: John Hubbard, male    DOB: August 23, 1961, 62 y.o.   MRN: [id number]  Chief Complaint  Patient presents with   Abdominal Pain    Pt c/o lower abdominal pain in left side, Present for about a week. Has tried tylenol which does help, unsure of an injury.      HPI:      Abd pain:  LLQ, started on Friday as severe and made this appt, but each day has gotten a little better. He reports he is taking Mounjaro for DM, having soft BM daily, last week reports diarrhea, denies nausea, felt sharp stabbing pain, on left lower side, denies change in diet, had colon cancer w/colectomy 10 years ago, surgical hernia after colectomy in 2015 - next colonoscopy in spring. Denies any fever, can reproduce pain with side bends and twisting to his left. Reports he has been belching frequently last couple of days.   Assessment & Plan:  1. Periumbilical abdominal pain - per assessment, pt has guarding w/ left and distal of umbilicus. No mass palpable. Believe mostly gas related. Advised on trying generic OTC Gas X per box instructions, avoid gas producing foods. Continue 2L water intake qd, be sure bowels are moving daily, monitor if better but pains return, may need prophylactic, as taking Mounjaro & may be contributing to sx. Advised to call back if pain does not resolve, develops other sx including nausea or constipation, or any fever.   Subjective:    Outpatient Medications Prior to Visit  Medication Sig Dispense Refill   ACCU-CHEK GUIDE test strip 1 each by Other route daily. Dx E11.9 100 each 2   allopurinol (ZYLOPRIM) 300 MG tablet TAKE 1 TABLET BY MOUTH EVERY DAY 90 tablet 1   aspirin EC 81 MG tablet Take 1 tablet (81 mg total) by mouth daily. Swallow whole. 90 tablet 3   blood glucose meter kit and supplies KIT Dispense based on patient and insurance preference. Use up to four times daily as directed. 1 each 0   carbamazepine (TEGRETOL) 200 MG tablet TAKE 2 TABLETS BY MOUTH AT BEDTIME. 180 tablet 1    cholecalciferol (VITAMIN D3) 25 MCG (1000 UT) tablet Take 1,000 Units by mouth daily.     Coenzyme Q10 (COQ10) 100 MG CAPS Take 100 mg by mouth daily.     HYDROcodone-acetaminophen (NORCO/VICODIN) 5-325 MG tablet Take 1 tablet by mouth every 6 (six) hours as needed for severe pain. 15 tablet 0   icosapent Ethyl (VASCEPA) 1 g capsule Take 1 capsule (1 g total) by mouth daily. 90 capsule 1   metFORMIN (GLUCOPHAGE-XR) 500 MG 24 hr tablet Take 2 tablets (1,000 mg total) by mouth 2 (two) times daily with a meal. 360 tablet 3   metoprolol succinate (TOPROL-XL) 50 MG 24 hr tablet Take 1 tablet (50 mg total) by mouth daily. TAKE WITH OR IMMEDIATELY FOLLOWING A MEAL. 90 tablet 3   Multiple Vitamins-Minerals (MULTIVITAMIN WITH MINERALS) tablet Take 1 tablet by mouth daily.     rosuvastatin (CRESTOR) 40 MG tablet TAKE 1 TABLET BY MOUTH EVERY DAY 90 tablet 3   tirzepatide (MOUNJARO) 7.5 MG/0.5ML Pen Inject 7.5 mg into the skin once a week. 6 mL 0   vitamin C (ASCORBIC ACID) 500 MG tablet Take 500 mg by mouth daily.     No facility-administered medications prior to visit.   Past Medical History:  Diagnosis Date   Anxiety    Bipolar disorder ([place])    Colon cancer ([place])  hx colon cancer, surgery, chemotherapy -last 0'99   Complication of anesthesia    woke-up during colonoscopy; difficult to wake   Diabetes mellitus without complication ([place])    type   Gout    History of blood transfusion 2015   Hypertension    Neuropathy    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/21/2022   Past Surgical History:  Procedure Laterality Date   COLONOSCOPY Bilateral 07/24/2013   Procedure: COLONOSCOPY;  Surgeon: [name], MD;  Location: [facility];  Service: Endoscopy;  Laterality: Bilateral;   COLONOSCOPY N/A 06/14/2019   Procedure: COLONOSCOPY;  Surgeon: [name], MD;  Location: [facility];  Service: Endoscopy;  Laterality: N/A;   COLONOSCOPY WITH PROPOFOL N/A 10/23/2014   Procedure: COLONOSCOPY WITH  PROPOFOL;  Surgeon: [name], MD;  Location: [facility];  Service: Endoscopy;  Laterality: N/A;   EUS N/A 12/15/2016   Procedure: UPPER ENDOSCOPIC ULTRASOUND (EUS) LINEAR;  Surgeon: [name], MD;  Location: [facility];  Service: Endoscopy;  Laterality: N/A;   HERNIA REPAIR  8/33/82   Umbilical Hernia Repair   LAPAROSCOPIC PARTIAL COLECTOMY N/A 08/14/2013   Procedure: LAPAROSCOPIC PARTIAL COLECTOMY wound vac placement;  Surgeon: [name], MD;  Location: [facility];  Service: General;  Laterality: N/A;   MEDIASTINOSCOPY N/A 12/28/2016   Procedure: MEDIASTINOSCOPY;  Surgeon: [name], MD;  Location: [place];  Service: Thoracic;  Laterality: N/A;   PORT-A-CATH REMOVAL N/A 05/14/2014   Procedure: REMOVAL PORT-A-CATH;  Surgeon: [name], MD;  Location: [facility];  Service: General;  Laterality: N/A;   PORTACATH PLACEMENT Left 09/16/2013   Procedure: INSERTION PORT-A-CATH;  Surgeon: [name], MD;  Location: [place];  Service: General;  Laterality: Left;  dressing change on lower abdominal wound   VIDEO BRONCHOSCOPY WITH ENDOBRONCHIAL ULTRASOUND N/A 12/28/2016   Procedure: VIDEO BRONCHOSCOPY WITH ENDOBRONCHIAL ULTRASOUND;  Surgeon: [name], MD;  Location: [facility];  Service: Thoracic;  Laterality: N/A;   Allergies  Allergen Reactions   Penicillins Hives and Other (See Comments)    Has patient had a PCN reaction causing immediate rash, facial/tongue/throat swelling, SOB or lightheadedness with hypotension: No Has patient had a PCN reaction causing SEVERE RASH INVOLVING MUCUS MEMBRANES or SKIN NECROSIS: #  #  #  YES  #  #  #  Has patient had a PCN reaction that required hospitalization #  #  #  YES  #  #  #  Has patient had a PCN reaction occurring within the last 10 years: No       Objective:    Physical Exam Vitals and nursing note reviewed.  Constitutional:      General: He is not in acute distress.    Appearance: Normal appearance. He is  obese.  HENT:     Head: Normocephalic.  Cardiovascular:     Rate and Rhythm: Normal rate and regular rhythm.  Pulmonary:     Effort: Pulmonary effort is normal.     Breath sounds: Normal breath sounds.  Abdominal:     General: Abdomen is protuberant. There is no distension.     Palpations: Abdomen is soft.     Tenderness: There is abdominal tenderness in the periumbilical area and left lower quadrant. There is guarding. Negative signs include Murphy's sign and McBurney's sign.     Hernia: No hernia is present.  Musculoskeletal:        General: Normal range of motion.     Cervical back: Normal range of motion.  Skin:  General: Skin is warm and dry.  Neurological:     Mental Status: He is alert and oriented to person, place, and time.  Psychiatric:        Mood and Affect: Mood normal.    BP 138/81 (BP Location: Left Arm, Patient Position: Sitting, Cuff Size: Large)   Pulse 79   Temp 98.3 F (36.8 C) (Temporal)   Ht '5\' 9"'$  (1.753 m)   Wt 248 lb 8 oz (112.7 kg)   SpO2 94%   BMI 36.70 kg/m  Wt Readings from Last 3 Encounters:  10/24/22 248 lb 8 oz (112.7 kg)  10/07/22 250 lb (113.4 kg)  06/21/22 249 lb (112.9 kg)       Jeanie Sewer, NP

## 2022-11-08 DIAGNOSIS — S82851D Displaced trimalleolar fracture of right lower leg, subsequent encounter for closed fracture with routine healing: Secondary | ICD-10-CM | POA: Diagnosis not present

## 2022-11-10 ENCOUNTER — Ambulatory Visit: Payer: Federal, State, Local not specified - PPO | Attending: Internal Medicine | Admitting: Internal Medicine

## 2022-11-10 ENCOUNTER — Encounter: Payer: Self-pay | Admitting: Internal Medicine

## 2022-11-10 VITALS — BP 132/76 | HR 78 | Ht 70.0 in | Wt 240.6 lb

## 2022-11-10 DIAGNOSIS — E781 Pure hyperglyceridemia: Secondary | ICD-10-CM | POA: Diagnosis not present

## 2022-11-10 DIAGNOSIS — R5383 Other fatigue: Secondary | ICD-10-CM | POA: Diagnosis not present

## 2022-11-10 DIAGNOSIS — E119 Type 2 diabetes mellitus without complications: Secondary | ICD-10-CM

## 2022-11-10 DIAGNOSIS — E785 Hyperlipidemia, unspecified: Secondary | ICD-10-CM

## 2022-11-10 DIAGNOSIS — I10 Essential (primary) hypertension: Secondary | ICD-10-CM

## 2022-11-10 LAB — HEPATIC FUNCTION PANEL
ALT: 25 IU/L (ref 0–44)
AST: 17 IU/L (ref 0–40)
Albumin: 4.8 g/dL (ref 3.9–4.9)
Alkaline Phosphatase: 109 IU/L (ref 44–121)
Bilirubin Total: 0.2 mg/dL (ref 0.0–1.2)
Bilirubin, Direct: 0.1 mg/dL (ref 0.00–0.40)
Total Protein: 6.8 g/dL (ref 6.0–8.5)

## 2022-11-10 LAB — LIPID PANEL
Chol/HDL Ratio: 4.2 ratio (ref 0.0–5.0)
Cholesterol, Total: 113 mg/dL (ref 100–199)
HDL: 27 mg/dL — ABNORMAL LOW (ref >39)
LDL Chol Calc (NIH): 51 mg/dL (ref 0–99)
Triglycerides: 217 mg/dL — ABNORMAL HIGH (ref 0–149)
VLDL Cholesterol Cal: 35 mg/dL (ref 5–40)

## 2022-11-10 MED ORDER — METOPROLOL SUCCINATE ER 25 MG PO TB24: 25.0000 mg | ORAL_TABLET | Freq: Every day | ORAL | 3 refills | Status: DC

## 2022-11-10 NOTE — Progress Notes (Signed)
Cardiology Office Note:    Date:  11/10/2022   ID:  John Hubbard, DOB [birth year], MRN [id number]  PCP:  [name], MD   [place] Providers Cardiologist:  Werner Lean, MD     Referring MD: [name], MD   CC: Transition to new cardiologist  History of Present Illness:    John Hubbard is a 62 y.o. male with a hx of HTN with DM, Colon Cancer in 2014 s/p chemotherapy (CAPOX with no MI on therapy), FH, morbid obesity seen by Dr. Tamala Julian in 2023.  Sees saw Dr. Debara Pickett for [place] mgmt.  Has f/u in in March with Dr. Debara Pickett.  Patient notes that he is doing well.   Since last visit notes that he has lost weight and the [place] has helped . He is feeling a lot better. Had a mechanical fall, slipped, and just got off a boot and walking for his R ankle.  This has slowed down his activity.  No chest pain or pressure .  No SOB/DOE and no PND/Orthopnea.  No weight gain or leg swelling.  No palpitations or syncope.    Ambulatory BP is not done.  Past Medical History:  Diagnosis Date   Anxiety    Bipolar disorder ([place])    Colon cancer ([place])    hx colon cancer, surgery, chemotherapy -last 6'96   Complication of anesthesia    woke-up during colonoscopy; difficult to wake   Diabetes mellitus without complication ([place])    type   Gout    History of blood transfusion 2015   Hypertension    Neuropathy    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/21/2022    Past Surgical History:  Procedure Laterality Date   COLONOSCOPY Bilateral 07/24/2013   Procedure: COLONOSCOPY;  Surgeon: [name], MD;  [facility];  Service: Endoscopy;  Laterality: Bilateral;   COLONOSCOPY N/A 06/14/2019   Procedure: COLONOSCOPY;  Surgeon: [name], MD;  [facility];  Service: Endoscopy;  Laterality: N/A;   COLONOSCOPY WITH PROPOFOL N/A 10/23/2014   Procedure: COLONOSCOPY WITH PROPOFOL;  Surgeon: [name], MD;  [facility];  Service: Endoscopy;  Laterality:  N/A;   EUS N/A 12/15/2016   Procedure: UPPER ENDOSCOPIC ULTRASOUND (EUS) LINEAR;  Surgeon: [name], MD;  [facility];  Service: Endoscopy;  Laterality: N/A;   HERNIA REPAIR  2/95/28   Umbilical Hernia Repair   LAPAROSCOPIC PARTIAL COLECTOMY N/A 08/14/2013   Procedure: LAPAROSCOPIC PARTIAL COLECTOMY wound vac placement;  Surgeon: [name], MD;  [facility];  Service: General;  Laterality: N/A;   MEDIASTINOSCOPY N/A 12/28/2016   Procedure: MEDIASTINOSCOPY;  Surgeon: [name], MD;  Location: [place];  Service: Thoracic;  Laterality: N/A;   PORT-A-CATH REMOVAL N/A 05/14/2014   Procedure: REMOVAL PORT-A-CATH;  Surgeon: [name], MD;  [facility];  Service: General;  Laterality: N/A;   PORTACATH PLACEMENT Left 09/16/2013   Procedure: INSERTION PORT-A-CATH;  Surgeon: [name], MD;  Location: [place];  Service: General;  Laterality: Left;  dressing change on lower abdominal wound   VIDEO BRONCHOSCOPY WITH ENDOBRONCHIAL ULTRASOUND N/A 12/28/2016   Procedure: VIDEO BRONCHOSCOPY WITH ENDOBRONCHIAL ULTRASOUND;  Surgeon: [name], MD;  [facility];  Service: Thoracic;  Laterality: N/A;    Current Medications: Current Meds  Medication Sig   ACCU-CHEK GUIDE test strip 1 each by Other route daily. Dx E11.9   allopurinol (ZYLOPRIM) 300 MG tablet TAKE 1 TABLET BY MOUTH  EVERY DAY   aspirin EC 81 MG tablet Take 1 tablet (81 mg total) by mouth daily. Swallow whole.   blood glucose meter kit and supplies KIT Dispense based on patient and insurance preference. Use up to four times daily as directed.   carbamazepine (TEGRETOL) 200 MG tablet TAKE 2 TABLETS BY MOUTH AT BEDTIME.   cholecalciferol (VITAMIN D3) 25 MCG (1000 UT) tablet Take 1,000 Units by mouth daily.   Coenzyme Q10 (COQ10) 100 MG CAPS Take 100 mg by mouth daily.   HYDROcodone-acetaminophen (NORCO/VICODIN) 5-325 MG tablet Take 1 tablet by mouth every 6 (six) hours as needed  for severe pain.   icosapent Ethyl (VASCEPA) 1 g capsule Take 1 capsule (1 g total) by mouth daily.   metFORMIN (GLUCOPHAGE-XR) 500 MG 24 hr tablet Take 2 tablets (1,000 mg total) by mouth 2 (two) times daily with a meal.   metoprolol succinate (TOPROL XL) 25 MG 24 hr tablet Take 1 tablet (25 mg total) by mouth at bedtime.   Multiple Vitamins-Minerals (MULTIVITAMIN WITH MINERALS) tablet Take 1 tablet by mouth daily.   rosuvastatin (CRESTOR) 40 MG tablet TAKE 1 TABLET BY MOUTH EVERY DAY   tirzepatide (MOUNJARO) 7.5 MG/0.5ML Pen Inject 7.5 mg into the skin once a week.   vitamin C (ASCORBIC ACID) 500 MG tablet Take 500 mg by mouth daily.   [DISCONTINUED] metoprolol succinate (TOPROL-XL) 50 MG 24 hr tablet Take 1 tablet (50 mg total) by mouth daily. TAKE WITH OR IMMEDIATELY FOLLOWING A MEAL.     Allergies:   Penicillins   Social History   Socioeconomic History   Marital status: Married    Spouse name: Not on file   Number of children: Not on file   Years of education: Not on file   Highest education level: Not on file  Occupational History   Not on file  Tobacco Use   Smoking status: Never   Smokeless tobacco: Never  Vaping Use   Vaping Use: Never used  Substance and Sexual Activity   Alcohol use: Yes    Comment: occasional 1/wk   Drug use: No   Sexual activity: Yes  Other Topics Concern   Not on file  Social History Narrative   Not on file   Social Determinants of Health   Financial Resource Strain: Not on file  Food Insecurity: Not on file  Transportation Needs: Not on file  Physical Activity: Not on file  Stress: Not on file  Social Connections: Not on file    Social: Wife comes to visit  Family History: The patient's family history includes Heart disease in his father.  ROS:   Please see the history of present illness.     All other systems reviewed and are negative.  EKGs/Labs/Other Studies Reviewed:    The following studies were reviewed today:   EKG:   EKG is  ordered today.  The ekg ordered today demonstrates  11/10/22: SR with inferior infarct pattern and 1st HB  Cardiac Studies & Procedures     STRESS TESTS  MYOCARDIAL PERFUSION IMAGING 11/19/2020  Narrative  The left ventricular ejection fraction is normal (55-65%).  Nuclear stress EF: 57%. No wall motion abnormality  There was no ST segment deviation noted during stress.  Defect 1: There is a small defect present in the apical inferior and apical lateral location. This apical inferolateral defect is partially reversible, mild ischemia may be present.  This is a low risk study.  Candee Furbish, MD  Recent Labs: 12/06/2021: ALT 17; TSH 1.95 06/21/2022: BUN 21; Creatinine, Ser 0.83; Hemoglobin 14.6; Platelets 241; Potassium 4.2; Sodium 134  Recent Lipid Panel    Component Value Date/Time   CHOL 363 (H) 12/06/2021 1532   CHOL 205 (H) 12/17/2020 1245   TRIG (H) 12/06/2021 1532    2785.0 Triglyceride is over 400; calculations on Lipids are invalid.   HDL 33.70 (L) 12/06/2021 1532   HDL 27 (L) 12/17/2020 1245   CHOLHDL 11 12/06/2021 1532   LDLCALC Comment (A) 12/17/2020 1245   LDLCALC  09/17/2020 1543     Comment:     . LDL cholesterol not calculated. Triglyceride levels greater than 400 mg/dL invalidate calculated LDL results. . Reference range: <100 . Desirable range <100 mg/dL for primary prevention;   <70 mg/dL for patients with CHD or diabetic patients  with > or = 2 CHD risk factors. Marland Kitchen LDL-C is now calculated using the Martin-Hopkins  calculation, which is a validated novel method providing  better accuracy than the Friedewald equation in the  estimation of LDL-C.  Cresenciano Genre et al. Annamaria Helling. 4481;856(31): 2061-2068  (http://education.Quest[employer].com/faq/FAQ164)    LDLDIRECT 49.0 12/06/2021 1532        Physical Exam:    VS:  BP 132/76   Pulse 78   Ht '5\' 10"'$  (1.778 m)   Wt 240 lb 9.6 oz (109.1 kg)   SpO2 97%   BMI 34.52 kg/m     Wt  Readings from Last 3 Encounters:  11/10/22 240 lb 9.6 oz (109.1 kg)  10/24/22 248 lb 8 oz (112.7 kg)  10/07/22 250 lb (113.4 kg)    GEN:  Well nourished, well developed in no acute distress HEENT: Normal NECK: No JVD CARDIAC: RRR, no murmurs, rubs, gallops RESPIRATORY:  Clear to auscultation without rales, wheezing or rhonchi  ABDOMEN: Soft, non-tender, non-distended MUSCULOSKELETAL:  No edema; No deformity  SKIN: Warm and dry NEUROLOGIC:  Alert and oriented x 3 PSYCHIATRIC:  Normal affect   ASSESSMENT:    1. Hyperlipidemia, unspecified hyperlipidemia type   2. Hypertriglyceridemia   3. Diabetes mellitus with coincident hypertension (HCC)   4. 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 Debara Pickett - discussed diet interventions focused on sweet consumption      One year me or APP   Medication Adjustments/Labs and Tests Ordered: Current medicines are reviewed at length with the patient today.  Concerns regarding medicines are outlined above.  Orders Placed This Encounter  Procedures   Lipid panel   Hepatic function panel   EKG 12-Lead   Meds ordered this encounter  Medications   metoprolol succinate (TOPROL XL) 25 MG 24 hr tablet    Sig: Take 1 tablet (25 mg total) by mouth at bedtime.    Dispense:  90 tablet    Refill:  3    Patient Instructions  Medication Instructions:  Your physician has recommended you make the following change in your medication:  Decrease Toprol XL to '25mg'$  daily *If you need a refill on your cardiac medications before your next appointment, please call your pharmacy*   Lab Work:  Lipids, LFT If you have  labs (blood work) drawn today and your tests are completely normal, you will receive your results only by: [place] (if you have MyChart) OR A paper copy in the mail If you have any lab test that is abnormal or we need to change your treatment, we will call you to review the results.  Follow-Up: At [facility], you and your health needs are our priority.  As part of our continuing mission to provide you with exceptional heart care, we have created designated Provider Care Teams.  These Care Teams include your primary Cardiologist (physician) and Advanced Practice Providers (APPs -  Physician Assistants and Nurse Practitioners) who all work together to provide you with the care you need, when you need it.    Your next appointment:   1 year(s)  Provider:   Werner Lean, MD       Signed, Werner Lean, MD  11/10/2022 9:41 AM    [place]

## 2022-11-10 NOTE — Patient Instructions (Addendum)
Medication Instructions:  Your physician has recommended you make the following change in your medication:  Decrease Toprol XL to '25mg'$  daily *If you need a refill on your cardiac medications before your next appointment, please call your pharmacy*   Lab Work: Lipids, LFT If you have labs (blood work) drawn today and your tests are completely normal, you will receive your results only by: [place] (if you have MyChart) OR A paper copy in the mail If you have any lab test that is abnormal or we need to change your treatment, we will call you to review the results.  Follow-Up: At [facility], you and your health needs are our priority.  As part of our continuing mission to provide you with exceptional heart care, we have created designated Provider Care Teams.  These Care Teams include your primary Cardiologist (physician) and Advanced Practice Providers (APPs -  Physician Assistants and Nurse Practitioners) who all work together to provide you with the care you need, when you need it.    Your next appointment:   1 year(s)  Provider:   Werner Lean, MD

## 2022-11-15 DIAGNOSIS — S82851D Displaced trimalleolar fracture of right lower leg, subsequent encounter for closed fracture with routine healing: Secondary | ICD-10-CM | POA: Diagnosis not present

## 2022-11-22 DIAGNOSIS — S82851D Displaced trimalleolar fracture of right lower leg, subsequent encounter for closed fracture with routine healing: Secondary | ICD-10-CM | POA: Diagnosis not present

## 2022-11-24 DIAGNOSIS — M79671 Pain in right foot: Secondary | ICD-10-CM | POA: Diagnosis not present

## 2022-11-29 ENCOUNTER — Encounter (HOSPITAL_BASED_OUTPATIENT_CLINIC_OR_DEPARTMENT_OTHER): Payer: Self-pay | Admitting: Internal Medicine

## 2022-11-29 ENCOUNTER — Ambulatory Visit (HOSPITAL_BASED_OUTPATIENT_CLINIC_OR_DEPARTMENT_OTHER): Payer: Federal, State, Local not specified - PPO | Admitting: Internal Medicine

## 2022-11-29 VITALS — BP 132/77 | HR 80 | Ht 70.0 in | Wt 242.0 lb

## 2022-11-29 DIAGNOSIS — E781 Pure hyperglyceridemia: Secondary | ICD-10-CM

## 2022-11-29 DIAGNOSIS — E785 Hyperlipidemia, unspecified: Secondary | ICD-10-CM

## 2022-11-29 DIAGNOSIS — E119 Type 2 diabetes mellitus without complications: Secondary | ICD-10-CM | POA: Diagnosis not present

## 2022-11-29 DIAGNOSIS — I10 Essential (primary) hypertension: Secondary | ICD-10-CM

## 2022-11-29 NOTE — Progress Notes (Signed)
LIPID CLINIC CONSULT NOTE  Chief Complaint:  Follow-up dyslipidemia  Primary Care Physician: [name], MD  Primary Cardiologist:  Werner Lean, MD  HPI:  John Hubbard is a 62 y.o. male who is being seen today for the evaluation of dyslipidemia at the request of [name], MD. This is a pleasant gentleman kindly referred by Dr. Tamala Julian for evaluation and management of dyslipidemia.  He was noted to have significantly elevated cholesterol, primarily driven by triglycerides.  His lipid profile from 5 months ago showed total cholesterol 553, triglycerides 3000 204, HDL 13 and LDL was not calculated.  Subsequently he had been started on 40 mg of rosuvastatin however repeat labs just a month after that showed already a marked improvement in his lipids with a decrease in his triglycerides from 3204 to 861.  This was also probably due to poorly controlled diabetes.  His hemoglobin A1c was 14.1 and came down to 11.2 with some therapy.  I discussed that further with him today.  He says he does not have a glucometer or regularly check his blood sugars.  He is only on metformin at this time.  He has made some dietary changes.  Primarily he is looking to reduce carbohydrates but was not aware that he do need to watch saturated fats in his diet.  He has no known history of coronary disease although had recent stress testing.  There is family history of heart disease and elevated triglycerides in his father and his brother.  Unfortunately, LDL is not known because of his high triglycerides a direct LDL was not measured.  08/05/2021  John Hubbard returns today for follow-up.  He unfortunately was not able to get his labs drawn before this visit.  He has been trying to work on his diet and some weight loss.  Particularly his been cutting back carbohydrates.  His blood sugars were elevated with his last A1c just over 11.  He has not had a repeat to my knowledge.  He is on metformin.  He may  need additional therapy.  He was not able to tolerate 2 g of Vascepa due to some stomach upset.  He cut that back down to 1 g a day, but was confused that the actual dose should be 2 g twice a day and therefore he is getting about a quarter of the amount of Vascepa he should be.  We discussed this and he is willing to try to take an additional capsule at night.  I have also encouraged more physical activity if he is able to do that because this will continue to help with his numbers.  11/29/2022  John Hubbard is seen today in follow-up.  His visits have been virtual up until this point.  In the fall he had fractured his ankle and was contemplating surgery however his bones had aligned with casting and he elected to allow it to heal normally.  He has done well with that but he is also made significant changes in diet as well as medical therapy which has profoundly improved his numbers.  His triglycerides have come down by a factor of 10 from 2785-217.  Hemoglobin A1c is also down significantly from 13.7% to 7.5%.  Liver enzymes are normal.  Total cholesterol now 113, triglycerides as mentioned 217, HDL 27 and LDL 51.  He reports he is only taking 2 g of Vascepa per day plus the statin but also takes an additional 1 g over-the-counter fish  oil.  PMHx:  Past Medical History:  Diagnosis Date   Anxiety    Bipolar disorder ([place])    Colon cancer ([place])    hx colon cancer, surgery, chemotherapy -last [id number]   Complication of anesthesia    woke-up during colonoscopy; difficult to wake   Diabetes mellitus without complication ([place])    type   Gout    History of blood transfusion 2015   Hypertension    Neuropathy    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/21/2022    Past Surgical History:  Procedure Laterality Date   COLONOSCOPY Bilateral 07/24/2013   Procedure: COLONOSCOPY;  Surgeon: [name], MD;  [facility];  Service: Endoscopy;  Laterality: Bilateral;   COLONOSCOPY N/A 06/14/2019   Procedure:  COLONOSCOPY;  Surgeon: [name], MD;  [facility];  Service: Endoscopy;  Laterality: N/A;   COLONOSCOPY WITH PROPOFOL N/A 10/23/2014   Procedure: COLONOSCOPY WITH PROPOFOL;  Surgeon: [name], MD;  [facility];  Service: Endoscopy;  Laterality: N/A;   EUS N/A 12/15/2016   Procedure: UPPER ENDOSCOPIC ULTRASOUND (EUS) LINEAR;  Surgeon: [name], MD;  [facility];  Service: Endoscopy;  Laterality: N/A;   HERNIA REPAIR  [id number]   Umbilical Hernia Repair   LAPAROSCOPIC PARTIAL COLECTOMY N/A 08/14/2013   Procedure: LAPAROSCOPIC PARTIAL COLECTOMY wound vac placement;  Surgeon: [name], MD;  [facility];  Service: General;  Laterality: N/A;   MEDIASTINOSCOPY N/A 12/28/2016   Procedure: MEDIASTINOSCOPY;  Surgeon: [name], MD;  Location: [place];  Service: Thoracic;  Laterality: N/A;   PORT-A-CATH REMOVAL N/A 05/14/2014   Procedure: REMOVAL PORT-A-CATH;  Surgeon: [name], MD;  [facility];  Service: General;  Laterality: N/A;   PORTACATH PLACEMENT Left 09/16/2013   Procedure: INSERTION PORT-A-CATH;  Surgeon: [name], MD;  Location: [place];  Service: General;  Laterality: Left;  dressing change on lower abdominal wound   VIDEO BRONCHOSCOPY WITH ENDOBRONCHIAL ULTRASOUND N/A 12/28/2016   Procedure: VIDEO BRONCHOSCOPY WITH ENDOBRONCHIAL ULTRASOUND;  Surgeon: [name], MD;  [facility];  Service: Thoracic;  Laterality: N/A;    FAMHx:  Family History  Problem Relation Age of Onset   Heart disease Father     SOCHx:   reports that he has never smoked. He has never used smokeless tobacco. He reports current alcohol use. He reports that he does not use drugs.  ALLERGIES:  Allergies  Allergen Reactions   Penicillins Hives and Other (See Comments)    Has patient had a PCN reaction causing immediate rash, facial/tongue/throat swelling, SOB or lightheadedness with hypotension: No Has patient had  a PCN reaction causing SEVERE RASH INVOLVING MUCUS MEMBRANES or SKIN NECROSIS: #  #  #  YES  #  #  #  Has patient had a PCN reaction that required hospitalization #  #  #  YES  #  #  #  Has patient had a PCN reaction occurring within the last 10 years: No     ROS: Pertinent items noted in HPI and remainder of comprehensive ROS otherwise negative.  HOME MEDS: Current Outpatient Medications on File Prior to Visit  Medication Sig Dispense Refill   ACCU-CHEK GUIDE test strip 1 each by Other route daily. Dx E11.9 100 each 2   allopurinol (ZYLOPRIM) 300 MG tablet TAKE 1 TABLET BY MOUTH EVERY DAY 90 tablet 1   aspirin EC 81 MG tablet Take 1 tablet (81 mg total) by mouth daily. Swallow whole. 90 tablet  3   blood glucose meter kit and supplies KIT Dispense based on patient and insurance preference. Use up to four times daily as directed. 1 each 0   carbamazepine (TEGRETOL) 200 MG tablet TAKE 2 TABLETS BY MOUTH AT BEDTIME. 180 tablet 1   cholecalciferol (VITAMIN D3) 25 MCG (1000 UT) tablet Take 1,000 Units by mouth daily.     Coenzyme Q10 (COQ10) 100 MG CAPS Take 100 mg by mouth daily.     HYDROcodone-acetaminophen (NORCO/VICODIN) 5-325 MG tablet Take 1 tablet by mouth every 6 (six) hours as needed for severe pain. 15 tablet 0   icosapent Ethyl (VASCEPA) 1 g capsule Take 1 capsule (1 g total) by mouth daily. 90 capsule 1   metFORMIN (GLUCOPHAGE-XR) 500 MG 24 hr tablet Take 2 tablets (1,000 mg total) by mouth 2 (two) times daily with a meal. 360 tablet 3   metoprolol succinate (TOPROL XL) 25 MG 24 hr tablet Take 1 tablet (25 mg total) by mouth at bedtime. 90 tablet 3   Multiple Vitamins-Minerals (MULTIVITAMIN WITH MINERALS) tablet Take 1 tablet by mouth daily.     rosuvastatin (CRESTOR) 40 MG tablet TAKE 1 TABLET BY MOUTH EVERY DAY 90 tablet 3   tirzepatide (MOUNJARO) 7.5 MG/0.5ML Pen Inject 7.5 mg into the skin once a week. 6 mL 0   vitamin C (ASCORBIC ACID) 500 MG tablet Take 500 mg by mouth daily.      No current facility-administered medications on file prior to visit.    LABS/IMAGING: No results found for this or any previous visit (from the past 48 hour(s)). No results found.  LIPID PANEL:    Component Value Date/Time   CHOL 113 11/10/2022 0947   TRIG 217 (H) 11/10/2022 0947   HDL 27 (L) 11/10/2022 0947   CHOLHDL 4.2 11/10/2022 0947   CHOLHDL 11 12/06/2021 1532   LDLCALC 51 11/10/2022 0947   LDLCALC  09/17/2020 1543     Comment:     . LDL cholesterol not calculated. Triglyceride levels greater than 400 mg/dL invalidate calculated LDL results. . Reference range: <100 . Desirable range <100 mg/dL for primary prevention;   <70 mg/dL for patients with CHD or diabetic patients  with > or = 2 CHD risk factors. Marland Kitchen LDL-C is now calculated using the Martin-Hopkins  calculation, which is a validated novel method providing  better accuracy than the Friedewald equation in the  estimation of LDL-C.  Cresenciano Genre et al. Annamaria Helling. 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   John Hubbard has had significant improvement in his lipids.  Primarily triglycerides which I think in large part were due to his poorly controlled diabetes however I suspect there is underlying familial hypertriglyceridemia.  He is doing very well on therapy.  His A1c is also improved  significantly.  He is also losing weight and  is on Mounjaro.  I would continue these therapies.  He should stop over-the-counter fish oil as he is on Vascepa and could contemplate increasing to 3 or 4 g a day which would be the recommended total dose if he can tolerate it with regards to GI side effects.  Will plan repeat lipids in about 6 months to ensure stability.  He is now following with Dr. Gasper Sells for general cardiology.  Pixie Casino, MD, [facility], [place] Director of the Advanced Lipid Disorders &  Cardiovascular Risk Reduction Clinic Diplomate of the American Board of Clinical Lipidology Attending Cardiologist  Direct Dial: [phone]  Fax: [phone]  Website:  [email or web address]  Nadean Corwin Akima Slaugh 11/29/2022, 1:32 PM

## 2022-11-29 NOTE — Patient Instructions (Addendum)
Medication Instructions:  STOP over the counter fish oil   INCREASE vascepa to 3-4 gram daily  *If you need a refill on your cardiac medications before your next appointment, please call your pharmacy*   Lab Work: FASTING lab work in 6 months   If you have labs (blood work) drawn today and your tests are completely normal, you will receive your results only by: [place] (if you have MyChart) OR A paper copy in the mail If you have any lab test that is abnormal or we need to change your treatment, we will call you to review the results.   Follow-Up: At [facility], you and your health needs are our priority.  As part of our continuing mission to provide you with exceptional heart care, we have created designated Provider Care Teams.  These Care Teams include your primary Cardiologist (physician) and Advanced Practice Providers (APPs -  Physician Assistants and Nurse Practitioners) who all work together to provide you with the care you need, when you need it.  We recommend signing up for the patient portal called "MyChart".  Sign up information is provided on this After Visit Summary.  MyChart is used to connect with patients for Virtual Visits (Telemedicine).  Patients are able to view lab/test results, encounter notes, upcoming appointments, etc.  Non-urgent messages can be sent to your provider as well.   To learn more about what you can do with MyChart, go to [email or web address].    Your next appointment:    6 months with Dr. Debara Pickett -- lipid clinic

## 2022-12-01 DIAGNOSIS — M79671 Pain in right foot: Secondary | ICD-10-CM | POA: Diagnosis not present

## 2022-12-03 ENCOUNTER — Other Ambulatory Visit: Payer: Self-pay | Admitting: Family Medicine

## 2022-12-05 ENCOUNTER — Other Ambulatory Visit: Payer: Self-pay

## 2022-12-05 DIAGNOSIS — M79671 Pain in right foot: Secondary | ICD-10-CM | POA: Diagnosis not present

## 2022-12-05 MED ORDER — ROSUVASTATIN CALCIUM 40 MG PO TABS: 40.0000 mg | ORAL_TABLET | Freq: Every day | ORAL | 3 refills | Status: DC

## 2022-12-07 DIAGNOSIS — M79671 Pain in right foot: Secondary | ICD-10-CM | POA: Diagnosis not present

## 2022-12-13 ENCOUNTER — Telehealth: Payer: Self-pay | Admitting: Internal Medicine

## 2022-12-13 ENCOUNTER — Other Ambulatory Visit: Payer: Self-pay

## 2022-12-13 MED ORDER — ICOSAPENT ETHYL 1 G PO CAPS: 1.0000 g | ORAL_CAPSULE | Freq: Every day | ORAL | 1 refills | Status: DC

## 2022-12-13 NOTE — Telephone Encounter (Signed)
*  STAT* If patient is at the pharmacy, call can be transferred to refill team.   1. Which medications need to be refilled? (please list name of each medication and dose if known) icosapent Ethyl (VASCEPA) 1 g capsule   2. Which pharmacy/location (including street and city if local pharmacy) is medication to be sent to?   [id number] - [place], [place] [address]    3. Do they need a 30 day or 90 day supply? [place]

## 2022-12-14 DIAGNOSIS — S82851D Displaced trimalleolar fracture of right lower leg, subsequent encounter for closed fracture with routine healing: Secondary | ICD-10-CM | POA: Diagnosis not present

## 2022-12-28 DIAGNOSIS — S82851D Displaced trimalleolar fracture of right lower leg, subsequent encounter for closed fracture with routine healing: Secondary | ICD-10-CM | POA: Diagnosis not present

## 2023-01-11 ENCOUNTER — Telehealth: Payer: Self-pay | Admitting: Family Medicine

## 2023-01-11 NOTE — Telephone Encounter (Signed)
  Encourage patient to contact the pharmacy for refills or they can request refills through [facility]  LAST APPOINTMENT DATE:  Please schedule appointment if longer than 1 year  NEXT APPOINTMENT DATE:  MEDICATION:  tirzepatide (MOUNJARO) 7.5 MG/0.5ML Pen   Is the patient out of medication? ALMOST  PHARMACY: [id number] - [place], [place] - [address]  [address] [place] [address]   Let patient know to contact pharmacy at the end of the day to make sure medication is ready.  Please notify patient to allow 48-72 hours to process

## 2023-01-12 ENCOUNTER — Other Ambulatory Visit: Payer: Self-pay | Admitting: *Deleted

## 2023-01-12 MED ORDER — TIRZEPATIDE 7.5 MG/0.5ML ~~LOC~~ SOAJ: 7.5000 mg | SUBCUTANEOUS | 0 refills | Status: DC

## 2023-01-12 NOTE — Telephone Encounter (Signed)
Rx send to [facility]  

## 2023-02-06 ENCOUNTER — Telehealth: Payer: Self-pay | Admitting: Internal Medicine

## 2023-02-06 MED ORDER — ICOSAPENT ETHYL 1 G PO CAPS: ORAL_CAPSULE | ORAL | 3 refills | Status: DC

## 2023-02-06 NOTE — Telephone Encounter (Signed)
Rx(s) sent to pharmacy electronically.  

## 2023-02-06 NOTE — Telephone Encounter (Signed)
Should Rx be updated? Per Dr. Blanchie Dessert last note, increase Vascepa to 3-4 g daily. Rx still says 1 g daily.

## 2023-02-06 NOTE — Telephone Encounter (Signed)
*  STAT* If patient is at the pharmacy, call can be transferred to refill team.   1. Which medications need to be refilled? (please list name of each medication and dose if known)   icosapent Ethyl (VASCEPA) 1 g capsule    2. Which pharmacy/location (including street and city if local pharmacy) is medication to be sent to? [id number] - [place], [place] - [address]   3. Do they need a 30 day or 90 day supply? 90 day    Pt is completely out of medication.

## 2023-03-06 ENCOUNTER — Inpatient Hospital Stay: Payer: Federal, State, Local not specified - PPO | Attending: Oncology | Admitting: Oncology

## 2023-03-06 VITALS — BP 150/80 | HR 82 | Temp 98.2°F | Resp 18 | Ht 70.0 in | Wt 250.8 lb

## 2023-03-06 DIAGNOSIS — Z9221 Personal history of antineoplastic chemotherapy: Secondary | ICD-10-CM | POA: Insufficient documentation

## 2023-03-06 DIAGNOSIS — C189 Malignant neoplasm of colon, unspecified: Secondary | ICD-10-CM

## 2023-03-06 DIAGNOSIS — Z85038 Personal history of other malignant neoplasm of large intestine: Secondary | ICD-10-CM | POA: Insufficient documentation

## 2023-03-06 DIAGNOSIS — I10 Essential (primary) hypertension: Secondary | ICD-10-CM | POA: Diagnosis not present

## 2023-03-06 DIAGNOSIS — M109 Gout, unspecified: Secondary | ICD-10-CM | POA: Diagnosis not present

## 2023-03-06 DIAGNOSIS — E785 Hyperlipidemia, unspecified: Secondary | ICD-10-CM | POA: Diagnosis not present

## 2023-03-06 NOTE — Progress Notes (Signed)
[facility] OFFICE PROGRESS NOTE   Diagnosis: Colon cancer  INTERVAL HISTORY:   John Hubbard returns as scheduled.  He feels well.  No difficulty with bowel function.  He reports intentional weight loss with a change in his diet and medical therapy.  He fractured his ankle in February.  Objective:  Vital signs in last 24 hours:  Blood pressure (!) 150/80, pulse 82, temperature 98.2 F (36.8 C), temperature source Oral, resp. rate 18, height 5\' 10"  (1.778 m), weight 250 lb 12.8 oz (113.8 kg), SpO2 98 %.     Lymphatics: No cervical, supraclavicular, axillary, or inguinal nodes Resp: Lungs clear bilaterally Cardio: Regular rate and rhythm GI: No mass, nontender, no hepatosplenomegaly, suture material at a left abdomen trocar site Vascular: No leg edema   Lab Results:  Lab Results  Component Value Date   WBC 7.2 06/21/2022   HGB 14.6 06/21/2022   HCT 42.9 06/21/2022   MCV 80.9 06/21/2022   PLT 241 06/21/2022   NEUTROABS 3.9 10/28/2016    CMP  Lab Results  Component Value Date   NA 134 (L) 06/21/2022   K 4.2 06/21/2022   CL 100 06/21/2022   CO2 24 06/21/2022   GLUCOSE 414 (H) 06/21/2022   BUN 21 06/21/2022   CREATININE 0.83 06/21/2022   CALCIUM 9.4 06/21/2022   PROT 6.8 11/10/2022   ALBUMIN 4.8 11/10/2022   AST 17 11/10/2022   ALT 25 11/10/2022   ALKPHOS 109 11/10/2022   BILITOT 0.2 11/10/2022   GFRNONAA >60 06/21/2022   GFRAA >60 04/16/2019    Lab Results  Component Value Date   CEA1 3.75 04/16/2019   CEA 2.1 08/15/2014     Medications: I have reviewed the patient's current medications.   Assessment/Plan: Stage III (T3 N1) poorly differentiated adenocarcinoma of the sigmoid colon status post low anterior resection 08/14/2013. The tumor returned microsatellite stable with no loss of mismatch repair protein expression. Cycle 1 adjuvant CAPOX beginning 09/18/2013.   Cycle 7 CAPOX 01/22/2014   Cycle 8 CAPOX 02/12/2014 (oxaliplatin  deleted). Surveillance colonoscopy January 2016 CT scans chest, abdomen and pelvis 08/15/2014 with no evidence of metastatic disease. CT scans 11/15/2016-interval development of mediastinal adenopathy. Enlarging nodule within right upper lobe. No mass or adenopathy identified within the abdomen or pelvis. PET scan 12/05/2016-FDG avid seen in the mediastinum and hila. Nodule in the medial right upper lobe increased since 2015, no abnormal FDG uptake in the region of the nodule. Epicardial node inferior to the liver dome FDG avid. Porta hepatis and portacaval nodes FDG avid as well. Also FDG avid node anterior to the right side of the crus. EUS biopsy of a portacaval node on 12/15/2016-nondiagnostic Bronchoscopy/mediastinoscopy 12/28/2016-biopsies of level 7 and leve l4R nodes negative for malignancy. Changes consistent with a fibrotic lymph node-potentially old granulomas identified on the level 7 node CT chest 04/16/2019-resolution of mediastinal adenopathy, decrease in size of right upper lobe subpleural nodule, morphologic features of cirrhosis Colonoscopy 06/14/2019-negative Gout. He continues allopurinol. History of anxiety maintained on Tegretol. Hypertension. History of delayed nausea. Improved with Aloxi/Emend Rash-the rash occurred following cycle 7 CAPOX. It is possible the rash was related to capecitabine. Improved. 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 Hyperlipidemia      Disposition: John Hubbard is in clinical remission from colon cancer.  He is now almost 10 years out from diagnosis.  He would like to continue follow-up at the Cancer  center.  He will return for an office visit in 1 year.  He last had a surveillance colonoscopy by Dr. Maisie Fus in September 2020.  The colonoscopy was unremarkable.  I will contact Dr. Maisie Fus to get her recommendation for scheduling the next colonoscopy  and management of the retained suture.  Thornton Papas, MD  03/06/2023  3:31 PM

## 2023-03-27 ENCOUNTER — Encounter: Payer: Self-pay | Admitting: Gastroenterology

## 2023-03-30 ENCOUNTER — Other Ambulatory Visit: Payer: Self-pay | Admitting: Family Medicine

## 2023-03-30 DIAGNOSIS — M1A9XX0 Chronic gout, unspecified, without tophus (tophi): Secondary | ICD-10-CM

## 2023-04-19 ENCOUNTER — Other Ambulatory Visit: Payer: Self-pay | Admitting: Family Medicine

## 2023-04-20 ENCOUNTER — Ambulatory Visit (AMBULATORY_SURGERY_CENTER): Payer: Federal, State, Local not specified - PPO

## 2023-04-20 VITALS — Ht 70.0 in | Wt 248.0 lb

## 2023-04-20 DIAGNOSIS — Z85038 Personal history of other malignant neoplasm of large intestine: Secondary | ICD-10-CM

## 2023-04-20 MED ORDER — NA SULFATE-K SULFATE-MG SULF 17.5-3.13-1.6 GM/177ML PO SOLN: 1.0000 | Freq: Once | ORAL | 0 refills | Status: AC

## 2023-04-20 NOTE — Progress Notes (Signed)
Pre visit completed via phone call; Patient verified name, DOB, and address;  No egg or soy allergy known to patient  Issues known to pt with past sedation with any surgeries or procedures-patient reports during his last colonoscopy he woke up and then reports he was hard to wake up in the recovery room; patient requests to have enough anesthesia to "make sure I don't wake up during the procedure"; Patient denies ever being told they had issues or difficulty with intubation;  No FH of Malignant Hyperthermia; Pt is not on diet pills; Pt is not on home 02;  Pt is not on blood thinners; Pt reports issues with constipation-not in the last month-patient reports he takes sennakot, also increases his oral fluids/fruits/veggies/activity;  No A fib or A flutter; Have any cardiac testing pending--NO Pt instructed to use [employer] or GoodRx for a price reduction on prep;   Insurance verified during PV appt=[employer]  Patient's chart reviewed by Cathlyn Parsons CNRA prior to previsit and patient appropriate for the [facility].  Previsit completed and red dot placed by patient's name on their procedure day (on provider's schedule).    Prep instructions sent to patient's MyChart per his request;

## 2023-05-09 ENCOUNTER — Encounter: Payer: Self-pay | Admitting: Certified Registered Nurse Anesthetist

## 2023-05-10 ENCOUNTER — Ambulatory Visit (AMBULATORY_SURGERY_CENTER): Payer: Federal, State, Local not specified - PPO | Admitting: Gastroenterology

## 2023-05-10 ENCOUNTER — Encounter: Payer: Self-pay | Admitting: Gastroenterology

## 2023-05-10 VITALS — BP 127/84 | HR 75 | Temp 98.4°F | Resp 19 | Ht 70.0 in | Wt 248.0 lb

## 2023-05-10 DIAGNOSIS — K635 Polyp of colon: Secondary | ICD-10-CM | POA: Diagnosis not present

## 2023-05-10 DIAGNOSIS — Z85038 Personal history of other malignant neoplasm of large intestine: Secondary | ICD-10-CM

## 2023-05-10 DIAGNOSIS — D124 Benign neoplasm of descending colon: Secondary | ICD-10-CM

## 2023-05-10 DIAGNOSIS — K621 Rectal polyp: Secondary | ICD-10-CM | POA: Diagnosis not present

## 2023-05-10 DIAGNOSIS — D128 Benign neoplasm of rectum: Secondary | ICD-10-CM

## 2023-05-10 DIAGNOSIS — Z08 Encounter for follow-up examination after completed treatment for malignant neoplasm: Secondary | ICD-10-CM | POA: Diagnosis not present

## 2023-05-10 MED ORDER — SODIUM CHLORIDE 0.9 % IV SOLN: 500.0000 mL | Freq: Once | INTRAVENOUS | Status: AC

## 2023-05-10 NOTE — Progress Notes (Signed)
Pt's states no medical or surgical changes since previsit or office visit. 

## 2023-05-10 NOTE — Op Note (Signed)
[facility] Patient Name: John Hubbard Procedure Date: 05/10/2023 10:48 AM MRN: [id number] Endoscopist: Lynann Bologna , MD, [id number] Age: 62 Referring MD:  Date of Birth: [birth year] Gender: Male Account #: [id number] Procedure:                Colonoscopy Indications:              High risk colon cancer surveillance: Personal                            history of colon cancer- Stage III (T3 N1) poorly                            differentiated adenocarcinoma of the sigmoid colon                            status post low anterior resection 08/14/2013 Medicines:                Monitored Anesthesia Care Procedure:                Pre-Anesthesia Assessment:                           - Prior to the procedure, a History and Physical                            was performed, and patient medications and                            allergies were reviewed. The patient's tolerance of                            previous anesthesia was also reviewed. The risks                            and benefits of the procedure and the sedation                            options and risks were discussed with the patient.                            All questions were answered, and informed consent                            was obtained. Prior Anticoagulants: The patient has                            taken no anticoagulant or antiplatelet agents. ASA                            Grade Assessment: II - A patient with mild systemic                            disease. After reviewing the risks and benefits,  the patient was deemed in satisfactory condition to                            undergo the procedure.                           After obtaining informed consent, the colonoscope                            was passed under direct vision. Throughout the                            procedure, the patient's blood pressure, pulse, and                            oxygen saturations  were monitored continuously. The                            Olympus Scope [id number] was introduced through the                            anus and advanced to the the cecum, identified by                            appendiceal orifice and ileocecal valve. The                            colonoscopy was performed without difficulty. The                            patient tolerated the procedure well. The quality                            of the bowel preparation was good. The ileocecal                            valve, appendiceal orifice, and rectum were                            photographed. Scope In: 10:51:27 AM Scope Out: 11:07:41 AM Scope Withdrawal Time: 0 hours 11 minutes 18 seconds  Total Procedure Duration: 0 hours 16 minutes 14 seconds  Findings:                 There was evidence of a prior end-to-end                            colo-colonic anastomosis in the sigmoid colon, 18                            cm from the anal verge. This was patent and was                            characterized by healthy appearing mucosa.  Two sessile polyps were found in the mid rectum and                            proximal descending colon. The polyps were 2 to 4                            mm in size. These polyps were removed with a cold                            snare. Resection and retrieval were complete.                           A few small-mouthed diverticula were found in the                            neo-sigmoid colon and ascending colon.                           Non-bleeding internal hemorrhoids were found during                            retroflexion. The hemorrhoids were moderate and                            Grade I (internal hemorrhoids that do not prolapse).                           The exam was otherwise without abnormality on                            direct and retroflexion views. Complications:            No immediate  complications. Estimated Blood Loss:     Estimated blood loss: none. Impression:               - Patent end-to-end colo-colonic anastomosis,                            characterized by healthy appearing mucosa. No                            endoscopic recurrence.                           - Two 2 to 4 mm polyps in the mid rectum and in the                            proximal descending colon, removed with a cold                            snare. Resected and retrieved.                           - Minimal colonic diverticulosis.                           -  Non-bleeding internal hemorrhoids.                           - The examination was otherwise normal on direct                            and retroflexion views. Recommendation:           - Patient has a contact number available for                            emergencies. The signs and symptoms of potential                            delayed complications were discussed with the                            patient. Return to normal activities tomorrow.                            Written discharge instructions were provided to the                            patient.                           - Resume previous diet.                           - Continue present medications.                           - Await pathology results.                           - Repeat colonoscopy for surveillance based on                            pathology results.                           - The findings and recommendations were discussed                            with the patient's family. Lynann Bologna, MD 05/10/2023 11:13:41 AM This report has been signed electronically.

## 2023-05-10 NOTE — Progress Notes (Signed)
Report given to PACU, vss 

## 2023-05-10 NOTE — Progress Notes (Signed)
Called to room to assist during endoscopic procedure.  Patient ID and intended procedure confirmed with present staff. Received instructions for my participation in the procedure from the performing physician.  

## 2023-05-10 NOTE — Progress Notes (Signed)
[facility] History and Physical   Primary Care Physician:  Ardith Dark, MD   Reason for Procedure:   Stage III (T3 N1) poorly differentiated adenocarcinoma of the sigmoid colon status post low anterior resection 08/14/2013   Plan:    Surveillance colonoscopy     HPI: John Hubbard is a 62 y.o. male    Past Medical History:  Diagnosis Date   Anxiety    Bipolar disorder (HCC)    Colon cancer (HCC)    hx colon cancer, surgery, chemotherapy -last 5'15   Complication of anesthesia    woke-up during colonoscopy; difficult to wake   Diabetes mellitus without complication (HCC)    on meds   GERD (gastroesophageal reflux disease)    withcertain foods/take OTC PRN meds   Gout    History of blood transfusion 2015   Hypertension    Neuropathy    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/21/2022    Past Surgical History:  Procedure Laterality Date   COLONOSCOPY Bilateral 07/24/2013   Procedure: COLONOSCOPY;  Surgeon: Romie Levee, MD;  Location: [facility];  Service: Endoscopy;  Laterality: Bilateral;   COLONOSCOPY N/A 06/14/2019   Procedure: COLONOSCOPY;  Surgeon: Romie Levee, MD;  Location: [facility];  Service: Endoscopy;  Laterality: N/A;   COLONOSCOPY WITH PROPOFOL N/A 10/23/2014   Procedure: COLONOSCOPY WITH PROPOFOL;  Surgeon: Romie Levee, MD;  Location: [facility];  Service: Endoscopy;  Laterality: N/A;   EUS N/A 12/15/2016   Procedure: UPPER ENDOSCOPIC ULTRASOUND (EUS) LINEAR;  Surgeon: Rachael Fee, MD;  Location: [facility];  Service: Endoscopy;  Laterality: N/A;   HERNIA REPAIR  06/19/13   Umbilical Hernia Repair   LAPAROSCOPIC PARTIAL COLECTOMY N/A 08/14/2013   Procedure: LAPAROSCOPIC PARTIAL COLECTOMY wound vac placement;  Surgeon: Romie Levee, MD;  Location: [facility];  Service: General;  Laterality: N/A;   MEDIASTINOSCOPY N/A 12/28/2016   Procedure: MEDIASTINOSCOPY;  Surgeon: Delight Ovens, MD;  Location: [facility];  Service: Thoracic;  Laterality:  N/A;   PORT-A-CATH REMOVAL N/A 05/14/2014   Procedure: REMOVAL PORT-A-CATH;  Surgeon: Romie Levee, MD;  Location: [facility];  Service: General;  Laterality: N/A;   PORTACATH PLACEMENT Left 09/16/2013   Procedure: INSERTION PORT-A-CATH;  Surgeon: Romie Levee, MD;  Location: [facility];  Service: General;  Laterality: Left;  dressing change on lower abdominal wound   VIDEO BRONCHOSCOPY WITH ENDOBRONCHIAL ULTRASOUND N/A 12/28/2016   Procedure: VIDEO BRONCHOSCOPY WITH ENDOBRONCHIAL ULTRASOUND;  Surgeon: Delight Ovens, MD;  Location: [facility];  Service: Thoracic;  Laterality: N/A;    Prior to Admission medications   Medication Sig Start Date End Date Taking? Authorizing Provider  ACCU-CHEK GUIDE test strip 1 each by Other route daily. Dx E11.9 07/28/22   Ardith Dark, MD  allopurinol (ZYLOPRIM) 300 MG tablet TAKE 1 TABLET BY MOUTH EVERY DAY 03/30/23   Ardith Dark, MD  aspirin EC 81 MG tablet Take 1 tablet (81 mg total) by mouth daily. Swallow whole. 11/04/20   Lyn Records, MD  blood glucose meter kit and supplies KIT Dispense based on patient and insurance preference. Use up to four times daily as directed. 06/21/22   Ardith Dark, MD  carbamazepine (TEGRETOL) 200 MG tablet TAKE 2 TABLETS BY MOUTH AT BEDTIME 12/05/22   Ardith Dark, MD  cholecalciferol (VITAMIN D3) 25 MCG (1000 UT) tablet Take 1,000 Units by mouth daily.    [provider]  Coenzyme Q10 (COQ10) 100 MG CAPS Take 100 mg by  mouth daily.    [provider]  HYDROcodone-acetaminophen (NORCO/VICODIN) 5-325 MG tablet Take 1 tablet by mouth every 6 (six) hours as needed for severe pain. Patient not taking: Reported on 03/06/2023 06/09/22   Wallis Bamberg, PA-C  icosapent Ethyl (VASCEPA) 1 g capsule Take 2 capsules (2 gram) by mouth twice daily. Dr. Rennis Golden recommended at least 3-4 gram daily. 02/06/23   Hilty, Lisette Abu, MD  metFORMIN (GLUCOPHAGE-XR) 500 MG 24 hr tablet Take 2 tablets (1,000 mg total) by mouth  2 (two) times daily with a meal. 06/28/22   Ardith Dark, MD  metoprolol succinate (TOPROL XL) 25 MG 24 hr tablet Take 1 tablet (25 mg total) by mouth at bedtime. 11/10/22   Christell Constant, MD  Multiple Vitamins-Minerals (MULTIVITAMIN WITH MINERALS) tablet Take 1 tablet by mouth daily.    [provider]  rosuvastatin (CRESTOR) 40 MG tablet Take 1 tablet (40 mg total) by mouth daily. 12/05/22   Christell Constant, MD  tirzepatide [facility]) 7.5 MG/0.5ML Pen INJECT 7.5 MG SUBCUTANEOUSLY WEEKLY 04/19/23   Ardith Dark, MD  vitamin C (ASCORBIC ACID) 500 MG tablet Take 500 mg by mouth daily.    [provider]    Current Outpatient Medications  Medication Sig Dispense Refill   ACCU-CHEK GUIDE test strip 1 each by Other route daily. Dx E11.9 100 each 2   allopurinol (ZYLOPRIM) 300 MG tablet TAKE 1 TABLET BY MOUTH EVERY DAY 90 tablet 1   aspirin EC 81 MG tablet Take 1 tablet (81 mg total) by mouth daily. Swallow whole. 90 tablet 3   blood glucose meter kit and supplies KIT Dispense based on patient and insurance preference. Use up to four times daily as directed. 1 each 0   carbamazepine (TEGRETOL) 200 MG tablet TAKE 2 TABLETS BY MOUTH AT BEDTIME 180 tablet 1   cholecalciferol (VITAMIN D3) 25 MCG (1000 UT) tablet Take 1,000 Units by mouth daily.     Coenzyme Q10 (COQ10) 100 MG CAPS Take 100 mg by mouth daily.     HYDROcodone-acetaminophen (NORCO/VICODIN) 5-325 MG tablet Take 1 tablet by mouth every 6 (six) hours as needed for severe pain. (Patient not taking: Reported on 03/06/2023) 15 tablet 0   icosapent Ethyl (VASCEPA) 1 g capsule Take 2 capsules (2 gram) by mouth twice daily. Dr. Rennis Golden recommended at least 3-4 gram daily. 360 capsule 3   metFORMIN (GLUCOPHAGE-XR) 500 MG 24 hr tablet Take 2 tablets (1,000 mg total) by mouth 2 (two) times daily with a meal. 360 tablet 3   metoprolol succinate (TOPROL XL) 25 MG 24 hr tablet Take 1 tablet (25 mg total) by mouth at bedtime.  90 tablet 3   Multiple Vitamins-Minerals (MULTIVITAMIN WITH MINERALS) tablet Take 1 tablet by mouth daily.     rosuvastatin (CRESTOR) 40 MG tablet Take 1 tablet (40 mg total) by mouth daily. 90 tablet 3   tirzepatide (MOUNJARO) 7.5 MG/0.5ML Pen INJECT 7.5 MG SUBCUTANEOUSLY WEEKLY 2 mL 2   vitamin C (ASCORBIC ACID) 500 MG tablet Take 500 mg by mouth daily.     No current facility-administered medications for this visit.    Allergies as of 05/10/2023 - Review Complete 05/10/2023  Allergen Reaction Noted   Penicillins Hives and Other (See Comments) 05/10/2013    Family History  Problem Relation Age of Onset   Heart disease Father    Colon polyps Brother 64   Colon cancer Neg Hx    Esophageal cancer Neg Hx  Rectal cancer Neg Hx    Stomach cancer Neg Hx     Social History   Socioeconomic History   Marital status: Married    Spouse name: Not on file   Number of children: Not on file   Years of education: Not on file   Highest education level: Not on file  Occupational History   Not on file  Tobacco Use   Smoking status: Never   Smokeless tobacco: Never  Vaping Use   Vaping status: Never Used  Substance and Sexual Activity   Alcohol use: Yes    Alcohol/week: 1.0 standard drink of alcohol    Types: 1 Standard drinks or equivalent per week    Comment: occasional 1/wk   Drug use: No   Sexual activity: Yes  Other Topics Concern   Not on file  Social History Narrative   Not on file   Social Determinants of Health   Financial Resource Strain: Not on file  Food Insecurity: Not on file  Transportation Needs: Not on file  Physical Activity: Not on file  Stress: Not on file  Social Connections: Not on file  Intimate Partner Violence: Not on file    Review of Systems: Positive for none All other review of systems negative except as mentioned in the HPI.  Physical Exam: Vital signs in last 24 hours: @VSRANGES @   General:   Alert,  Well-developed, well-nourished,  pleasant and cooperative in NAD Lungs:  Clear throughout to auscultation.   Heart:  Regular rate and rhythm; no murmurs, clicks, rubs,  or gallops. Abdomen:  Soft, nontender and nondistended. Normal bowel sounds.   Neuro/Psych:  Alert and cooperative. Normal mood and affect. A and O x 3    No significant changes were identified.  The patient continues to be an appropriate candidate for the planned procedure and anesthesia.   Edman Circle, MD. [facility] 05/10/2023 10:41 AM@

## 2023-05-10 NOTE — Patient Instructions (Signed)
YOU HAD AN ENDOSCOPIC PROCEDURE TODAY AT THE [facility]:   Refer to the procedure report that was given to you for any specific questions about what was found during the examination.  If the procedure report does not answer your questions, please call your gastroenterologist to clarify.  If you requested that your care partner not be given the details of your procedure findings, then the procedure report has been included in a sealed envelope for you to review at your convenience later.  YOU SHOULD EXPECT: Some feelings of bloating in the abdomen. Passage of more gas than usual.  Walking can help get rid of the air that was put into your GI tract during the procedure and reduce the bloating. If you had a lower endoscopy (such as a colonoscopy or flexible sigmoidoscopy) you may notice spotting of blood in your stool or on the toilet paper. If you underwent a bowel prep for your procedure, you may not have a normal bowel movement for a few days.  Please Note:  You might notice some irritation and congestion in your nose or some drainage.  This is from the oxygen used during your procedure.  There is no need for concern and it should clear up in a day or so.  SYMPTOMS TO REPORT IMMEDIATELY:  Following lower endoscopy (colonoscopy or flexible sigmoidoscopy):  Excessive amounts of blood in the stool  Significant tenderness or worsening of abdominal pains  Swelling of the abdomen that is new, acute  Fever of 100F or higher   For urgent or emergent issues, a gastroenterologist can be reached at any hour by calling [phone]. Do not use MyChart messaging for urgent concerns.    DIET:  We do recommend a small meal at first, but then you may proceed to your regular diet.  Drink plenty of fluids but you should avoid alcoholic beverages for 24 hours.  MEDICATIONS: Continue present medications.  Please see handouts given to you by you by your recovery nurse.  FOLLOW UP: Await  pathology results. Repeat colonoscopy for surveillance based on pathology results.  Thank you for allowing [place] to providing for your healthcare needs today.  ACTIVITY:  You should plan to take it easy for the rest of today and you should NOT DRIVE or use heavy machinery until tomorrow (because of the sedation medicines used during the test).    FOLLOW UP: Our staff will call the number listed on your records the next business day following your procedure.  We will call around 7:15- 8:00 am to check on you and address any questions or concerns that you may have regarding the information given to you following your procedure. If we do not reach you, we will leave a message.     If any biopsies were taken you will be contacted by phone or by letter within the next 1-3 weeks.  Please call us at [phone] if you have not heard about the biopsies in 3 weeks.    SIGNATURES/CONFIDENTIALITY: You and/or your care partner have signed paperwork which will be entered into your electronic medical record.  These signatures attest to the fact that that the information above on your After Visit Summary has been reviewed and is understood.  Full responsibility of the confidentiality of this discharge information lies with you and/or your care-partner.

## 2023-05-11 ENCOUNTER — Other Ambulatory Visit: Payer: Self-pay | Admitting: Family Medicine

## 2023-05-11 ENCOUNTER — Telehealth: Payer: Self-pay

## 2023-05-11 NOTE — Telephone Encounter (Signed)
Post procedure follow up call, no answer 

## 2023-05-21 ENCOUNTER — Encounter: Payer: Self-pay | Admitting: Gastroenterology

## 2023-05-30 ENCOUNTER — Encounter (HOSPITAL_BASED_OUTPATIENT_CLINIC_OR_DEPARTMENT_OTHER): Payer: Federal, State, Local not specified - PPO | Admitting: Internal Medicine

## 2023-06-12 DIAGNOSIS — E781 Pure hyperglyceridemia: Secondary | ICD-10-CM | POA: Diagnosis not present

## 2023-06-12 DIAGNOSIS — E785 Hyperlipidemia, unspecified: Secondary | ICD-10-CM | POA: Diagnosis not present

## 2023-06-12 NOTE — Addendum Note (Signed)
Addended by: Marlene Lard on: 06/12/2023 10:06 AM   Modules accepted: Orders

## 2023-06-14 ENCOUNTER — Encounter (HOSPITAL_BASED_OUTPATIENT_CLINIC_OR_DEPARTMENT_OTHER): Payer: Self-pay | Admitting: Nurse Practitioner

## 2023-06-14 ENCOUNTER — Ambulatory Visit (HOSPITAL_BASED_OUTPATIENT_CLINIC_OR_DEPARTMENT_OTHER): Payer: Federal, State, Local not specified - PPO | Admitting: Nurse Practitioner

## 2023-06-14 VITALS — BP 125/74 | HR 87 | Ht 70.0 in | Wt 254.0 lb

## 2023-06-14 DIAGNOSIS — E785 Hyperlipidemia, unspecified: Secondary | ICD-10-CM

## 2023-06-14 DIAGNOSIS — E781 Pure hyperglyceridemia: Secondary | ICD-10-CM

## 2023-06-14 NOTE — Progress Notes (Signed)
Cardiology Office Note:  .   Date:  06/14/2023  ID:  John Hubbard, DOB 10-Mar-1961, MRN [id number] PCP: Ardith Dark, MD  [facility] Providers Cardiologist:  Christell Constant, MD    Patient Profile: .      PMH Dyslipidemia Hypertriglyceridemia Type 2 DM Family history heart disease and elevated triglycerides in father and brother   He was referred to lipid clinic and seen by Dr. Rennis Golden 04/2021.  He was also followed by Dr. Katrinka Blazing and now Dr. Raynelle Jan for general cardiology.  Was noted to have significantly elevated cholesterol, primarily driven by triglycerides.  At the time of initial visit total cholesterol 553, triglycerides 3204, HDL 13 and LDL not calculated.  Subsequently he had been started on 40 mg of rosuvastatin however repeat labs just a month later showed marked improvement in his lipids with decrease in triglycerides from 3204 to 861.  It was felt to be due to poorly controlled diabetes.  A1c was 14.1 and came down to 11.2 with some therapy. He was not able to tolerate Vascepa due to stomach upset but did agree to taking an additional dose at bedtime.   At follow-up visit 11/29/22, he had significant improvement in triglycerides down by a factor of 10 from 27 85-2 17.  Hemoglobin A1c also significantly improved from 13.7% to 7.5%.  LDL-C 51, HDL 27.  He was taking Vascepa 2 g daily along with an additional 1 g of over-the-counter fish oil and statin.       History of Present Illness: John Hubbard is a very pleasant 62 y.o. male who is here today for follow-up of dyslipidemia. Reports he is feeling well. 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 Has recently resumed walking for exercise. Broke his ankle one year ago and just now able to resume walking without discomfort. Continues to follow a low carbohydrate diet. He does not have any concerning cardiac symptoms.  Went to [facility] in [place] on 06/12/23, unfortunately labs were never resulted.  ROS: See HPI       Studies Reviewed: .         Risk Assessment/Calculations:             Physical Exam:   VS:  BP 125/74   Pulse 87   Ht 5\' 10"  (1.778 m)   Wt 254 lb (115.2 kg)   BMI 36.45 kg/m    Wt Readings from Last 3 Encounters:  06/14/23 254 lb (115.2 kg)  05/10/23 248 lb (112.5 kg)  04/20/23 248 lb (112.5 kg)    GEN: Well nourished, well developed in no acute distress CARDIAC: RRR RESPIRATORY:  No increased work of breath, no wheezing   EXTREMITIES:  No edema; No deformity     ASSESSMENT AND PLAN: .    Familial Hypertriglyceridemia/Dyslipidemia: He is doing well on current therapy. Unfortunately, the lab work that he had done on 06/12/2023 has not been resulted and appears to be lost.  He will repeat in the next week or so at our [place] office. He had inadvertently been taking Vascepa 1 g twice daily. He agrees to increase Vascepa to 2g twice daily. Encouraged 150 minutes moderate intensity exercise each week and heart healthy diet low in saturated fat, processed foods, sugar, and simple carbohydrates.        Dispo: 1 year with Dr. Rennis Golden or  me  Signed, Eligha Bridegroom, NP-C

## 2023-06-14 NOTE — Patient Instructions (Signed)
Medication Instructions:    Please take your vascepa two ( 2 ) tablets my mouth ( 2 g) twice daily.   *If you need a refill on your cardiac medications before your next appointment, please call your pharmacy*   Lab Work:  TODAY!!! LIPID  If you have labs (blood work) drawn today and your tests are completely normal, you will receive your results only by: MyChart Message (if you have MyChart) OR A paper copy in the mail If you have any lab test that is abnormal or we need to change your treatment, we will call you to review the results.   Testing/Procedures:  None ordered.   Follow-Up: At [facility], you and your health needs are our priority.  As part of our continuing mission to provide you with exceptional heart care, we have created designated Provider Care Teams.  These Care Teams include your primary Cardiologist (physician) and Advanced Practice Providers (APPs -  Physician Assistants and Nurse Practitioners) who all work together to provide you with the care you need, when you need it.  We recommend signing up for the patient portal called "MyChart".  Sign up information is provided on this After Visit Summary.  MyChart is used to connect with patients for Virtual Visits (Telemedicine).  Patients are able to view lab/test results, encounter notes, upcoming appointments, etc.  Non-urgent messages can be sent to your provider as well.   To learn more about what you can do with MyChart, go to [email or web address].    Your next appointment:   1 year(s)  Provider:   K. Italy Hilty, MD or Eligha Bridegroom, NP   Other Instructions  Your physician wants you to follow-up in: 1 year.  You will receive a reminder letter in the mail two months in advance. If you don't receive a letter, please call our office to schedule the follow-up appointment.

## 2023-06-15 LAB — LIPID PANEL
Chol/HDL Ratio: 4.5 ratio (ref 0.0–5.0)
Cholesterol, Total: 127 mg/dL (ref 100–199)
HDL: 28 mg/dL — ABNORMAL LOW (ref >39)
LDL Chol Calc (NIH): 50 mg/dL (ref 0–99)
Triglycerides: 322 mg/dL — ABNORMAL HIGH (ref 0–149)
VLDL Cholesterol Cal: 49 mg/dL — ABNORMAL HIGH (ref 5–40)

## 2023-06-23 ENCOUNTER — Other Ambulatory Visit: Payer: Self-pay | Admitting: Family Medicine

## 2023-07-21 ENCOUNTER — Other Ambulatory Visit: Payer: Self-pay | Admitting: Family Medicine

## 2023-07-28 NOTE — Telephone Encounter (Signed)
Patient called stating that insurance informed him a PA is required for this. States they said this needs to be completed by January 2025 or the medication will not be covered.

## 2023-07-31 ENCOUNTER — Other Ambulatory Visit (HOSPITAL_COMMUNITY): Payer: Self-pay

## 2023-07-31 NOTE — Telephone Encounter (Signed)
LVM informing pt of message below.

## 2023-08-08 ENCOUNTER — Other Ambulatory Visit: Payer: Self-pay | Admitting: Internal Medicine

## 2023-08-10 ENCOUNTER — Telehealth: Payer: Self-pay

## 2023-08-10 ENCOUNTER — Other Ambulatory Visit (HOSPITAL_COMMUNITY): Payer: Self-pay

## 2023-08-10 NOTE — Telephone Encounter (Signed)
Pharmacy Patient Advocate Encounter   Received notification from CoverMyMeds that prior authorization for [facility] is required/requested.   Insurance verification completed.   The patient is insured through [facility] .   Per test claim: PA required; PA started via CoverMyMeds. KEY (Key: [id number]  . Waiting for clinical questions to populate.

## 2023-08-14 ENCOUNTER — Other Ambulatory Visit (HOSPITAL_COMMUNITY): Payer: Self-pay

## 2023-08-14 NOTE — Telephone Encounter (Signed)
Pharmacy Patient Advocate Encounter  Received notification from [facility] that Prior Authorization for John Hubbard has been APPROVED from 11.11.24 to 11.11.25. Ran test claim, Copay is $35.00. This test claim was processed through [facility]- copay amounts may vary at other pharmacies due to pharmacy/plan contracts, or as the patient moves through the different stages of their insurance plan.

## 2023-08-23 ENCOUNTER — Other Ambulatory Visit: Payer: Self-pay | Admitting: Internal Medicine

## 2023-08-23 ENCOUNTER — Other Ambulatory Visit: Payer: Self-pay | Admitting: Family Medicine

## 2023-08-23 ENCOUNTER — Other Ambulatory Visit: Payer: Self-pay

## 2023-08-23 DIAGNOSIS — M1A9XX0 Chronic gout, unspecified, without tophus (tophi): Secondary | ICD-10-CM

## 2023-08-23 MED ORDER — METOPROLOL SUCCINATE ER 25 MG PO TB24: 25.0000 mg | ORAL_TABLET | Freq: Every day | ORAL | 2 refills | Status: DC

## 2023-09-11 DIAGNOSIS — K080 Exfoliation of teeth due to systemic causes: Secondary | ICD-10-CM | POA: Diagnosis not present

## 2023-10-16 ENCOUNTER — Other Ambulatory Visit: Payer: Self-pay | Admitting: Family Medicine

## 2023-10-16 ENCOUNTER — Other Ambulatory Visit: Payer: Self-pay | Admitting: Internal Medicine

## 2023-12-04 ENCOUNTER — Ambulatory Visit: Payer: Federal, State, Local not specified - PPO | Admitting: Internal Medicine

## 2023-12-12 ENCOUNTER — Other Ambulatory Visit: Payer: Self-pay | Admitting: Family Medicine

## 2024-01-11 ENCOUNTER — Other Ambulatory Visit: Payer: Self-pay | Admitting: Family Medicine

## 2024-02-06 ENCOUNTER — Other Ambulatory Visit: Payer: Self-pay | Admitting: Family Medicine

## 2024-02-06 DIAGNOSIS — M1A9XX0 Chronic gout, unspecified, without tophus (tophi): Secondary | ICD-10-CM

## 2024-03-05 ENCOUNTER — Ambulatory Visit: Admitting: Family Medicine

## 2024-03-05 ENCOUNTER — Encounter: Payer: Self-pay | Admitting: Family Medicine

## 2024-03-05 VITALS — BP 116/75 | HR 82 | Temp 96.6°F | Ht 70.0 in | Wt 256.4 lb

## 2024-03-05 DIAGNOSIS — Z125 Encounter for screening for malignant neoplasm of prostate: Secondary | ICD-10-CM | POA: Diagnosis not present

## 2024-03-05 DIAGNOSIS — M1A9XX0 Chronic gout, unspecified, without tophus (tophi): Secondary | ICD-10-CM

## 2024-03-05 DIAGNOSIS — E1159 Type 2 diabetes mellitus with other circulatory complications: Secondary | ICD-10-CM

## 2024-03-05 DIAGNOSIS — E1165 Type 2 diabetes mellitus with hyperglycemia: Secondary | ICD-10-CM | POA: Diagnosis not present

## 2024-03-05 DIAGNOSIS — N529 Male erectile dysfunction, unspecified: Secondary | ICD-10-CM | POA: Insufficient documentation

## 2024-03-05 DIAGNOSIS — E785 Hyperlipidemia, unspecified: Secondary | ICD-10-CM | POA: Diagnosis not present

## 2024-03-05 DIAGNOSIS — Z794 Long term (current) use of insulin: Secondary | ICD-10-CM | POA: Diagnosis not present

## 2024-03-05 DIAGNOSIS — I152 Hypertension secondary to endocrine disorders: Secondary | ICD-10-CM

## 2024-03-05 LAB — COMPREHENSIVE METABOLIC PANEL WITH GFR
ALT: 33 U/L (ref 0–53)
AST: 22 U/L (ref 0–37)
Albumin: 4.5 g/dL (ref 3.5–5.2)
Alkaline Phosphatase: 92 U/L (ref 39–117)
BUN: 18 mg/dL (ref 6–23)
CO2: 29 meq/L (ref 19–32)
Calcium: 9.5 mg/dL (ref 8.4–10.5)
Chloride: 100 meq/L (ref 96–112)
Creatinine, Ser: 0.78 mg/dL (ref 0.40–1.50)
GFR: 93.88 mL/min (ref >60.00)
Glucose, Bld: 180 mg/dL — ABNORMAL HIGH (ref 70–99)
Potassium: 4.2 meq/L (ref 3.5–5.1)
Sodium: 138 meq/L (ref 135–145)
Total Bilirubin: 0.4 mg/dL (ref 0.2–1.2)
Total Protein: 6.8 g/dL (ref 6.0–8.3)

## 2024-03-05 LAB — CBC
HCT: 41.7 % (ref 39.0–52.0)
Hemoglobin: 14.2 g/dL (ref 13.0–17.0)
MCHC: 34.2 g/dL (ref 30.0–36.0)
MCV: 80.7 fl (ref 78.0–100.0)
Platelets: 205 10*3/uL (ref 150.0–400.0)
RBC: 5.17 Mil/uL (ref 4.22–5.81)
RDW: 13.1 % (ref 11.5–15.5)
WBC: 7.2 10*3/uL (ref 4.0–10.5)

## 2024-03-05 LAB — MICROALBUMIN / CREATININE URINE RATIO
Creatinine,U: 190.5 mg/dL
Microalb Creat Ratio: 95.8 mg/g — ABNORMAL HIGH (ref 0.0–30.0)
Microalb, Ur: 18.3 mg/dL — ABNORMAL HIGH (ref 0.0–1.9)

## 2024-03-05 LAB — LIPID PANEL
Cholesterol: 121 mg/dL (ref 0–200)
HDL: 29.4 mg/dL — ABNORMAL LOW (ref >39.00)
LDL Cholesterol: 16 mg/dL (ref 0–99)
NonHDL: 91.72
Total CHOL/HDL Ratio: 4
Triglycerides: 377 mg/dL — ABNORMAL HIGH (ref 0.0–149.0)
VLDL: 75.4 mg/dL — ABNORMAL HIGH (ref 0.0–40.0)

## 2024-03-05 LAB — TSH: TSH: 1.62 u[IU]/mL (ref 0.35–5.50)

## 2024-03-05 LAB — HEMOGLOBIN A1C: Hgb A1c MFr Bld: 7.9 % — ABNORMAL HIGH (ref 4.6–6.5)

## 2024-03-05 LAB — PSA: PSA: 0.61 ng/mL (ref 0.10–4.00)

## 2024-03-05 LAB — URIC ACID: Uric Acid, Serum: 5.1 mg/dL (ref 4.0–7.8)

## 2024-03-05 MED ORDER — TADALAFIL 10 MG PO TABS: 5.0000 mg | ORAL_TABLET | ORAL | 5 refills | Status: DC | PRN

## 2024-03-05 NOTE — Assessment & Plan Note (Signed)
>>  ASSESSMENT AND PLAN FOR HYPERLIPIDEMIA WRITTEN ON 03/05/2024  2:03 PM BY Margrit Minner M, MD  On Crestor  40 mg daily per cardiology.  Also along with Vascepa  1 g daily.  Check labs today.

## 2024-03-05 NOTE — Patient Instructions (Signed)
 It was very nice to see you today!  We will check blood work today  Please try the Cialis.  Let us  know if you have any side effects.  Follow-up with me 1 to 2 weeks to let us  know how this is working for you.  We can refer you to see the urologist if this does not work well for you.  Return in about 6 months (around 09/04/2024) for Annual Physical.   Take care, Dr Daneil Dunker  PLEASE NOTE:  If you had any lab tests, please let us  know if you have not heard back within a few days. You may see your results on mychart before we have a chance to review them but we will give you a call once they are reviewed by us .   If we ordered any referrals today, please let us  know if you have not heard from their office within the next week.   If you had any urgent prescriptions sent in today, please check with the pharmacy within an hour of our visit to make sure the prescription was transmitted appropriately.   Please try these tips to maintain a healthy lifestyle:  Eat at least 3 REAL meals and 1-2 snacks per day.  Aim for no more than 5 hours between eating.  If you eat breakfast, please do so within one hour of getting up.   Each meal should contain half fruits/vegetables, one quarter protein, and one quarter carbs (no bigger than a computer mouse)  Cut down on sweet beverages. This includes juice, soda, and sweet tea.   Drink at least 1 glass of water with each meal and aim for at least 8 glasses per day  Exercise at least 150 minutes every week.

## 2024-03-05 NOTE — Assessment & Plan Note (Signed)
 Stable.  No recent flares.  He is on allopurinol  300 mg daily.  He is interested in possibly decreasing the dose.  If uric acid level is at goal would consider decreasing to 100 to 200 mg daily.

## 2024-03-05 NOTE — Assessment & Plan Note (Signed)
 A1c improved significantly at his last visit here however this was over a year ago.  We did discuss importance of good glycemic control.  This may be contributing to his erectile dysfunction as well.  He is currently on Mounjaro  7.5 mg weekly and metformin  1000 g twice daily.  Will check A1c today.  Follow-up in 3 to 6 months depending on today's result.

## 2024-03-05 NOTE — Assessment & Plan Note (Signed)
 On metoprolol  succinate 50 mg daily.

## 2024-03-05 NOTE — Assessment & Plan Note (Signed)
 Multifactorial in setting of colon cancer and history of poorly controlled diabetes.  We are managing his comorbidities as below though did discuss potential treatment options for his erectile dysfunction.  Will try Cialis 5 to 10 mg daily.  We discussed potential side effects.  He will follow-up with us  in a week or 2 via MyChart.  If he does not have much success with this would consider trial of Viagra versus referral to urology.

## 2024-03-05 NOTE — Assessment & Plan Note (Signed)
 On Crestor  40 mg daily per cardiology.  Also along with Vascepa  1 g daily.  Check labs today.

## 2024-03-05 NOTE — Progress Notes (Signed)
   John Hubbard is a 63 y.o. male who presents today for an office visit.  Assessment/Plan:  Chronic Problems Addressed Today: Erectile dysfunction Multifactorial in setting of colon cancer and history of poorly controlled diabetes.  We are managing his comorbidities as below though did discuss potential treatment options for his erectile dysfunction.  Will try Cialis 5 to 10 mg daily.  We discussed potential side effects.  He will follow-up with us  in a week or 2 via MyChart.  If he does not have much success with this would consider trial of Viagra versus referral to urology.  T2DM (type 2 diabetes mellitus) (HCC) A1c improved significantly at his last visit here however this was over a year ago.  We did discuss importance of good glycemic control.  This may be contributing to his erectile dysfunction as well.  He is currently on Mounjaro  7.5 mg weekly and metformin  1000 g twice daily.  Will check A1c today.  Follow-up in 3 to 6 months depending on today's result.  Hypertension associated with diabetes (HCC) On metoprolol  succinate 50 mg daily.  Chronic gout involving toe without tophus Stable.  No recent flares.  He is on allopurinol  300 mg daily.  He is interested in possibly decreasing the dose.  If uric acid level is at goal would consider decreasing to 100 to 200 mg daily.  Hyperlipidemia On Crestor  40 mg daily per cardiology.  Also along with Vascepa  1 g daily.  Check labs today.     Subjective:  HPI:  See assessment / plan for status of chronic conditions.  I last saw him over a year ago.   at that time we switched his Ozempic  to Mounjaro  and continue metformin .  Since his last visit here, he has followed with GI and oncology for colon cancer.  He also injured his right ankle about a year ago and has been following with orthopedics for this as well.   He is here today to discuss erectile dysfunction. This has been progressively the last few years. He is not getting any  erections at night or in the morning. Symptoms have worsened since being treated for colon cancer a few years ago.  He is concerned that he may have had nerve damage related to treatment for the colon cancer.  He does not have any urinary symptoms.  No pain.       Objective:  Physical Exam: BP 116/75   Pulse 82   Temp (!) 96.6 F (35.9 C) (Temporal)   Ht 5\' 10"  (1.778 m)   Wt 256 lb 6.4 oz (116.3 kg)   SpO2 97%   BMI 36.79 kg/m   Gen: No acute distress, resting comfortably Neuro: Grossly normal, moves all extremities Psych: Normal affect and thought content      Patric Buckhalter M. Daneil Dunker, MD 03/05/2024 2:03 PM

## 2024-03-06 ENCOUNTER — Ambulatory Visit: Payer: Self-pay | Admitting: Family Medicine

## 2024-03-06 ENCOUNTER — Ambulatory Visit: Payer: Federal, State, Local not specified - PPO | Admitting: Oncology

## 2024-03-06 NOTE — Progress Notes (Signed)
 His A1c is up a little bit.  It would be a good idea for us  to increase his Mounjaro  to improve his A1c.  Recommend increasing to 10 mg weekly.  Please send in new prescription if he is agreeable.  We should recheck in 3 months.  Here his uric acid level is at goal.  It is okay to decrease his allopurinol  to 100 mg daily.  We should recheck this in 3 to 6 months.  Please send new prescription.  He has a moderate amount of protein in his urine.  This is due to the diabetes.  I hope this will improve if we can continue to improve his sugar management.  We should recheck this in 3 months.  The rest of his labs are all at goal and we can recheck in a year.

## 2024-03-07 ENCOUNTER — Other Ambulatory Visit: Payer: Self-pay | Admitting: *Deleted

## 2024-03-07 ENCOUNTER — Telehealth: Payer: Self-pay | Admitting: *Deleted

## 2024-03-07 MED ORDER — TIRZEPATIDE 10 MG/0.5ML ~~LOC~~ SOAJ: 10.0000 mg | SUBCUTANEOUS | 0 refills | Status: DC

## 2024-03-07 MED ORDER — ALLOPURINOL 100 MG PO TABS: 100.0000 mg | ORAL_TABLET | Freq: Every day | ORAL | 6 refills | Status: DC

## 2024-03-07 NOTE — Telephone Encounter (Signed)
 Copied from CRM [id number]. Topic: Clinical - Lab/Test Results >> Mar 06, 2024  4:51 PM Crispin Dolphin wrote: Reason for CRM: Patient returned missed call. Call for lab results. Attempted to read note as written. Patient reviewed labs on MyChart but has not seen note yet. Not showing increase for Mounjaro  was to pharmacy. Thank You   See results note  Hershy Flenner,RMA

## 2024-03-13 ENCOUNTER — Ambulatory Visit: Payer: Federal, State, Local not specified - PPO | Admitting: Oncology

## 2024-03-21 ENCOUNTER — Ambulatory Visit: Admitting: Oncology

## 2024-03-28 ENCOUNTER — Ambulatory Visit: Admitting: Internal Medicine

## 2024-04-02 ENCOUNTER — Other Ambulatory Visit: Payer: Self-pay | Admitting: Family Medicine

## 2024-04-02 DIAGNOSIS — M1A9XX0 Chronic gout, unspecified, without tophus (tophi): Secondary | ICD-10-CM

## 2024-04-05 ENCOUNTER — Other Ambulatory Visit: Payer: Self-pay | Admitting: Internal Medicine

## 2024-04-08 ENCOUNTER — Other Ambulatory Visit: Payer: Self-pay | Admitting: Family Medicine

## 2024-04-08 ENCOUNTER — Telehealth: Payer: Self-pay | Admitting: Family Medicine

## 2024-04-08 ENCOUNTER — Other Ambulatory Visit: Payer: Self-pay | Admitting: *Deleted

## 2024-04-08 MED ORDER — TIRZEPATIDE 10 MG/0.5ML ~~LOC~~ SOAJ: 10.0000 mg | SUBCUTANEOUS | 0 refills | Status: DC

## 2024-04-08 NOTE — Telephone Encounter (Signed)
 Prescription Request  04/08/2024  LOV: 03/05/2024  What is the name of the medication or equipment? tirzepatide  (MOUNJARO ) 10 MG/0.5ML Pen   Have you contacted your pharmacy to request a refill? Yes   Which pharmacy would you like this sent to?    [id number] [place] [place] - [address] Phone: [phone]  Fax: [phone]      Patient notified that their request is being sent to the clinical staff for review and that they should receive a response within 2 business days.   Please advise at Mobile [phone] (mobile)

## 2024-04-08 NOTE — Telephone Encounter (Signed)
 [facility]

## 2024-04-08 NOTE — Telephone Encounter (Signed)
 Copied from CRM [id number]. Topic: Clinical - Medication Refill >> Apr 08, 2024 10:01 AM Robinson H wrote: Medication: tirzepatide  (MOUNJARO ) 10 MG/0.5ML Pen  Has the patient contacted their pharmacy? Yes, need provider to approve (Agent: If no, request that the patient contact the pharmacy for the refill. If patient does not wish to contact the pharmacy document the reason why and proceed with request.) (Agent: If yes, when and what did the pharmacy advise?)  This is the patient's preferred pharmacy:  [id number], [place] - [address] [place] [address] Phone: [phone] Fax: [phone]    Is this the correct pharmacy for this prescription? Yes If no, delete pharmacy and type the correct one.   Has the prescription been filled recently? No  Is the patient out of the medication? Yes  Has the patient been seen for an appointment in the last year OR does the patient have an upcoming appointment? Yes  Can we respond through MyChart? Yes  Agent: Please be advised that Rx refills may take up to 3 business days. We ask that you follow-up with your pharmacy.

## 2024-04-08 NOTE — Telephone Encounter (Signed)
 Copied from CRM [id number]. Topic: General - Other >> Apr 08, 2024 10:06 AM Robinson H wrote: Reason for CRM: Patient states he was supposed to get back with Dr. Kennyth in regards to one of his medications, states no positive results with the tadalafil  (CIALIS ) 10 MG tablet, states he took it 3 nights while out to town and didn't have any results.  [place] [phone]    Please advise  Elora KRAFT

## 2024-04-09 ENCOUNTER — Other Ambulatory Visit: Payer: Self-pay | Admitting: *Deleted

## 2024-04-09 ENCOUNTER — Telehealth: Payer: Self-pay | Admitting: Family Medicine

## 2024-04-09 MED ORDER — TADALAFIL 20 MG PO TABS: 10.0000 mg | ORAL_TABLET | ORAL | 11 refills | Status: DC | PRN

## 2024-04-09 NOTE — Telephone Encounter (Signed)
 HE can try taking 20 mg Cialis  which is the max dose. He should let us  know if no improvement with this.  Worth HERO. Kennyth, MD 04/09/2024 7:42 AM

## 2024-04-09 NOTE — Telephone Encounter (Signed)
 See previews note

## 2024-04-09 NOTE — Telephone Encounter (Signed)
 New prescription Cialis  20mg  send to pharmacy

## 2024-04-09 NOTE — Telephone Encounter (Signed)
 Copied from CRM [id number]. Topic: Clinical - Medication Question >> Apr 09, 2024 11:48 AM Drema MATSU wrote: Reason for CRM: Patient stated that he is willing to try 20mg  of CIALIS .

## 2024-04-09 NOTE — Telephone Encounter (Signed)
 Left message to return call to our office at their convenience.

## 2024-04-12 ENCOUNTER — Inpatient Hospital Stay: Attending: Oncology | Admitting: Oncology

## 2024-04-12 VITALS — BP 133/73 | HR 80 | Temp 97.8°F | Resp 18 | Ht 70.0 in | Wt 261.4 lb

## 2024-04-12 DIAGNOSIS — G620 Drug-induced polyneuropathy: Secondary | ICD-10-CM | POA: Insufficient documentation

## 2024-04-12 DIAGNOSIS — F419 Anxiety disorder, unspecified: Secondary | ICD-10-CM | POA: Diagnosis not present

## 2024-04-12 DIAGNOSIS — T451X5D Adverse effect of antineoplastic and immunosuppressive drugs, subsequent encounter: Secondary | ICD-10-CM | POA: Insufficient documentation

## 2024-04-12 DIAGNOSIS — C189 Malignant neoplasm of colon, unspecified: Secondary | ICD-10-CM

## 2024-04-12 DIAGNOSIS — E785 Hyperlipidemia, unspecified: Secondary | ICD-10-CM | POA: Insufficient documentation

## 2024-04-12 DIAGNOSIS — Z9221 Personal history of antineoplastic chemotherapy: Secondary | ICD-10-CM | POA: Diagnosis not present

## 2024-04-12 DIAGNOSIS — Z85038 Personal history of other malignant neoplasm of large intestine: Secondary | ICD-10-CM | POA: Insufficient documentation

## 2024-04-12 DIAGNOSIS — I10 Essential (primary) hypertension: Secondary | ICD-10-CM | POA: Insufficient documentation

## 2024-04-12 DIAGNOSIS — M109 Gout, unspecified: Secondary | ICD-10-CM | POA: Insufficient documentation

## 2024-04-12 NOTE — Progress Notes (Signed)
  Cancer Center OFFICE PROGRESS NOTE   Diagnosis: Colon cancer  INTERVAL HISTORY:   John Hubbard returns as scheduled.  He feels well.  He underwent a colonoscopy in August 2024.  Polyps were removed from the rectum and ascending colon.  The pathology revealed a hyperplastic polyp with prolapse effect. He reports minimal remaining neuropathy symptoms.  This does not interfere with activity.   Objective:  Vital signs in last 24 hours:  Blood pressure 133/73, pulse 80, temperature 97.8 F (36.6 C), temperature source Temporal, resp. rate 18, height 5' 10 (1.778 m), weight 261 lb 6.4 oz (118.6 kg), SpO2 98%.     Lymphatics: No cervical, supraclavicular, axillary, or inguinal nodes Resp: Lungs clear bilaterally Cardio: Regular rate and rhythm GI: No hepatosplenomegaly, no mass, nontender Vascular: No leg edema   Lab Results:  Lab Results  Component Value Date   WBC 7.2 03/05/2024   HGB 14.2 03/05/2024   HCT 41.7 03/05/2024   MCV 80.7 03/05/2024   PLT 205.0 03/05/2024   NEUTROABS 3.9 10/28/2016    CMP  Lab Results  Component Value Date   NA 138 03/05/2024   K 4.2 03/05/2024   CL 100 03/05/2024   CO2 29 03/05/2024   GLUCOSE 180 (H) 03/05/2024   BUN 18 03/05/2024   CREATININE 0.78 03/05/2024   CALCIUM  9.5 03/05/2024   PROT 6.8 03/05/2024   ALBUMIN 4.5 03/05/2024   AST 22 03/05/2024   ALT 33 03/05/2024   ALKPHOS 92 03/05/2024   BILITOT 0.4 03/05/2024   GFRNONAA >60 06/21/2022   GFRAA >60 04/16/2019    Lab Results  Component Value Date   CEA1 3.75 04/16/2019   CEA 2.1 08/15/2014     Medications: I have reviewed the patient's current medications.   Assessment/Plan:  Stage III (T3 N1) poorly differentiated adenocarcinoma of the sigmoid colon status post low anterior resection 08/14/2013. The tumor returned microsatellite stable with no loss of mismatch repair protein expression. Cycle 1 adjuvant CAPOX beginning 09/18/2013.   Cycle 7 CAPOX  01/22/2014   Cycle 8 CAPOX 02/12/2014 (oxaliplatin  deleted). Surveillance colonoscopy January 2016 CT scans chest, abdomen and pelvis 08/15/2014 with no evidence of metastatic disease. CT scans 11/15/2016-interval development of mediastinal adenopathy. Enlarging nodule within right upper lobe. No mass or adenopathy identified within the abdomen or pelvis. PET scan 12/05/2016-FDG avid seen in the mediastinum and hila. Nodule in the medial right upper lobe increased since 2015, no abnormal FDG uptake in the region of the nodule. Epicardial node inferior to the liver dome FDG avid. Porta hepatis and portacaval nodes FDG avid as well. Also FDG avid node anterior to the right side of the crus. EUS biopsy of a portacaval node on 12/15/2016-nondiagnostic Bronchoscopy/mediastinoscopy 12/28/2016-biopsies of level 7 and leve l4R nodes negative for malignancy. Changes consistent with a fibrotic lymph node-potentially old granulomas identified on the level 7 node CT chest 04/16/2019-resolution of mediastinal adenopathy, decrease in size of right upper lobe subpleural nodule, morphologic features of cirrhosis Colonoscopy 06/14/2019-negative Colonoscopy 05/10/2023: Polyps removed from the rectum and descending colon-hyperplastic polyp Gout. He continues allopurinol . History of anxiety maintained on Tegretol . Hypertension. History of delayed nausea. Improved with Aloxi /Emend Rash-the rash occurred following cycle 7 CAPOX. It is possible the rash was related to capecitabine . Improved. 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 Hyperlipidemia      Disposition: John Hubbard is in clinical remission from colon cancer.  He is now greater  than 10 years out from diagnosis.  He was discharged from the medical oncology clinic today.  He has a good prognosis for a long-term disease-free survival.  He will continue colonoscopy  surveillance with Dr. Charlanne.  I am available to see him as needed.  Arley Hof, MD  04/12/2024  10:47 AM

## 2024-06-05 ENCOUNTER — Other Ambulatory Visit: Payer: Self-pay | Admitting: Family Medicine

## 2024-06-05 ENCOUNTER — Other Ambulatory Visit: Payer: Self-pay

## 2024-06-06 MED ORDER — METOPROLOL SUCCINATE ER 25 MG PO TB24: 25.0000 mg | ORAL_TABLET | Freq: Every day | ORAL | 0 refills | Status: AC

## 2024-06-14 ENCOUNTER — Encounter: Payer: Self-pay | Admitting: Internal Medicine

## 2024-07-01 NOTE — Progress Notes (Deleted)
 Cardiology Office Note:    Date:  07/01/2024   ID:  John Hubbard, DOB Jan 13, 1961, MRN [id number]  PCP:  John Worth HERO, MD   [name] Cardiologist:  John DELENA Leavens, MD     Referring MD: John Worth HERO, MD   CC: CAD f/u   History of Present Illness:    John Hubbard is a 63 y.o. male with a hx of HTN with DM, Colon Cancer in 2014 s/p chemotherapy (CAPOX with no MI on therapy), FH, morbid obesity seen by John Hubbard in 2023.  Sees saw Dr. Mona for FH mgmt.  Has f/u in in March with Dr. Mona. 2024: established with me    Past Medical History:  Diagnosis Date   Anxiety    Bipolar disorder (HCC)    Colon cancer (HCC)    hx colon cancer, surgery, chemotherapy -last 5'15   Complication of anesthesia    woke-up during colonoscopy; difficult to wake   Diabetes mellitus without complication (HCC)    on meds   GERD (gastroesophageal reflux disease)    withcertain foods/take OTC PRN meds   Gout    History of blood transfusion 2015   Hypertension    Neuropathy    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/21/2022    Past Surgical History:  Procedure Laterality Date   COLONOSCOPY Bilateral 07/24/2013   Procedure: COLONOSCOPY;  Surgeon: John Ned, MD;  Location: [facility];  Service: Endoscopy;  Laterality: Bilateral;   COLONOSCOPY N/A 06/14/2019   Procedure: COLONOSCOPY;  Surgeon: Hubbard Bernarda, MD;  [facility];  Service: Endoscopy;  Laterality: N/A;   COLONOSCOPY WITH PROPOFOL  N/A 10/23/2014   Procedure: COLONOSCOPY WITH PROPOFOL ;  Surgeon: John Ned, MD;  [facility];  Service: Endoscopy;  Laterality: N/A;   EUS N/A 12/15/2016   Procedure: UPPER ENDOSCOPIC ULTRASOUND (EUS) LINEAR;  Surgeon: John SHAUNNA Cedar, MD;  [facility];  Service: Endoscopy;  Laterality: N/A;   HERNIA REPAIR  06/19/13   Umbilical Hernia Repair   LAPAROSCOPIC PARTIAL COLECTOMY N/A 08/14/2013   Procedure: LAPAROSCOPIC PARTIAL COLECTOMY wound vac  placement;  Surgeon: John Ned, MD;  [facility]: [facility];  Service: General;  Laterality: N/A;   MEDIASTINOSCOPY N/A 12/28/2016   Procedure: MEDIASTINOSCOPY;  Surgeon: John KATHEE Jude, MD;  Location: [facility] OR;  Service: Thoracic;  Laterality: N/A;   PORT-A-CATH REMOVAL N/A 05/14/2014   Procedure: REMOVAL PORT-A-CATH;  Surgeon: John Ned, MD;  Location: [facility];  Service: General;  Laterality: N/A;   PORTACATH PLACEMENT Left 09/16/2013   Procedure: INSERTION PORT-A-CATH;  Surgeon: John Ned, MD;  Location: [facility];  Service: General;  Laterality: Left;  dressing change on lower abdominal wound   VIDEO BRONCHOSCOPY WITH ENDOBRONCHIAL ULTRASOUND N/A 12/28/2016   Procedure: VIDEO BRONCHOSCOPY WITH ENDOBRONCHIAL ULTRASOUND;  Surgeon: John KATHEE Jude, MD;  Location: [facility] OR;  Service: Thoracic;  Laterality: N/A;    Current Medications: No outpatient medications have been marked as taking for the 07/02/24 encounter (Appointment) with Hubbard John DELENA, MD.   Current Facility-Administered Medications for the 07/02/24 encounter (Appointment) with Hubbard John DELENA, MD  Medication   0.9 %  sodium chloride  infusion     Allergies:   Penicillins   Social History   Socioeconomic History   Marital status: Married    Spouse name: Not on file   Number of children: Not on file   Years of education: Not on file   Highest education level: Not on file  Occupational History  Not on file  Tobacco Use   Smoking status: Never   Smokeless tobacco: Never  Vaping Use   Vaping status: Never Used  Substance and Sexual Activity   Alcohol use: Yes    Alcohol/week: 1.0 standard drink of alcohol    Types: 1 Standard drinks or equivalent per week    Comment: occasional 1/wk   Drug use: No   Sexual activity: Yes  Other Topics Concern   Not on file  Social History Narrative   Not on file   Social Drivers of Health   Financial Resource Strain: Not on file  Food  Insecurity: Not on file  Transportation Needs: Not on file  Physical Activity: Not on file  Stress: Not on file  Social Connections: Not on file    Social: John Hubbard comes to visit  Family History: The patient's family history includes Colon polyps (age of onset: 58) in his brother; Heart disease in his father. There is no history of Colon cancer, Esophageal cancer, Rectal cancer, or Stomach cancer.  ROS:   Please see the history of present illness.     All other systems reviewed and are negative.  Studies Reviewed:      Cardiac Studies & Procedures   ______________________________________________________________________________________________   STRESS TESTS  MYOCARDIAL PERFUSION IMAGING 11/19/2020  Interpretation Summary  The left ventricular ejection fraction is normal (55-65%).  Nuclear stress EF: 57%. No wall motion abnormality  There was no ST segment deviation noted during stress.  Defect 1: There is a small defect present in the apical inferior and apical lateral location. This apical inferolateral defect is partially reversible, mild ischemia may be present.  This is a low risk study.  John Parchment, MD            ______________________________________________________________________________________________       Recent Labs: 03/05/2024: ALT 33; BUN 18; Creatinine, Ser 0.78; Hemoglobin 14.2; Platelets 205.0; Potassium 4.2; Sodium 138; TSH 1.62  Recent Lipid Panel    Component Value Date/Time   CHOL 121 03/05/2024 1359   CHOL 127 06/12/2023 1038   TRIG 377.0 (H) 03/05/2024 1359   HDL 29.40 (L) 03/05/2024 1359   HDL 28 (L) 06/12/2023 1038   CHOLHDL 4 03/05/2024 1359   VLDL 75.4 (H) 03/05/2024 1359   LDLCALC 16 03/05/2024 1359   LDLCALC 50 06/12/2023 1038   LDLCALC  09/17/2020 1543     Comment:     . LDL cholesterol not calculated. Triglyceride levels greater than 400 mg/dL invalidate calculated LDL results. . Reference range: <100 . Desirable  range <100 mg/dL for primary prevention;   <70 mg/dL for patients with CHD or diabetic patients  with > or = 2 CHD risk factors. SABRA LDL-C is now calculated using the Martin-Hopkins  calculation, which is a validated novel method providing  better accuracy than the Friedewald equation in the  estimation of LDL-C.  Gladis APPLETHWAITE et al. SANDREA. 7986;689(80): 2061-2068  (http://education.Quest[employer].com/faq/FAQ164)    LDLDIRECT 49.0 12/06/2021 1532        Physical Exam:    VS:  There were no vitals taken for this visit.    Wt Readings from Last 3 Encounters:  04/12/24 261 lb 6.4 oz (118.6 kg)  03/05/24 256 lb 6.4 oz (116.3 kg)  06/14/23 254 lb (115.2 kg)    GEN:  Well nourished, well developed in no acute distress HEENT: Normal NECK: No JVD CARDIAC: RRR, no murmurs, rubs, gallops RESPIRATORY:  Clear to auscultation without rales, wheezing or rhonchi  ABDOMEN: Soft,  non-tender, non-distended MUSCULOSKELETAL:  No edema; No deformity  SKIN: Warm and dry NEUROLOGIC:  Alert and oriented x 3 PSYCHIATRIC:  Normal affect    ASSESSEMENT/PLAN:    HTN with DM Mild non obstructive CAD (Apical perfusion defect)  SR with 1st HB - started Mounjaro  with PCP - continue ASA 81 mg - start amb BP monitoring - decrease succinate to 25 mg; IF home BP + 135/85 or return of CP will increase dose  FH and Hypertriglyceridemia - statin and vascepa  - fasting lipids today for his follow up with Dr. Mona - discussed diet interventions focused on sweet consumption      John Leavens, MD FASE [facility] Cardiologist [facility]  [address] [place], [place] [address] [phone]  7:44 AM

## 2024-07-02 ENCOUNTER — Ambulatory Visit: Admitting: Internal Medicine

## 2024-07-04 ENCOUNTER — Other Ambulatory Visit: Payer: Self-pay | Admitting: Family Medicine

## 2024-07-05 NOTE — Telephone Encounter (Signed)
 Reported as patient not taking, okay to send? Please advise.

## 2024-07-08 ENCOUNTER — Telehealth: Payer: Self-pay | Admitting: *Deleted

## 2024-07-08 ENCOUNTER — Other Ambulatory Visit (HOSPITAL_COMMUNITY): Payer: Self-pay

## 2024-07-08 ENCOUNTER — Telehealth: Payer: Self-pay

## 2024-07-08 NOTE — Telephone Encounter (Signed)
 Copied from CRM #[id number]. Topic: Clinical - Medication Prior Auth >> Jul 08, 2024  9:51 AM Robinson H wrote: Reason for CRM: [facility] for his tirzepatide  (MOUNJARO ) 10 MG/0.5ML Pen. Patient states he didn't take his dose for this past weekend.  [place] [phone]   Please start a new PA Elora RMA

## 2024-07-08 NOTE — Telephone Encounter (Signed)
 error

## 2024-07-08 NOTE — Telephone Encounter (Signed)
 LVM Rx tirzepatide  (MOUNJARO ) 10 MG/0.5ML Pen. Approved till 07/08/2025

## 2024-07-08 NOTE — Telephone Encounter (Signed)
 Pharmacy Patient Advocate Encounter  Received notification from [facility] that Prior Authorization for Mounjaro  10MG /0.5ML Auto-injectors has been APPROVED from 07/08/24 to 07/08/25   PA #/Case ID/Reference #: [id number]

## 2024-07-08 NOTE — Telephone Encounter (Signed)
 Can we clarify what dose he is currently taking? We increased it to 10 mg a few months ago.

## 2024-07-09 ENCOUNTER — Telehealth: Payer: Self-pay

## 2024-07-09 ENCOUNTER — Other Ambulatory Visit: Payer: Self-pay | Admitting: Family Medicine

## 2024-07-09 ENCOUNTER — Other Ambulatory Visit (HOSPITAL_COMMUNITY): Payer: Self-pay

## 2024-07-09 NOTE — Telephone Encounter (Signed)
 Copied from CRM [id number]. Topic: Clinical - Medication Question >> Jul 09, 2024  9:09 AM Revonda D wrote: Reason for CRM: Pt stated that he has missed his dose for the tirzepatide  (MOUNJARO ) 10 MG/0.5ML Pen over the past weekend and needs to get this refilled as soon as possible. Pt would like for the medication to be approved and sent to the pharmacy today. Pt would like a callback with an update.

## 2024-07-10 NOTE — Telephone Encounter (Signed)
 LVM to Rx was send on 07/09/2024  Rx was approved by your insurance till 07/08/2024 Please give us  a call if any issue with Rx

## 2024-08-01 ENCOUNTER — Other Ambulatory Visit (HOSPITAL_COMMUNITY): Payer: Self-pay

## 2024-08-02 ENCOUNTER — Other Ambulatory Visit (HOSPITAL_COMMUNITY): Payer: Self-pay

## 2024-09-02 ENCOUNTER — Ambulatory Visit: Attending: Internal Medicine | Admitting: Internal Medicine

## 2024-09-02 VITALS — BP 134/74 | HR 75 | Ht 69.0 in | Wt 258.0 lb

## 2024-09-02 DIAGNOSIS — E785 Hyperlipidemia, unspecified: Secondary | ICD-10-CM

## 2024-09-02 DIAGNOSIS — I10 Essential (primary) hypertension: Secondary | ICD-10-CM

## 2024-09-02 DIAGNOSIS — E119 Type 2 diabetes mellitus without complications: Secondary | ICD-10-CM

## 2024-09-02 NOTE — Progress Notes (Signed)
 Cardiology Office Note:    Date:  09/02/2024   ID:  Rockey JONELLE Greet, DOB August 31, 1961, MRN [id number]  PCP:  Kennyth Worth HERO, MD   [facility] Providers Cardiologist:  Stanly DELENA Leavens, MD     Referring MD: Kennyth Worth HERO, MD   CC: Secondary prevention  History of Present Illness:    John Hubbard is a 63 y.o. male with a hx of HTN with DM, Colon Cancer in 2014 s/p chemotherapy (CAPOX with no MI on therapy), FH, morbid obesity seen by Dr. Claudene in 2023.  Sees saw Dr. Mona for FH mgmt.  Has f/u in in March with Dr. Mona. 2024: Established with me; had mechanical fall.  Mr. Zelman is a 63 year old male with hypertension, diabetes, and familial hypercholesterolemia who presents for secondary prevention and management of these conditions.  He experiences fatigue, which he attributes to beta blocker use. He has a history of sinus rhythm and first-degree heart block, leading to a reduction in beta blocker therapy. He feels less energetic and sometimes naps during the day. He plans to resume his exercise routine, which he believes helps with his energy levels.  A stress test showed a very mild defect at the tip of his heart. A stress test showed a very mild defect at the tip of his heart, but he is currently asymptomatic and not experiencing any chest discomfort.  His blood pressure has decreased from 154 mmHg over time to 134/74 mmHg, consistent with previous readings.  He has a history of colon cancer treated with cardiotoxic chemotherapy, familial hypercholesterolemia, and morbid obesity. His cholesterol levels are well-managed, and he has been praised for his dietary efforts.  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 He mentions being off his exercise routine due to family issues but plans to return to it. He is interested in joining a gym to support his fitness goals.   Past Medical History:   Diagnosis Date   Anxiety    Bipolar disorder (HCC)    Colon cancer (HCC)    hx colon cancer, surgery, chemotherapy -last 5'15   Complication of anesthesia    woke-up during colonoscopy; difficult to wake   Diabetes mellitus without complication (HCC)    on meds   GERD (gastroesophageal reflux disease)    withcertain foods/take OTC PRN meds   Gout    History of blood transfusion 2015   Hypertension    Neuropathy    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/21/2022    Past Surgical History:  Procedure Laterality Date   COLONOSCOPY Bilateral 07/24/2013   Procedure: COLONOSCOPY;  Surgeon: Bernarda Ned, MD;  [facility];  Service: Endoscopy;  Laterality: Bilateral;   COLONOSCOPY N/A 06/14/2019   Procedure: COLONOSCOPY;  Surgeon: Ned Bernarda, MD;  [facility];  Service: Endoscopy;  Laterality: N/A;   COLONOSCOPY WITH PROPOFOL  N/A 10/23/2014   Procedure: COLONOSCOPY WITH PROPOFOL ;  Surgeon: Bernarda Ned, MD;  [facility];  Service: Endoscopy;  Laterality: N/A;   EUS N/A 12/15/2016   Procedure: UPPER ENDOSCOPIC ULTRASOUND (EUS) LINEAR;  Surgeon: Toribio SHAUNNA Cedar, MD;  [facility];  Service: Endoscopy;  Laterality: N/A;   HERNIA REPAIR  06/19/13   Umbilical Hernia Repair   LAPAROSCOPIC PARTIAL COLECTOMY N/A 08/14/2013   Procedure: LAPAROSCOPIC PARTIAL COLECTOMY wound vac placement;  Surgeon: Bernarda Ned, MD;  [facility];  Service: General;  Laterality: N/A;   MEDIASTINOSCOPY  N/A 12/28/2016   Procedure: MEDIASTINOSCOPY;  Surgeon: Dallas KATHEE Jude, MD;  Location: [facility] OR;  Service: Thoracic;  Laterality: N/A;   PORT-A-CATH REMOVAL N/A 05/14/2014   Procedure: REMOVAL PORT-A-CATH;  Surgeon: Bernarda Ned, MD;  Location: [facility];  Service: General;  Laterality: N/A;   PORTACATH PLACEMENT Left 09/16/2013   Procedure: INSERTION PORT-A-CATH;  Surgeon: Bernarda Ned, MD;  Location: [facility];  Service: General;  Laterality: Left;  dressing change  on lower abdominal wound   VIDEO BRONCHOSCOPY WITH ENDOBRONCHIAL ULTRASOUND N/A 12/28/2016   Procedure: VIDEO BRONCHOSCOPY WITH ENDOBRONCHIAL ULTRASOUND;  Surgeon: Dallas KATHEE Jude, MD;  Location: [facility];  Service: Thoracic;  Laterality: N/A;    Current Medications: Current Meds  Medication Sig   ACCU-CHEK GUIDE TEST test strip USE 1 STRIP AS DIRECTED ONCE DAILY   allopurinol  (ZYLOPRIM ) 100 MG tablet Take 1 tablet (100 mg total) by mouth daily.   aspirin  EC 81 MG tablet Take 1 tablet (81 mg total) by mouth daily. Swallow whole.   blood glucose meter kit and supplies KIT Dispense based on patient and insurance preference. Use up to four times daily as directed.   carbamazepine  (TEGRETOL ) 200 MG tablet TAKE 2 TABLETS BY MOUTH AT BEDTIME   Coenzyme Q10 (COQ10) 100 MG CAPS Take 100 mg by mouth daily.   icosapent  Ethyl (VASCEPA ) 1 g capsule TAKE 2 CAPSULES (2 GRAM) BY MOUTH TWICE DAILY. DR. MONA RECOMMENDED AT LEAST 3-4 GRAM DAILY.   metFORMIN  (GLUCOPHAGE -XR) 500 MG 24 hr tablet TAKE 2 TABLETS (1,000 MG TOTAL) BY MOUTH 2 (TWO) TIMES DAILY WITH A MEAL.   metoprolol  succinate (TOPROL  XL) 25 MG 24 hr tablet Take 1 tablet (25 mg total) by mouth at bedtime.   Multiple Vitamins-Minerals (MULTIVITAMIN WITH MINERALS) tablet Take 1 tablet by mouth daily.   rosuvastatin  (CRESTOR ) 40 MG tablet TAKE 1 TABLET BY MOUTH EVERY DAY   tadalafil  (CIALIS ) 20 MG tablet Take 0.5-1 tablets (10-20 mg total) by mouth every other day as needed for erectile dysfunction.   tirzepatide  (MOUNJARO ) 10 MG/0.5ML Pen INJECT 10 MG INTO THE SKIN ONE TIME PER WEEK   vitamin C (ASCORBIC ACID) 500 MG tablet Take 500 mg by mouth daily.   Current Facility-Administered Medications for the 09/02/24 encounter (Office Visit) with Santo Stanly LABOR, MD  Medication   0.9 %  sodium chloride  infusion     Allergies:   Penicillins   Social History   Socioeconomic History   Marital status: Married    Spouse name: Not on file    Number of children: Not on file   Years of education: Not on file   Highest education level: Not on file  Occupational History   Not on file  Tobacco Use   Smoking status: Never   Smokeless tobacco: Never  Vaping Use   Vaping status: Never Used  Substance and Sexual Activity   Alcohol use: Yes    Alcohol/week: 1.0 standard drink of alcohol    Types: 1 Standard drinks or equivalent per week    Comment: occasional 1/wk   Drug use: No   Sexual activity: Yes  Other Topics Concern   Not on file  Social History Narrative   Not on file   Social Drivers of Health   Financial Resource Strain: Not on file  Food Insecurity: Not on file  Transportation Needs: Not on file  Physical Activity: Not on file  Stress: Not on file  Social Connections: Not on file  Social: Wife comes to visit (also my patient)  Family History: The patient's family history includes Colon polyps (age of onset: 20) in his brother; Heart disease in his father. There is no history of Colon cancer, Esophageal cancer, Rectal cancer, or Stomach cancer.  ROS:   Please see the history of present illness.     EKGs/Labs/Other Studies Reviewed:    The following studies were reviewed today:  11/10/22: SR with inferior infarct pattern and 1st HB  Cardiac Studies & Procedures   ______________________________________________________________________________________________   STRESS TESTS  MYOCARDIAL PERFUSION IMAGING 11/19/2020  Interpretation Summary  The left ventricular ejection fraction is normal (55-65%).  Nuclear stress EF: 57%. No wall motion abnormality  There was no ST segment deviation noted during stress.  Defect 1: There is a small defect present in the apical inferior and apical lateral location. This apical inferolateral defect is partially reversible, mild ischemia may be present.  This is a low risk study.  Oneil Parchment, MD             ______________________________________________________________________________________________       Recent Labs: 03/05/2024: ALT 33; BUN 18; Creatinine, Ser 0.78; Hemoglobin 14.2; Platelets 205.0; Potassium 4.2; Sodium 138; TSH 1.62  Recent Lipid Panel    Component Value Date/Time   CHOL 121 03/05/2024 1359   CHOL 127 06/12/2023 1038   TRIG 377.0 (H) 03/05/2024 1359   HDL 29.40 (L) 03/05/2024 1359   HDL 28 (L) 06/12/2023 1038   CHOLHDL 4 03/05/2024 1359   VLDL 75.4 (H) 03/05/2024 1359   LDLCALC 16 03/05/2024 1359   LDLCALC 50 06/12/2023 1038   LDLCALC  09/17/2020 1543     Comment:     . LDL cholesterol not calculated. Triglyceride levels greater than 400 mg/dL invalidate calculated LDL results. . Reference range: <100 . Desirable range <100 mg/dL for primary prevention;   <70 mg/dL for patients with CHD or diabetic patients  with > or = 2 CHD risk factors. SABRA LDL-C is now calculated using the Martin-Hopkins  calculation, which is a validated novel method providing  better accuracy than the Friedewald equation in the  estimation of LDL-C.  Gladis APPLETHWAITE et al. SANDREA. 7986;689(80): 2061-2068  (http://education.Quest[employer].com/faq/FAQ164)    LDLDIRECT 49.0 12/06/2021 1532        Physical Exam:    VS:  BP 134/74 (BP Location: Left Arm, Patient Position: Sitting, Cuff Size: Large)   Pulse 75   Ht 5' 9 (1.753 m)   Wt 258 lb (117 kg)   SpO2 95%   BMI 38.10 kg/m     Wt Readings from Last 3 Encounters:  09/02/24 258 lb (117 kg)  04/12/24 261 lb 6.4 oz (118.6 kg)  03/05/24 256 lb 6.4 oz (116.3 kg)    GEN:  NAD, Morbid obesity HEENT: Normal CARDIAC: RRR, no murmurs, rubs, gallops RESPIRATORY:  Clear to auscultation without rales, wheezing or rhonchi  ABDOMEN: Soft, non-tender, non-distended MUSCULOSKELETAL:  No edema; No deformity  SKIN: Warm and dry NEUROLOGIC:  Alert and oriented x 3 PSYCHIATRIC:  Normal affect    ASSESSMENT/PLAN:    Nonobstructive  coronary artery disease with apical perfusion defect Mild nonobstructive coronary artery disease with an apical perfusion defect. Asymptomatic with no current symptoms. Previous stress test showed a very mild defect at the apex of the heart. No aggressive testing or treatment currently indicated. - Focus on lifestyle modifications, including exercise, to manage cardiovascular health. - Monitor for any exercise-induced chest pain and investigate if it occurs. - continue  ASA for now (may consider plavix transition in the future)  First degree heart block and fatigue related to beta blocker therapy First degree heart block and fatigue potentially related to beta blocker therapy. Previous adjustment of beta blocker therapy due to fatigue and sinus rhythm. Current heart rate and blood pressure are well-managed. - Continue current beta blocker therapy as heart rate and blood pressure are stable.  Essential hypertension Blood pressure is well-controlled at 134/74 mmHg, improved from previous readings. Goal is to maintain blood pressure under 130/80 mmHg. Lifestyle modifications, including exercise, are encouraged to further reduce blood pressure. - Continue current antihypertensive regimen. - Encouraged regular exercise to aid in blood pressure management.  Familial hypercholesterolemia Cholesterol levels are well-controlled with current management. - Continue current cholesterol management regimen. - discussed strength training and cardiovascular eval  Morbid obesity Management includes lifestyle modifications. Discussion of potential muscle loss with GLP-1 therapies. Emphasis on strength training to mitigate muscle loss and support overall health. - Encouraged strength training exercises to prevent muscle loss. - Advised on gradual reintroduction to exercise routine.  One year unless angina - if he needs a letter to join the [facility] we can compose this     Longitudinal care: The evaluation and  management services provided today reflect the complexity inherent in caring for this patient, including the ongoing longitudinal relationship and management of multiple chronic conditions and/or the need for care coordination. The visit required a comprehensive assessment and management plan tailored to the patient's unique needs Time was spent addressing not only the acute concerns but also the broader context of the patient's health, including preventive care, chronic disease management, and care coordination as appropriate.  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  [address] [place], [place] [address] [phone]  9:30 AM

## 2024-09-02 NOTE — Patient Instructions (Signed)
 Medication Instructions:  NO CHANGES *If you need a refill on your cardiac medications before your next appointment, please call your pharmacy*   Follow-Up: At [facility], you and your health needs are our priority.  As part of our continuing mission to provide you with exceptional heart care, our providers are all part of one team.  This team includes your primary Cardiologist (physician) and Advanced Practice Providers or APPs (Physician Assistants and Nurse Practitioners) who all work together to provide you with the care you need, when you need it.  Your next appointment:   12 months with Dr. Santo   We recommend signing up for the patient portal called MyChart.  Sign up information is provided on this After Visit Summary.  MyChart is used to connect with patients for Virtual Visits (Telemedicine).  Patients are able to view lab/test results, encounter notes, upcoming appointments, etc.  Non-urgent messages can be sent to your provider as well.   To learn more about what you can do with MyChart, go to [email or web address].   Other Instructions

## 2024-09-03 ENCOUNTER — Ambulatory Visit: Admitting: Family Medicine

## 2024-09-03 VITALS — BP 140/80 | HR 80 | Temp 97.0°F | Ht 69.0 in | Wt 258.9 lb

## 2024-09-03 DIAGNOSIS — N529 Male erectile dysfunction, unspecified: Secondary | ICD-10-CM | POA: Diagnosis not present

## 2024-09-03 DIAGNOSIS — E1169 Type 2 diabetes mellitus with other specified complication: Secondary | ICD-10-CM

## 2024-09-03 DIAGNOSIS — I152 Hypertension secondary to endocrine disorders: Secondary | ICD-10-CM | POA: Diagnosis not present

## 2024-09-03 DIAGNOSIS — Z125 Encounter for screening for malignant neoplasm of prostate: Secondary | ICD-10-CM | POA: Diagnosis not present

## 2024-09-03 DIAGNOSIS — E1159 Type 2 diabetes mellitus with other circulatory complications: Secondary | ICD-10-CM

## 2024-09-03 DIAGNOSIS — Z7984 Long term (current) use of oral hypoglycemic drugs: Secondary | ICD-10-CM

## 2024-09-03 DIAGNOSIS — Z0001 Encounter for general adult medical examination with abnormal findings: Secondary | ICD-10-CM | POA: Diagnosis not present

## 2024-09-03 DIAGNOSIS — M1A9XX0 Chronic gout, unspecified, without tophus (tophi): Secondary | ICD-10-CM | POA: Diagnosis not present

## 2024-09-03 DIAGNOSIS — E1165 Type 2 diabetes mellitus with hyperglycemia: Secondary | ICD-10-CM

## 2024-09-03 DIAGNOSIS — Z794 Long term (current) use of insulin: Secondary | ICD-10-CM | POA: Diagnosis not present

## 2024-09-03 DIAGNOSIS — E785 Hyperlipidemia, unspecified: Secondary | ICD-10-CM | POA: Diagnosis not present

## 2024-09-03 LAB — LIPID PANEL
Cholesterol: 116 mg/dL (ref 0–200)
HDL: 29.9 mg/dL — ABNORMAL LOW (ref >39.00)
LDL Cholesterol: 21 mg/dL (ref 0–99)
NonHDL: 85.61
Total CHOL/HDL Ratio: 4
Triglycerides: 323 mg/dL — ABNORMAL HIGH (ref 0.0–149.0)
VLDL: 64.6 mg/dL — ABNORMAL HIGH (ref 0.0–40.0)

## 2024-09-03 LAB — CBC
HCT: 40.8 % (ref 39.0–52.0)
Hemoglobin: 14.2 g/dL (ref 13.0–17.0)
MCHC: 34.7 g/dL (ref 30.0–36.0)
MCV: 79.2 fl (ref 78.0–100.0)
Platelets: 186 K/uL (ref 150.0–400.0)
RBC: 5.15 Mil/uL (ref 4.22–5.81)
RDW: 12.8 % (ref 11.5–15.5)
WBC: 7.4 K/uL (ref 4.0–10.5)

## 2024-09-03 LAB — COMPREHENSIVE METABOLIC PANEL WITH GFR
ALT: 32 U/L (ref 0–53)
AST: 22 U/L (ref 0–37)
Albumin: 4.6 g/dL (ref 3.5–5.2)
Alkaline Phosphatase: 109 U/L (ref 39–117)
BUN: 15 mg/dL (ref 6–23)
CO2: 29 meq/L (ref 19–32)
Calcium: 9.5 mg/dL (ref 8.4–10.5)
Chloride: 98 meq/L (ref 96–112)
Creatinine, Ser: 0.68 mg/dL (ref 0.40–1.50)
GFR: 97.5 mL/min (ref >60.00)
Glucose, Bld: 170 mg/dL — ABNORMAL HIGH (ref 70–99)
Potassium: 4 meq/L (ref 3.5–5.1)
Sodium: 136 meq/L (ref 135–145)
Total Bilirubin: 0.4 mg/dL (ref 0.2–1.2)
Total Protein: 6.9 g/dL (ref 6.0–8.3)

## 2024-09-03 LAB — PSA: PSA: 1.88 ng/mL (ref 0.10–4.00)

## 2024-09-03 LAB — TSH: TSH: 1.51 u[IU]/mL (ref 0.35–5.50)

## 2024-09-03 LAB — URIC ACID: Uric Acid, Serum: 5.2 mg/dL (ref 4.0–7.8)

## 2024-09-03 LAB — HEMOGLOBIN A1C: Hgb A1c MFr Bld: 7.6 % — ABNORMAL HIGH (ref 4.6–6.5)

## 2024-09-03 MED ORDER — SILDENAFIL CITRATE 100 MG PO TABS: 50.0000 mg | ORAL_TABLET | Freq: Every day | ORAL | 11 refills | Status: AC | PRN

## 2024-09-03 NOTE — Assessment & Plan Note (Signed)
 Did not have much improvement with Cialis .  We will switch to Viagra .  Discussed potential side effects.  If no improvement with this will need referral to urology.

## 2024-09-03 NOTE — Assessment & Plan Note (Signed)
 Discussed lifestyle modifications.  He is on Crestor  40 mg daily and Vascepa  1 g daily.  Tolerating well.

## 2024-09-03 NOTE — Assessment & Plan Note (Signed)
 Check uric acid level.  He is on allopurinol  100 mg daily.  No recent flares.

## 2024-09-03 NOTE — Progress Notes (Signed)
 Chief Complaint:  John Hubbard is a 63 y.o. male who presents today for his annual comprehensive physical exam.    Assessment/Plan:  Chronic Problems Addressed Today: Type 2 diabetes mellitus with hyperglycemia (HCC) Check A1c today with labs.  He is on Mounjaro  7.5 mg daily and metformin  1000 mg twice daily.  Erectile dysfunction Did not have much improvement with Cialis .  We will switch to Viagra.  Discussed potential side effects.  If no improvement with this will need referral to urology.  Dyslipidemia associated with type 2 diabetes mellitus (HCC) Discussed lifestyle modifications.  He is on Crestor  40 mg daily and Vascepa  1 g daily.  Tolerating well.  Chronic gout involving toe without tophus Check uric acid level.  He is on allopurinol  100 mg daily.  No recent flares.  Hypertension associated with diabetes (HCC) Mildly elevated today though typically well-controlled on metoprolol  succinate 50 mg daily.  He will monitor at home and let us  know if persistently elevated.  Preventative Healthcare: Check labs.  Vaccines declined.  Patient Counseling(The following topics were reviewed and/or handout was given):  -Nutrition: Stressed importance of moderation in sodium/caffeine intake, saturated fat and cholesterol, caloric balance, sufficient intake of fresh fruits, vegetables, and fiber.  -Stressed the importance of regular exercise.   -Substance Abuse: Discussed cessation/primary prevention of tobacco, alcohol, or other drug use; driving or other dangerous activities under the influence; availability of treatment for abuse.   -Injury prevention: Discussed safety belts, safety helmets, smoke detector, smoking near bedding or upholstery.   -Sexuality: Discussed sexually transmitted diseases, partner selection, use of condoms, avoidance of unintended pregnancy and contraceptive alternatives.   -Dental health: Discussed importance of regular tooth brushing, flossing, and dental  visits.  -Health maintenance and immunizations reviewed. Please refer to Health maintenance section.  Return to care in 1 year for next preventative visit.     Subjective:  HPI:  He has no acute complaints today. Patient is here today for his annual physical.  See assessment / plan for status of chronic conditions.  Discussed the use of AI scribe software for clinical note transcription with the patient, who gave verbal consent to proceed.  History of Present Illness John Hubbard is a 63 year old male who presents for an annual physical exam.  He has been experiencing erectile dysfunction and found that Cialis , even at a 20 mg dose, was ineffective. He is considering trying an alternative medication.  He had an EKG performed yesterday, which went well. His cholesterol levels were checked during his last visit. His blood pressure was elevated yesterday, measuring 154 mmHg initially, but it decreased after a short wait. He notes that his blood pressure tends to be higher in the morning and attributes it to possible anxiety.  He has been trying to maintain a healthy diet and exercise routine but has faced challenges due to family obligations. He avoids soft drinks and sugar, and although his exercise routine has lapsed, he plans to resume it. He has a history of weightlifting and intends to join a gym to restart his exercise regimen.  He experienced shoulder pain about three to four months ago, which has since resolved. He believes light weight exercises might help prevent future issues.         09/03/2024    1:57 PM  Depression screen PHQ 2/9  Decreased Interest 0  Down, Depressed, Hopeless 0  PHQ - 2 Score 0    Health Maintenance Due  Topic Date Due  FOOT EXAM  Never done   OPHTHALMOLOGY EXAM  Never done     ROS: Per HPI, otherwise a complete review of systems was negative.   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/2022   Patient Active Problem List   Diagnosis Date Noted   Erectile dysfunction 03/05/2024   Other fatigue 11/10/2022   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/21/2022   Closed trimalleolar fracture of right ankle 06/14/2022   Pain in right foot 06/14/2022   Dyslipidemia associated with type 2 diabetes mellitus (HCC) 12/06/2021   Hypertension associated with diabetes (HCC) 12/06/2021   Type 2 diabetes mellitus with hyperglycemia (HCC) 04/17/2018   Chronic gout involving toe without tophus 04/13/2018   Bipolar disorder (HCC)    Neuropathy    Anxiety    Colon cancer (HCC) 07/31/2013   Past Surgical History:  Procedure Laterality Date   COLONOSCOPY Bilateral 07/24/2013   Procedure: COLONOSCOPY;  Surgeon: Bernarda Ned, MD;  Location: [facility];  Service: Endoscopy;  Laterality: Bilateral;   COLONOSCOPY N/A 06/14/2019   Procedure: COLONOSCOPY;  Surgeon: Ned Bernarda, MD;  Location: [facility];  Service: Endoscopy;  Laterality: N/A;   COLONOSCOPY WITH PROPOFOL  N/A 10/23/2014   Procedure: COLONOSCOPY WITH PROPOFOL ;  Surgeon: Bernarda Ned, MD;  Location: [facility];  Service: Endoscopy;  Laterality: N/A;   EUS N/A 12/15/2016   Procedure: UPPER ENDOSCOPIC ULTRASOUND (EUS) LINEAR;  Surgeon: Toribio SHAUNNA Cedar, MD;  Location: [facility];  Service: Endoscopy;  Laterality: N/A;   HERNIA REPAIR  06/19/13   Umbilical Hernia Repair   LAPAROSCOPIC PARTIAL COLECTOMY N/A 08/14/2013   Procedure: LAPAROSCOPIC PARTIAL COLECTOMY wound vac placement;   Surgeon: Bernarda Ned, MD;  Location: [facility];  Service: General;  Laterality: N/A;   MEDIASTINOSCOPY N/A 12/28/2016   Procedure: MEDIASTINOSCOPY;  Surgeon: Dallas KATHEE Jude, MD;  Location: [facility];  Service: Thoracic;  Laterality: N/A;   PORT-A-CATH REMOVAL N/A 05/14/2014   Procedure: REMOVAL PORT-A-CATH;  Surgeon: Bernarda Ned, MD;  Location: [facility];  Service: General;  Laterality: N/A;   PORTACATH PLACEMENT Left 09/16/2013   Procedure: INSERTION PORT-A-CATH;  Surgeon: Bernarda Ned, MD;  Location: [facility];  Service: General;  Laterality: Left;  dressing change on lower abdominal wound   VIDEO BRONCHOSCOPY WITH ENDOBRONCHIAL ULTRASOUND N/A 12/28/2016   Procedure: VIDEO BRONCHOSCOPY WITH ENDOBRONCHIAL ULTRASOUND;  Surgeon: Dallas KATHEE Jude, MD;  Location: [facility];  Service: Thoracic;  Laterality: N/A;    Family History  Problem Relation Age of Onset   Heart disease Father    Colon polyps Brother 56   Colon cancer Neg Hx    Esophageal cancer Neg Hx    Rectal cancer Neg Hx    Stomach cancer Neg Hx     Medications- reviewed and updated Current Outpatient Medications  Medication Sig Dispense Refill   ACCU-CHEK GUIDE TEST test strip USE 1 STRIP AS DIRECTED ONCE DAILY 100 strip 2   aspirin  EC  81 MG tablet Take 1 tablet (81 mg total) by mouth daily. Swallow whole. 90 tablet 3   blood glucose meter kit and supplies KIT Dispense based on patient and insurance preference. Use up to four times daily as directed. 1 each 0   carbamazepine  (TEGRETOL ) 200 MG tablet TAKE 2 TABLETS BY MOUTH AT BEDTIME 180 tablet 1   Coenzyme Q10 (COQ10) 100 MG CAPS Take 100 mg by mouth daily.     icosapent  Ethyl (VASCEPA ) 1 g capsule TAKE 2 CAPSULES (2 GRAM) BY MOUTH TWICE DAILY. DR. MONA RECOMMENDED AT LEAST 3-4 GRAM DAILY. 360 capsule 3   metFORMIN  (GLUCOPHAGE -XR) 500 MG 24 hr tablet TAKE 2 TABLETS (1,000 MG TOTAL) BY MOUTH 2 (TWO) TIMES DAILY WITH A MEAL. 360 tablet 3   metoprolol  succinate (TOPROL   XL) 25 MG 24 hr tablet Take 1 tablet (25 mg total) by mouth at bedtime. 90 tablet 0   Multiple Vitamins-Minerals (MULTIVITAMIN WITH MINERALS) tablet Take 1 tablet by mouth daily.     rosuvastatin  (CRESTOR ) 40 MG tablet TAKE 1 TABLET BY MOUTH EVERY DAY 90 tablet 2   sildenafil (VIAGRA) 100 MG tablet Take 0.5-1 tablets (50-100 mg total) by mouth daily as needed for erectile dysfunction. 5 tablet 11   tirzepatide  (MOUNJARO ) 10 MG/0.5ML Pen INJECT 10 MG INTO THE SKIN ONE TIME PER WEEK 2 mL 2   vitamin C (ASCORBIC ACID) 500 MG tablet Take 500 mg by mouth daily.     allopurinol  (ZYLOPRIM ) 100 MG tablet TAKE 1 TABLET BY MOUTH EVERY DAY 90 tablet 2   Current Facility-Administered Medications  Medication Dose Route Frequency Provider Last Rate Last Admin   0.9 %  sodium chloride  infusion  500 mL Intravenous Once Charlanne Groom, MD        Allergies-reviewed and updated Allergies  Allergen Reactions   Penicillins Hives and Other (See Comments)    Has patient had a PCN reaction causing immediate rash, facial/tongue/throat swelling, SOB or lightheadedness with hypotension: No  Has patient had a PCN reaction causing SEVERE RASH INVOLVING MUCUS MEMBRANES or SKIN NECROSIS: #  #  #  YES  #  #  #   Has patient had a PCN reaction that required hospitalization #  #  #  YES  #  #  #   Has patient had a PCN reaction occurring within the last 10 years: No  Other Reaction(s): Other (See Comments)  Has patient had a PCN reaction causing immediate rash, facial/tongue/throat swelling, SOB or lightheadedness with hypotension: No Has patient had a PCN reaction causing SEVERE RASH INVOLVING MUCUS MEMBRANES or SKIN NECROSIS: #  #  #  YES  #  #  #  Has patient had a PCN reaction that required hospitalization #  #  #  YES  #  #  #  Has patient had a PCN reaction occurring within the last 10 years: No    Social History   Socioeconomic History   Marital status: Married    Spouse name: Not on file   Number of  children: Not on file   Years of education: Not on file   Highest education level: Bachelor's degree (e.g., BA, AB, BS)  Occupational History   Not on file  Tobacco Use   Smoking status: Never   Smokeless tobacco: Never  Vaping Use   Vaping status: Never Used  Substance and Sexual Activity   Alcohol use: Yes    Alcohol/week: 1.0 standard drink of alcohol  Types: 1 Standard drinks or equivalent per week    Comment: occasional 1/wk   Drug use: No   Sexual activity: Yes  Other Topics Concern   Not on file  Social History Narrative   Not on file   Social Drivers of Health   Financial Resource Strain: Medium Risk (09/03/2024)   Overall Financial Resource Strain (CARDIA)    Difficulty of Paying Living Expenses: Somewhat hard  Food Insecurity: Food Insecurity Present (09/03/2024)   Hunger Vital Sign    Worried About Running Out of Food in the Last Year: Sometimes true    Ran Out of Food in the Last Year: Sometimes true  Transportation Needs: No Transportation Needs (09/03/2024)   PRAPARE - Administrator, Civil Service (Medical): No    Lack of Transportation (Non-Medical): No  Physical Activity: Insufficiently Active (09/03/2024)   Exercise Vital Sign    Days of Exercise per Week: 3 days    Minutes of Exercise per Session: 30 min  Stress: No Stress Concern Present (09/03/2024)   [employer] of Occupational Health - Occupational Stress Questionnaire    Feeling of Stress: Only a little  Social Connections: Socially Integrated (09/03/2024)   Social Connection and Isolation Panel    Frequency of Communication with Friends and Family: Three times a week    Frequency of Social Gatherings with Friends and Family: Once a week    Attends Religious Services: More than 4 times per year    Active Member of [employer] or Organizations: Yes    Attends Engineer, Structural: Not on file    Marital Status: Married        Objective:  Physical Exam: BP (!) 140/80    Pulse 80   Temp (!) 97 F (36.1 C) (Temporal)   Ht 5' 9 (1.753 m)   Wt 258 lb 14.4 oz (117.4 kg)   SpO2 96%   BMI 38.23 kg/m   Body mass index is 38.23 kg/m. Wt Readings from Last 3 Encounters:  09/03/24 258 lb 14.4 oz (117.4 kg)  09/02/24 258 lb (117 kg)  04/12/24 261 lb 6.4 oz (118.6 kg)   Gen: NAD, resting comfortably HEENT: TMs normal bilaterally. OP clear. No thyromegaly noted.  CV: RRR with no murmurs appreciated Pulm: NWOB, CTAB with no crackles, wheezes, or rhonchi GI: Normal bowel sounds present. Soft, Nontender, Nondistended. MSK: no edema, cyanosis, or clubbing noted Skin: warm, dry Neuro: CN2-12 grossly intact. Strength 5/5 in upper and lower extremities. Reflexes symmetric and intact bilaterally.  Psych: Normal affect and thought content     John Ramcharan M. Kennyth, MD 09/05/2024 1:56 PM

## 2024-09-03 NOTE — Assessment & Plan Note (Signed)
 Check A1c today with labs.  He is on Mounjaro  7.5 mg daily and metformin  1000 mg twice daily.

## 2024-09-03 NOTE — Patient Instructions (Signed)
 It was very nice to see you today!  VISIT SUMMARY: You had your annual physical exam today. We discussed your erectile dysfunction, blood pressure, and overall wellness. Blood work was ordered to check your A1c levels, and you were encouraged to resume your exercise routine.  YOUR PLAN: ADULT WELLNESS VISIT: Your blood pressure was slightly elevated, likely due to anxiety. Recent stress has impacted your exercise routine. -Blood work, including A1c, was ordered. -You were encouraged to resume your exercise routine. -We discussed the benefits of joining a gym and resistance training.  MALE ERECTILE DYSFUNCTION: Your erectile dysfunction is unresponsive to Cialis . -Viagra was prescribed. -Report its effectiveness via MyChart. -A referral to a urologist will be considered if Viagra is ineffective. -Non-pill options were also considered.  HYPERTENSION: Your blood pressure was slightly elevated, possibly due to anxiety. -Your blood pressure was rechecked before you left.  TYPE 2 DIABETES MELLITUS: We will assess your glycemic control with an A1c test. -A1c test was ordered.  HYPERLIPIDEMIA: Your recent cholesterol levels are normal. -Current management will continue.  Return in about 1 year (around 09/03/2025) for Annual Physical.   Take care, Dr Kennyth  PLEASE NOTE:  If you had any lab tests, please let us  know if you have not heard back within a few days. You may see your results on mychart before we have a chance to review them but we will give you a call once they are reviewed by us .   If we ordered any referrals today, please let us  know if you have not heard from their office within the next week.   If you had any urgent prescriptions sent in today, please check with the pharmacy within an hour of our visit to make sure the prescription was transmitted appropriately.   Please try these tips to maintain a healthy lifestyle:  Eat at least 3 REAL meals and 1-2 snacks per day.   Aim for no more than 5 hours between eating.  If you eat breakfast, please do so within one hour of getting up.   Each meal should contain half fruits/vegetables, one quarter protein, and one quarter carbs (no bigger than a computer mouse)  Cut down on sweet beverages. This includes juice, soda, and sweet tea.   Drink at least 1 glass of water with each meal and aim for at least 8 glasses per day  Exercise at least 150 minutes every week.    Preventive Care 62 Years and Older, Male Preventive care refers to lifestyle choices and visits with your health care provider that can promote health and wellness. Preventive care visits are also called wellness exams. What can I expect for my preventive care visit? Counseling During your preventive care visit, your health care provider may ask about your: Medical history, including: Past medical problems. Family medical history. History of falls. Current health, including: Emotional well-being. Home life and relationship well-being. Sexual activity. Memory and ability to understand (cognition). Lifestyle, including: Alcohol, nicotine or tobacco, and drug use. Access to firearms. Diet, exercise, and sleep habits. Work and work astronomer. Sunscreen use. Safety issues such as seatbelt and bike helmet use. Physical exam Your health care provider will check your: Height and weight. These may be used to calculate your BMI (body mass index). BMI is a measurement that tells if you are at a healthy weight. Waist circumference. This measures the distance around your waistline. This measurement also tells if you are at a healthy weight and may help predict your risk of  certain diseases, such as type 2 diabetes and high blood pressure. Heart rate and blood pressure. Body temperature. Skin for abnormal spots. What immunizations do I need?  Vaccines are usually given at various ages, according to a schedule. Your health care provider will recommend  vaccines for you based on your age, medical history, and lifestyle or other factors, such as travel or where you work. What tests do I need? Screening Your health care provider may recommend screening tests for certain conditions. This may include: Lipid and cholesterol levels. Diabetes screening. This is done by checking your blood sugar (glucose) after you have not eaten for a while (fasting). Hepatitis C test. Hepatitis B test. HIV (human immunodeficiency virus) test. STI (sexually transmitted infection) testing, if you are at risk. Lung cancer screening. Colorectal cancer screening. Prostate cancer screening. Abdominal aortic aneurysm (AAA) screening. You may need this if you are a current or former smoker. Talk with your health care provider about your test results, treatment options, and if necessary, the need for more tests. Follow these instructions at home: Eating and drinking  Eat a diet that includes fresh fruits and vegetables, whole grains, lean protein, and low-fat dairy products. Limit your intake of foods with high amounts of sugar, saturated fats, and salt. Take vitamin and mineral supplements as recommended by your health care provider. Do not drink alcohol if your health care provider tells you not to drink. If you drink alcohol: Limit how much you have to 0-2 drinks a day. Know how much alcohol is in your drink. In the [place] one drink equals one 12 oz bottle of beer (355 mL), one 5 oz glass of wine (148 mL), or one 1 oz glass of hard liquor (44 mL). Lifestyle Brush your teeth every morning and night with fluoride toothpaste. Floss one time each day. Exercise for at least 30 minutes 5 or more days each week. Do not use any products that contain nicotine or tobacco. These products include cigarettes, chewing tobacco, and vaping devices, such as e-cigarettes. If you need help quitting, ask your health care provider. Do not use drugs. If you are sexually active, practice  safe sex. Use a condom or other form of protection to prevent STIs. Take aspirin  only as told by your health care provider. Make sure that you understand how much to take and what form to take. Work with your health care provider to find out whether it is safe and beneficial for you to take aspirin  daily. Ask your health care provider if you need to take a cholesterol-lowering medicine (statin). Find healthy ways to manage stress, such as: Meditation, yoga, or listening to music. Journaling. Talking to a trusted person. Spending time with friends and family. Safety Always wear your seat belt while driving or riding in a vehicle. Do not drive: If you have been drinking alcohol. Do not ride with someone who has been drinking. When you are tired or distracted. While texting. If you have been using any mind-altering substances or drugs. Wear a helmet and other protective equipment during sports activities. If you have firearms in your house, make sure you follow all gun safety procedures. Minimize exposure to UV radiation to reduce your risk of skin cancer. What's next? Visit your health care provider once a year for an annual wellness visit. Ask your health care provider how often you should have your eyes and teeth checked. Stay up to date on all vaccines. This information is not intended to replace advice given to  you by your health care provider. Make sure you discuss any questions you have with your health care provider. Document Revised: 03/17/2021 Document Reviewed: 03/17/2021 Elsevier Patient Education  2024 [employer].

## 2024-09-03 NOTE — Assessment & Plan Note (Signed)
 Mildly elevated today though typically well-controlled on metoprolol  succinate 50 mg daily.  He will monitor at home and let us  know if persistently elevated.

## 2024-09-04 ENCOUNTER — Other Ambulatory Visit: Payer: Self-pay | Admitting: Family Medicine

## 2024-09-05 ENCOUNTER — Ambulatory Visit: Payer: Self-pay | Admitting: Family Medicine

## 2024-09-05 NOTE — Progress Notes (Signed)
 A1c is 7.6.  This is better than last time though still a little higher than goal.  We should recheck this in 3 to 6 months.  The rest of his labs are all at goal and we can recheck in a year.

## 2024-09-21 ENCOUNTER — Other Ambulatory Visit: Payer: Self-pay | Admitting: Family Medicine

## 2024-09-28 ENCOUNTER — Other Ambulatory Visit: Payer: Self-pay | Admitting: Internal Medicine

## 2024-09-30 ENCOUNTER — Other Ambulatory Visit: Payer: Self-pay

## 2024-10-02 MED ORDER — ROSUVASTATIN CALCIUM 40 MG PO TABS: 40.0000 mg | ORAL_TABLET | Freq: Every day | ORAL | 3 refills | Status: AC
